# Patient Record
Sex: Male | Born: 1965 | Race: Black or African American | Hispanic: No | Marital: Single | Smoking: Former smoker
Health system: Southern US, Community
[De-identification: ages and names within clinical notes are randomized; demographics above are authoritative.]

## PROBLEM LIST (undated history)

## (undated) DIAGNOSIS — N400 Enlarged prostate without lower urinary tract symptoms: Secondary | ICD-10-CM

## (undated) DIAGNOSIS — I82409 Acute embolism and thrombosis of unspecified deep veins of unspecified lower extremity: Secondary | ICD-10-CM

## (undated) DIAGNOSIS — E7800 Pure hypercholesterolemia, unspecified: Secondary | ICD-10-CM

## (undated) DIAGNOSIS — I2699 Other pulmonary embolism without acute cor pulmonale: Secondary | ICD-10-CM

## (undated) DIAGNOSIS — R7303 Prediabetes: Secondary | ICD-10-CM

## (undated) DIAGNOSIS — I10 Essential (primary) hypertension: Secondary | ICD-10-CM

## (undated) DIAGNOSIS — G8929 Other chronic pain: Secondary | ICD-10-CM

## (undated) HISTORY — DX: Prediabetes: R73.03

## (undated) HISTORY — PX: HERNIA REPAIR: SHX51

## (undated) HISTORY — DX: Essential (primary) hypertension: I10

## (undated) HISTORY — PX: AMPUTATION TOE: SHX6595

## (undated) HISTORY — PX: APPENDECTOMY: SHX54

---

## 1999-06-07 ENCOUNTER — Ambulatory Visit: Admission: RE | Admit: 1999-06-07 | Discharge: 1999-06-07 | Payer: Self-pay | Admitting: Occupational Medicine

## 1999-06-07 ENCOUNTER — Encounter: Payer: Self-pay | Admitting: Occupational Medicine

## 2009-12-01 ENCOUNTER — Emergency Department (HOSPITAL_COMMUNITY): Admission: EM | Admit: 2009-12-01 | Discharge: 2009-12-01 | Payer: Self-pay | Admitting: Emergency Medicine

## 2010-04-28 ENCOUNTER — Emergency Department (HOSPITAL_COMMUNITY): Admission: EM | Admit: 2010-04-28 | Discharge: 2010-04-28 | Payer: Self-pay | Admitting: Emergency Medicine

## 2011-05-13 ENCOUNTER — Encounter: Payer: Self-pay | Admitting: *Deleted

## 2011-05-13 ENCOUNTER — Emergency Department (HOSPITAL_COMMUNITY)
Admission: EM | Admit: 2011-05-13 | Discharge: 2011-05-13 | Disposition: A | Discharge status: Home / Self Care | Payer: Self-pay | Attending: Emergency Medicine | Admitting: Emergency Medicine

## 2011-05-13 DIAGNOSIS — K921 Melena: Secondary | ICD-10-CM | POA: Insufficient documentation

## 2011-05-13 DIAGNOSIS — K625 Hemorrhage of anus and rectum: Secondary | ICD-10-CM | POA: Insufficient documentation

## 2011-05-13 LAB — OCCULT BLOOD, POC DEVICE: Fecal Occult Bld: NEGATIVE

## 2011-05-13 LAB — CBC
HCT: 42.8 % (ref 39.0–52.0)
Hemoglobin: 14.4 g/dL (ref 13.0–17.0)
MCH: 30.6 pg (ref 26.0–34.0)
MCHC: 33.6 g/dL (ref 30.0–36.0)
MCV: 90.9 fL (ref 78.0–100.0)
Platelets: 163 10*3/uL (ref 150–400)
RBC: 4.71 MIL/uL (ref 4.22–5.81)
RDW: 13.2 % (ref 11.5–15.5)
WBC: 7.9 10*3/uL (ref 4.0–10.5)

## 2011-05-13 MED ORDER — FENTANYL CITRATE 0.05 MG/ML IJ SOLN
INTRAMUSCULAR | Status: AC
  Filled 2011-05-13: qty 2

## 2011-05-13 NOTE — ED Notes (Signed)
To ed for eval of rectal bleeding since Saturday. Bright red blood in toilet past BM. Feeling of tightness in abd.

## 2011-05-13 NOTE — ED Provider Notes (Signed)
History     CSN: [id number] Arrival date & time: 05/13/2011  1:00 PM   First MD Initiated Contact with Patient 05/13/11 1357      Chief Complaint  Patient presents with  . Rectal Bleeding    (Consider location/radiation/quality/duration/timing/severity/associated sxs/prior treatment) HPI Rectal bleeding 2 days ago. Initially blood on toilet paper. Blood mixed with brown stool yesterday. No rectal bleeding noted today. Patient denies any abdominal pain no lightheadedness no vomiting no rectal pain no other complaint no other associated symptoms no treatment prior to coming here presently asymptomatic History reviewed. No pertinent past medical history.  Past Surgical History  Procedure Date  . Appendectomy   . Hernia repair     History reviewed. No pertinent family history.  History  Substance Use Topics  . Smoking status: Current Everyday Smoker -- 0.5 packs/day    Types: Cigarettes  . Smokeless tobacco: Not on file  . Alcohol Use: No      Review of Systems  Constitutional: Negative.   HENT: Negative.   Respiratory: Negative.   Cardiovascular: Negative.   Gastrointestinal: Positive for blood in stool.  Musculoskeletal: Negative.   Skin: Negative.   Neurological: Negative.   Hematological: Negative.   Psychiatric/Behavioral: Negative.   All other systems reviewed and are negative.    Allergies  Review of patient's allergies indicates no known allergies.  Home Medications  No current outpatient prescriptions on file.  BP 119/62  Pulse 84  Temp(Src) 97.8 F (36.6 C) (Oral)  Resp 16  SpO2 99%  Physical Exam  Nursing note and vitals reviewed. Genitourinary: Rectum normal, prostate normal and penis normal. Guaiac negative stool.    ED Course  Procedures (including critical care time)   Labs Reviewed  POCT OCCULT BLOOD STOOL, DEVICE  CBC   No results found.   No diagnosis found.    MDM  Pt stable for d/c and out pt workup of rectal  bleeding Plan Referral  GI   Dx rectal bleeding    Doug Sou, MD 05/13/11 [phone]

## 2011-05-13 NOTE — ED Notes (Signed)
Pt states he had rectal bleeding 1 year prior but did not see PMD for eval of bleeding. Pt states since Saturday has been having bright red bloody stools. Denies hx of bleeding dx or hemmorhoids. Last meal was this AM. Denies N/V. Pts abdomen soft with positive bowel sounds and diffuse tenderness to palpation.

## 2011-08-16 ENCOUNTER — Emergency Department (HOSPITAL_COMMUNITY)
Admission: EM | Admit: 2011-08-16 | Discharge: 2011-08-16 | Disposition: A | Discharge status: Home Health | Payer: Self-pay | Attending: Emergency Medicine | Admitting: Emergency Medicine

## 2011-08-16 ENCOUNTER — Encounter (HOSPITAL_COMMUNITY): Payer: Self-pay | Admitting: Emergency Medicine

## 2011-08-16 DIAGNOSIS — M5431 Sciatica, right side: Secondary | ICD-10-CM

## 2011-08-16 DIAGNOSIS — M545 Low back pain, unspecified: Secondary | ICD-10-CM | POA: Insufficient documentation

## 2011-08-16 DIAGNOSIS — M5430 Sciatica, unspecified side: Secondary | ICD-10-CM | POA: Insufficient documentation

## 2011-08-16 DIAGNOSIS — F17200 Nicotine dependence, unspecified, uncomplicated: Secondary | ICD-10-CM | POA: Insufficient documentation

## 2011-08-16 DIAGNOSIS — M79609 Pain in unspecified limb: Secondary | ICD-10-CM | POA: Insufficient documentation

## 2011-08-16 MED ORDER — PREDNISONE 20 MG PO TABS: 40.0000 mg | ORAL_TABLET | Freq: Every day | ORAL | Status: AC

## 2011-08-16 MED ORDER — HYDROMORPHONE HCL PF 1 MG/ML IJ SOLN
1.0000 mg | Freq: Once | INTRAMUSCULAR | Status: AC
  Administered 2011-08-16: 1 mg via INTRAVENOUS
  Filled 2011-08-16: qty 1

## 2011-08-16 MED ORDER — DIAZEPAM 5 MG PO TABS: 5.0000 mg | ORAL_TABLET | Freq: Three times a day (TID) | ORAL | Status: AC | PRN

## 2011-08-16 MED ORDER — PREDNISONE 20 MG PO TABS
60.0000 mg | ORAL_TABLET | Freq: Once | ORAL | Status: AC
  Administered 2011-08-16: 60 mg via ORAL
  Filled 2011-08-16: qty 3

## 2011-08-16 MED ORDER — DIAZEPAM 5 MG PO TABS
5.0000 mg | ORAL_TABLET | Freq: Once | ORAL | Status: AC
  Administered 2011-08-16: 5 mg via ORAL
  Filled 2011-08-16: qty 1

## 2011-08-16 MED ORDER — HYDROCODONE-ACETAMINOPHEN 5-325 MG PO TABS: 1.0000 | ORAL_TABLET | ORAL | Status: AC | PRN

## 2011-08-16 NOTE — ED Notes (Signed)
Patient with back pain since yesterday am trying to get out of bed.  Patient is slow to walk and move.  Patient states he has had this before, 2 years ago.

## 2011-08-16 NOTE — ED Provider Notes (Signed)
History     CSN: [id number]  Arrival date & time 08/16/11  2026   First MD Initiated Contact with Patient 08/16/11 2110      Chief Complaint  Patient presents with  . Back Pain     Patient is a 46 y.o. male presenting with back pain. The history is provided by the patient.  Back Pain  This is a recurrent problem. The current episode started 12 to 24 hours ago. The problem occurs constantly. The problem has been gradually worsening. The pain is associated with no known injury. The pain is present in the lumbar spine. The quality of the pain is described as stabbing and shooting. The pain radiates to the right thigh. The pain is at a severity of 9/10. The pain is severe. The symptoms are aggravated by certain positions. The pain is the same all the time. Associated symptoms include leg pain. Pertinent negatives include no fever, no numbness, no abdominal pain, no bowel incontinence, no perianal numbness, no bladder incontinence, no dysuria, no paresthesias, no paresis, no tingling and no weakness.   patient onset of sharp right lower quadrant back pain this morning when he attempted to get out of bed. Pain radiates into the right posterior thigh to the level of the right knee. States pain is much more severe with any attempted movement particularly twisting and bending motions. Denies numbness or tingling or weakness of the right lower extremity. Denies recent injury. Admits he had this same type of pain a couple of years ago that resolved after a week or 10 days.  History reviewed. No pertinent past medical history.  Past Surgical History  Procedure Date  . Appendectomy   . Hernia repair     History reviewed. No pertinent family history.  History  Substance Use Topics  . Smoking status: Current Everyday Smoker -- 0.5 packs/day    Types: Cigarettes  . Smokeless tobacco: Not on file  . Alcohol Use: No      Review of Systems  Constitutional: Negative.  Negative for fever.  HENT:  Negative.   Eyes: Negative.   Respiratory: Negative.   Cardiovascular: Negative.   Gastrointestinal: Negative.  Negative for abdominal pain and bowel incontinence.  Genitourinary: Negative.  Negative for bladder incontinence and dysuria.  Musculoskeletal: Positive for back pain.  Skin: Negative.   Neurological: Negative.  Negative for tingling, weakness, numbness and paresthesias.  Hematological: Negative.   Psychiatric/Behavioral: Negative.     Allergies  Review of patient's allergies indicates no known allergies.  Home Medications  No current outpatient prescriptions on file.  BP 120/83  Pulse 74  Temp(Src) 97.7 F (36.5 C) (Oral)  Resp 16  SpO2 95%  Physical Exam  Constitutional: He is oriented to person, place, and time. He appears well-developed and well-nourished.  HENT:  Head: Normocephalic and atraumatic.  Eyes: Conjunctivae are normal.  Neck: Neck supple.  Cardiovascular: Normal rate and regular rhythm.   Pulmonary/Chest: Effort normal and breath sounds normal.  Abdominal: Soft. Bowel sounds are normal.  Musculoskeletal: Normal range of motion.       Right upper leg: He exhibits tenderness.       Legs: Neurological: He is alert and oriented to person, place, and time.  Skin: Skin is warm and dry.  Psychiatric: He has a normal mood and affect.    ED Course  Procedures   Patient reports complete relief of right lower back pain after IM and PO medications. Will plan for discharge home on prednisone, provide  a short course of muscle relaxants and medication for pain and encourage followup with PCP. Referrals provided.  Labs Reviewed - No data to display No results found.   No diagnosis found.    MDM  HPI/PE and clinical findings c/w 1. Sciatica (R) (no focal neurological findings to suggest cauda-equina syndrome)        Leanne Chang, NP 08/16/11 2234

## 2011-08-16 NOTE — Discharge Instructions (Signed)
Please read over the instructions below. Take the prednisone as directed and be sure to complete it. Take a muscle relaxer and medication for pain as directed. Avoid strenuous activity or heavy lifting of anything over 10-15 pounds for the next several days. He may benefit from cold packs or heating pad to the areas of discomfort. Followup with primary care physician (PCP) as soon as can be arranged. We have provided the referrals as discussed. See the "[facility]" number above, resource list below as well as the attached referral to assist you in getting established with a PCP.   Sciatica Sciatica is a name for lower back pain caused by pressure on the sciatic nerve. The back pain can spread to the buttocks and back of the leg. HOME CARE   Rest as much as possible.   Only take medicine as told by your doctor.   Apply cold or heat to your back as told by your doctor.   Do not bend, lift, or sit for a long time until your pain is better.   Do not do anything that makes the condition worse.   Start normal activity when the pain is better.   Keep all follow-up visits.  GET HELP RIGHT AWAY IF:   There is more pain.   There is weakness or numbness in the legs.   You cannot control when you poop (bowel movement) or pee (urinate).  MAKE SURE YOU:   Understand these instructions.   Will watch this condition.   Will get help right away if you are not doing well or get worse.  Document Released: 03/19/2008 Document Revised: 02/20/2011 Document Reviewed: 03/19/2008 [facility] Patient Information 2012 [place], [place].  RESOURCE GUIDE  Dental Problems  Patients with Medicaid: [facility] [id number]                                           [phone] [address] Phone:  [phone]                                                  Phone:  [phone]  If unable to pay or uninsured, contact:  [facility] or [facility]. to become qualified for the adult dental clinic.  Chronic Pain Problems Contact [facility]  [phone] Patients need to be referred by their primary care doctor.  Insufficient Money for Medicine Contact [employer]:  call "211" or [facility] [phone].  No Primary Care Doctor Call [facility]  [phone] Other agencies that provide inexpensive medical care    [facility]  [phone]    [facility]  [phone]    [facility]  [phone]    [facility]  [phone]    [facility]  [phone]    [facility]  [phone]  Psychological Services [facility]  [phone] [facility]  [phone] [facility] Mental [facility]   [phone] (emergency services [phone])  Substance Abuse Resources [facility]  [phone] [facility] [phone] [facility] [phone] [facility]  [phone] Residential & Outpatient Substance Abuse Program  [phone]  Abuse/Neglect [facility] Child Abuse Hotline 817-451-1696 [facility] Child Abuse Hotline (504)767-0095 (After Hours)  Emergency Shelter [facility] 9803687179  M[facility] [facility] [phone] [facility] [phone]  MRSA Hotline #:   226 428 9636    [facility] Resources  [facility]     [employer]                          [facility] Dept. [address] [place]                       [address]      [address]  Blondell Reveal Phone:  [phone]                                   Phone:  [phone]                 Phone:  [phone]  [facility] Phone:  [phone]  [facility] Child Abuse Hotline (301)170-7140 567-130-3239 (After  Hours)

## 2011-08-16 NOTE — ED Notes (Signed)
Pt ambulatory at discharge without difficulty.  Pain now rated at 2/10

## 2011-08-17 NOTE — ED Provider Notes (Signed)
Medical screening examination/treatment/procedure(s) were performed by non-physician practitioner and as supervising physician I was immediately available for consultation/collaboration. Devoria Albe, MD, FACEP   Ward Givens, MD 08/17/11 1330

## 2011-08-17 NOTE — ED Notes (Signed)
Pt reports that he has had a"huge relief from pain" after medication administration.  He denies N/V.  Currently awaiting results of all diagnostic testing.  Pt given ginger ale

## 2016-06-06 DIAGNOSIS — M2061 Acquired deformities of toe(s), unspecified, right foot: Secondary | ICD-10-CM | POA: Insufficient documentation

## 2016-06-06 DIAGNOSIS — G8929 Other chronic pain: Secondary | ICD-10-CM | POA: Insufficient documentation

## 2016-07-14 DIAGNOSIS — M205X1 Other deformities of toe(s) (acquired), right foot: Secondary | ICD-10-CM | POA: Insufficient documentation

## 2017-03-27 ENCOUNTER — Encounter (HOSPITAL_COMMUNITY): Payer: Self-pay | Admitting: Emergency Medicine

## 2017-03-27 ENCOUNTER — Emergency Department (HOSPITAL_COMMUNITY)
Admission: EM | Admit: 2017-03-27 | Discharge: 2017-03-28 | Disposition: A | Discharge status: Home / Self Care | Payer: Self-pay | Attending: Emergency Medicine | Admitting: Emergency Medicine

## 2017-03-27 DIAGNOSIS — K0889 Other specified disorders of teeth and supporting structures: Secondary | ICD-10-CM

## 2017-03-27 DIAGNOSIS — F17210 Nicotine dependence, cigarettes, uncomplicated: Secondary | ICD-10-CM | POA: Insufficient documentation

## 2017-03-27 MED ORDER — OXYCODONE-ACETAMINOPHEN 5-325 MG PO TABS
1.0000 | ORAL_TABLET | Freq: Once | ORAL | Status: AC
Start: 2017-03-27 — End: 2017-03-27
  Administered 2017-03-27: 1 via ORAL

## 2017-03-27 MED ORDER — PENICILLIN V POTASSIUM 500 MG PO TABS: 500.0000 mg | ORAL_TABLET | Freq: Three times a day (TID) | ORAL | 0 refills | Status: DC

## 2017-03-27 MED ORDER — OXYCODONE-ACETAMINOPHEN 5-325 MG PO TABS
ORAL_TABLET | ORAL | Status: AC
  Filled 2017-03-27: qty 1

## 2017-03-27 MED ORDER — PENICILLIN V POTASSIUM 250 MG PO TABS
500.0000 mg | ORAL_TABLET | Freq: Once | ORAL | Status: AC
  Administered 2017-03-27: 500 mg via ORAL
  Filled 2017-03-27: qty 2

## 2017-03-27 MED ORDER — TRAMADOL HCL 50 MG PO TABS: 50.0000 mg | ORAL_TABLET | Freq: Four times a day (QID) | ORAL | 0 refills | Status: DC | PRN

## 2017-03-27 NOTE — Discharge Instructions (Signed)
Please read and follow all provided instructions.  Your diagnoses today include:  1. Toothache     The exam and treatment you received today has been provided on an emergency basis only. This is not a substitute for complete medical or dental care.  Tests performed today include:  Vital signs. See below for your results today.   Medications prescribed:   Penicillin - antibiotic  You have been prescribed an antibiotic medicine: take the entire course of medicine even if you are feeling better. Stopping early can cause the antibiotic not to work.   Tramadol - narcotic-like pain medication  DO NOT drive or perform any activities that require you to be awake and alert because this medicine can make you drowsy.   Take any prescribed medications only as directed.  Home care instructions:  Follow any educational materials contained in this packet.  Follow-up instructions: Please follow-up with your dentist for further evaluation of your symptoms.   Dental Assistance: See attached dental referrals  Return instructions:   Please return to the Emergency Department if you experience worsening symptoms.  Please return if you develop a fever, you develop more swelling in your face or neck, you have trouble breathing or swallowing food.  Please return if you have any other emergent concerns.  Additional Information:  Your vital signs today were: BP 137/87 (BP Location: Left Arm)    Pulse 78    Temp 99 F (37.2 C) (Oral)    Resp 18    SpO2 96%  If your blood pressure (BP) was elevated above 135/85 this visit, please have this repeated by your doctor within one month. --------------

## 2017-03-27 NOTE — ED Triage Notes (Signed)
Patient reports left lower molar pain for 2 weeks unrelieved by OTC pain medications .

## 2017-03-27 NOTE — ED Provider Notes (Signed)
[facility]EM[facility]ENCY DEPT Provider Note   CSN: [id number] Arrival date & time: 03/27/17  2109     History   Chief Complaint Chief Complaint  Patient presents with  . Dental Pain    HPI Keith Potter is a 51 y.o. male.  Patient presents with left-sided lower dental pain for the past 2 weeks, intermittent at first, now more constant with associated subjective swelling. Pain began gradually. He has been using over-the-counter medications without relief. No neck swelling or difficulty breathing. Patient does not have a dentist.      History reviewed. No pertinent past medical history.  There are no active problems to display for this patient.   Past Surgical History:  Procedure Laterality Date  . APPENDECTOMY    . HERNIA REPAIR         Home Medications    Prior to Admission medications   Medication Sig Start Date End Date Taking? Authorizing Provider  penicillin v potassium (VEETID) 500 MG tablet Take 1 tablet (500 mg total) by mouth 3 (three) times daily. 03/27/17   Renne Crigler, PA-C  traMADol (ULTRAM) 50 MG tablet Take 1 tablet (50 mg total) by mouth every 6 (six) hours as needed. 03/27/17   Renne Crigler, PA-C    Family History No family history on file.  Social History Social History  Substance Use Topics  . Smoking status: Current Every Day Smoker    Packs/day: 0.50    Types: Cigarettes  . Smokeless tobacco: Never Used  . Alcohol use No     Allergies   Patient has no known allergies.   Review of Systems Review of Systems  Constitutional: Negative for fever.  HENT: Positive for dental problem and facial swelling. Negative for ear pain, sore throat and trouble swallowing.   Respiratory: Negative for shortness of breath and stridor.   Musculoskeletal: Negative for neck pain.  Skin: Negative for color change.  Neurological: Negative for headaches.     Physical Exam Updated Vital Signs BP 137/87 (BP Location: Left Arm)   Pulse 78   Temp 99 F  (37.2 C) (Oral)   Resp 18   SpO2 96%   Physical Exam  Constitutional: He appears well-developed and well-nourished.  HENT:  Head: Normocephalic and atraumatic.  Right Ear: Tympanic membrane, external ear and ear canal normal.  Left Ear: Tympanic membrane, external ear and ear canal normal.  Nose: Nose normal.  Mouth/Throat: Uvula is midline, oropharynx is clear and moist and mucous membranes are normal. No trismus in the jaw. Abnormal dentition. Dental caries present. No dental abscesses or uvula swelling. No tonsillar abscesses.  Patient with advanced periodontal disease with multiple missing teeth. Tooth #18 is broken. There is no gross abscess or swelling noted.  Eyes: Pupils are equal, round, and reactive to light.  Neck: Normal range of motion. Neck supple.  No neck swelling or Lugwig's angina  Neurological: He is alert.  Skin: Skin is warm and dry.  Psychiatric: He has a normal mood and affect.  Nursing note and vitals reviewed.    ED Treatments / Results  Labs (all labs ordered are listed, but only abnormal results are displayed) Labs Reviewed - No data to display  EKG  EKG Interpretation None       Radiology No results found.  Procedures Procedures (including critical care time)  Medications Ordered in ED Medications  oxyCODONE-acetaminophen (PERCOCET/ROXICET) 5-325 MG per tablet (not administered)  penicillin v potassium (VEETID) tablet 500 mg (not administered)  oxyCODONE-acetaminophen (PERCOCET/ROXICET) 5-325 MG per  tablet 1 tablet (1 tablet Oral Given 03/27/17 2126)     Initial Impression / Assessment and Plan / ED Course  I have reviewed the triage vital signs and the nursing notes.  Pertinent labs & imaging results that were available during my care of the patient were reviewed by me and considered in my medical decision making (see chart for details).     11:16 PM Patient seen and examined. Medications ordered.   Vital signs reviewed and are as  follows: BP 137/87 (BP Location: Left Arm)   Pulse 78   Temp 99 F (37.2 C) (Oral)   Resp 18   SpO2 96%   Patient counseled on use of narcotic pain medications. rged not to drink alcohol, drive, or perform any other activities that requires focus while taking these medications. The patient verbalizes understanding and agrees with the plan.  Patient counseled to take prescribed medications as directed, return with worsening facial or neck swelling, and to follow-up with their dentist as soon as possible.    Final Clinical Impressions(s) / ED Diagnoses   Final diagnoses:  Toothache   Patient with toothache. No fever. Exam unconcerning for Ludwig's angina or other deep tissue infection in neck.   Will treat with antibiotic and pain medicine. Urged patient to follow-up with dentist.      New Prescriptions New Prescriptions   PENICILLIN V POTASSIUM (VEETID) 500 MG TABLET    Take 1 tablet (500 mg total) by mouth 3 (three) times daily.   TRAMADOL (ULTRAM) 50 MG TABLET    Take 1 tablet (50 mg total) by mouth every 6 (six) hours as needed.     Renne Crigler, PA-C 03/27/17 2316    Arby Barrette, MD 03/27/17 [phone]

## 2017-04-29 ENCOUNTER — Encounter: Payer: Self-pay | Admitting: Pediatric Intensive Care

## 2017-05-22 NOTE — Congregational Nurse Program (Signed)
Congregational Nurse Program Note  Date of Encounter: 04/29/2017  Past Medical History: No past medical history on file.  Encounter Details: CNP Questionnaire - 04/29/17 1000      Questionnaire   Patient Status  Not Applicable    Race  Black or African American    Location Patient Served At  [facility]    Uninsured  Uninsured (NEW 1x/quarter)    Food  No food insecurities    Housing/Utilities  No permanent housing    Transportation  Yes, need transportation assistance;Within past 12 months, lack of transportation negatively impacted life    Interpersonal Safety  Yes, feel physically and emotionally safe where you currently live    Medication  No medication insecurities    Medical Provider  Yes    Referrals  Not Applicable    ED Visit Averted  Not Applicable    Life-Saving Intervention Made  Not Applicable       New client- 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 He states he is pending disability. Client requests an orange sticker so he can stay in day room. CN states that she will make request to shelter staff.

## 2018-12-28 ENCOUNTER — Emergency Department (HOSPITAL_BASED_OUTPATIENT_CLINIC_OR_DEPARTMENT_OTHER): Payer: Self-pay

## 2018-12-28 ENCOUNTER — Emergency Department (HOSPITAL_COMMUNITY): Payer: Self-pay

## 2018-12-28 ENCOUNTER — Other Ambulatory Visit: Payer: Self-pay

## 2018-12-28 ENCOUNTER — Observation Stay (HOSPITAL_COMMUNITY)
Admission: EM | Admit: 2018-12-28 | Discharge: 2018-12-29 | Disposition: A | Discharge status: Home / Self Care | Payer: Self-pay | Attending: Internal Medicine | Admitting: Internal Medicine

## 2018-12-28 ENCOUNTER — Encounter (HOSPITAL_COMMUNITY): Payer: Self-pay | Admitting: *Deleted

## 2018-12-28 DIAGNOSIS — Z833 Family history of diabetes mellitus: Secondary | ICD-10-CM | POA: Insufficient documentation

## 2018-12-28 DIAGNOSIS — I82401 Acute embolism and thrombosis of unspecified deep veins of right lower extremity: Secondary | ICD-10-CM | POA: Insufficient documentation

## 2018-12-28 DIAGNOSIS — D696 Thrombocytopenia, unspecified: Secondary | ICD-10-CM

## 2018-12-28 DIAGNOSIS — R52 Pain, unspecified: Secondary | ICD-10-CM

## 2018-12-28 DIAGNOSIS — D72829 Elevated white blood cell count, unspecified: Secondary | ICD-10-CM

## 2018-12-28 DIAGNOSIS — Z1159 Encounter for screening for other viral diseases: Secondary | ICD-10-CM | POA: Insufficient documentation

## 2018-12-28 DIAGNOSIS — Z8249 Family history of ischemic heart disease and other diseases of the circulatory system: Secondary | ICD-10-CM | POA: Insufficient documentation

## 2018-12-28 DIAGNOSIS — Z79899 Other long term (current) drug therapy: Secondary | ICD-10-CM | POA: Insufficient documentation

## 2018-12-28 DIAGNOSIS — R7303 Prediabetes: Secondary | ICD-10-CM | POA: Insufficient documentation

## 2018-12-28 DIAGNOSIS — Z89411 Acquired absence of right great toe: Secondary | ICD-10-CM

## 2018-12-28 DIAGNOSIS — I2699 Other pulmonary embolism without acute cor pulmonale: Principal | ICD-10-CM | POA: Insufficient documentation

## 2018-12-28 DIAGNOSIS — Z560 Unemployment, unspecified: Secondary | ICD-10-CM | POA: Insufficient documentation

## 2018-12-28 DIAGNOSIS — Z9889 Other specified postprocedural states: Secondary | ICD-10-CM

## 2018-12-28 DIAGNOSIS — F17210 Nicotine dependence, cigarettes, uncomplicated: Secondary | ICD-10-CM | POA: Insufficient documentation

## 2018-12-28 DIAGNOSIS — M7989 Other specified soft tissue disorders: Secondary | ICD-10-CM

## 2018-12-28 DIAGNOSIS — Z86718 Personal history of other venous thrombosis and embolism: Secondary | ICD-10-CM | POA: Insufficient documentation

## 2018-12-28 DIAGNOSIS — E785 Hyperlipidemia, unspecified: Secondary | ICD-10-CM | POA: Insufficient documentation

## 2018-12-28 DIAGNOSIS — Z89421 Acquired absence of other right toe(s): Secondary | ICD-10-CM

## 2018-12-28 LAB — LIPID PANEL
Cholesterol: 214 mg/dL — ABNORMAL HIGH (ref 0–200)
HDL: 39 mg/dL — ABNORMAL LOW (ref >40)
LDL Cholesterol: 160 mg/dL — ABNORMAL HIGH (ref 0–99)
Total CHOL/HDL Ratio: 5.5 RATIO
Triglycerides: 75 mg/dL (ref <150)
VLDL: 15 mg/dL (ref 0–40)

## 2018-12-28 LAB — CBC WITH DIFFERENTIAL/PLATELET
Abs Immature Granulocytes: 0.05 10*3/uL (ref 0.00–0.07)
Basophils Absolute: 0.1 10*3/uL (ref 0.0–0.1)
Basophils Relative: 0 %
Eosinophils Absolute: 0.3 10*3/uL (ref 0.0–0.5)
Eosinophils Relative: 2 %
HCT: 44.5 % (ref 39.0–52.0)
Hemoglobin: 14.8 g/dL (ref 13.0–17.0)
Immature Granulocytes: 0 %
Lymphocytes Relative: 24 %
Lymphs Abs: 2.9 10*3/uL (ref 0.7–4.0)
MCH: 31.4 pg (ref 26.0–34.0)
MCHC: 33.3 g/dL (ref 30.0–36.0)
MCV: 94.3 fL (ref 80.0–100.0)
Monocytes Absolute: 1 10*3/uL (ref 0.1–1.0)
Monocytes Relative: 9 %
Neutro Abs: 7.6 10*3/uL (ref 1.7–7.7)
Neutrophils Relative %: 65 %
Platelets: 97 10*3/uL — ABNORMAL LOW (ref 150–400)
RBC: 4.72 MIL/uL (ref 4.22–5.81)
RDW: 12.4 % (ref 11.5–15.5)
WBC: 11.9 10*3/uL — ABNORMAL HIGH (ref 4.0–10.5)
nRBC: 0 % (ref 0.0–0.2)

## 2018-12-28 LAB — BASIC METABOLIC PANEL
Anion gap: 6 (ref 5–15)
BUN: 14 mg/dL (ref 6–20)
CO2: 24 mmol/L (ref 22–32)
Calcium: 9.2 mg/dL (ref 8.9–10.3)
Chloride: 110 mmol/L (ref 98–111)
Creatinine, Ser: 0.96 mg/dL (ref 0.61–1.24)
GFR calc Af Amer: >60 mL/min (ref >60)
GFR calc non Af Amer: >60 mL/min (ref >60)
Glucose, Bld: 87 mg/dL (ref 70–99)
Potassium: 4.1 mmol/L (ref 3.5–5.1)
Sodium: 140 mmol/L (ref 135–145)

## 2018-12-28 LAB — APTT: aPTT: 188 seconds (ref 24–36)

## 2018-12-28 LAB — ANTITHROMBIN III: AntiThromb III Func: 94 % (ref 75–120)

## 2018-12-28 LAB — SARS CORONAVIRUS 2 BY RT PCR (HOSPITAL ORDER, PERFORMED IN ~~LOC~~ HOSPITAL LAB): SARS Coronavirus 2: NEGATIVE

## 2018-12-28 LAB — HEMOGLOBIN A1C
Hgb A1c MFr Bld: 5.7 % — ABNORMAL HIGH (ref 4.8–5.6)
Mean Plasma Glucose: 116.89 mg/dL

## 2018-12-28 LAB — PROTIME-INR
INR: 1.3 — ABNORMAL HIGH (ref 0.8–1.2)
Prothrombin Time: 16.5 seconds — ABNORMAL HIGH (ref 11.4–15.2)

## 2018-12-28 MED ORDER — IOHEXOL 350 MG/ML SOLN
75.0000 mL | Freq: Once | INTRAVENOUS | Status: AC | PRN
  Administered 2018-12-28: 75 mL via INTRAVENOUS

## 2018-12-28 MED ORDER — HEPARIN (PORCINE) 25000 UT/250ML-% IV SOLN
1650.0000 [IU]/h | INTRAVENOUS | Status: DC
  Administered 2018-12-28: 1650 [IU]/h via INTRAVENOUS
  Filled 2018-12-28 (×2): qty 250

## 2018-12-28 MED ORDER — HEPARIN BOLUS VIA INFUSION
6000.0000 [IU] | Freq: Once | INTRAVENOUS | Status: AC
  Administered 2018-12-28: 6000 [IU] via INTRAVENOUS
  Filled 2018-12-28: qty 6000

## 2018-12-28 MED ORDER — OXYCODONE-ACETAMINOPHEN 5-325 MG PO TABS
1.0000 | ORAL_TABLET | Freq: Once | ORAL | Status: AC
Start: 2018-12-28 — End: 2018-12-28
  Administered 2018-12-28: 1 via ORAL
  Filled 2018-12-28: qty 1

## 2018-12-28 NOTE — ED Notes (Signed)
Vascular at bedside

## 2018-12-28 NOTE — ED Provider Notes (Addendum)
[facility] Provider Note   CSN: [id number] Arrival date & time: 12/28/18  1140    History   Chief Complaint Chief Complaint  Patient presents with  . Leg Swelling    HPI Keith Potter is a 53 y.o. male.     HPI   53 year old male presents today with swelling to his right lower extremity.  Patient notes 2 days ago he noticed swelling and edema to his right calf.  He notes pain to this area as well, pain with ambulation.  He denies any loss of distal sensation strength or motor function no color change to his feet or toes.  He notes a history DVT previously approximately 5 years ago from unknown etiology.  He notes this feels similar.  He denies any recent prolonged immobilization, cancer, trauma, surgery, or any other significant risk factors for DVT.  Patient notes he had a minor cough over the last couple of days with minimal shortness of breath.  He denies any chest pain.  He denies any fever.  He notes taking NyQuil improved his upper respiratory complaints.    No past medical history on file.  Patient Active Problem List   Diagnosis Date Noted  . Pulmonary embolism ([place]) 12/28/2018    Past Surgical History:  Procedure Laterality Date  . APPENDECTOMY    . HERNIA REPAIR          Home Medications    Prior to Admission medications   Medication Sig Start Date End Date Taking? Authorizing Provider  penicillin v potassium (VEETID) 500 MG tablet Take 1 tablet (500 mg total) by mouth 3 (three) times daily. 03/27/17   Carlisle Cater, PA-C  traMADol (ULTRAM) 50 MG tablet Take 1 tablet (50 mg total) by mouth every 6 (six) hours as needed. 03/27/17   Carlisle Cater, PA-C    Family History No family history on file.  Social History Social History   Tobacco Use  . Smoking status: Current Every Day Smoker    Packs/day: 0.50    Types: Cigarettes  . Smokeless tobacco: Never Used  Substance Use Topics  . Alcohol use: No  . Drug use:  Yes    Types: Marijuana     Allergies   Patient has no known allergies.   Review of Systems Review of Systems  All other systems reviewed and are negative.    Physical Exam Updated Vital Signs BP 127/71   Pulse 79   Temp 98.2 F (36.8 C) (Oral)   Resp 16   Ht 6' (1.829 m)   Wt 102.1 kg   SpO2 98%   BMI 30.52 kg/m   Physical Exam Vitals signs and nursing note reviewed.  Constitutional:      Appearance: He is well-developed.  HENT:     Head: Normocephalic and atraumatic.  Eyes:     General: No scleral icterus.       Right eye: No discharge.        Left eye: No discharge.     Conjunctiva/sclera: Conjunctivae normal.     Pupils: Pupils are equal, round, and reactive to light.  Neck:     Musculoskeletal: Normal range of motion.     Vascular: No JVD.     Trachea: No tracheal deviation.  Cardiovascular:     Rate and Rhythm: Normal rate and regular rhythm.  Pulmonary:     Effort: Pulmonary effort is normal. No respiratory distress.     Breath sounds: Normal breath sounds. No stridor.  No wheezing, rhonchi or rales.  Musculoskeletal:     Comments: Swelling noted to the right calf with tenderness-no erythema noted, pedal pulse 2+, sensation intact-cap refill less than 2 seconds  Neurological:     Mental Status: He is alert and oriented to person, place, and time.     Coordination: Coordination normal.  Psychiatric:        Behavior: Behavior normal.        Thought Content: Thought content normal.        Judgment: Judgment normal.      ED Treatments / Results  Labs (all labs ordered are listed, but only abnormal results are displayed) Labs Reviewed  CBC WITH DIFFERENTIAL/PLATELET - Abnormal; Notable for the following components:      Result Value   WBC 11.9 (*)    Platelets 97 (*)    All other components within normal limits  SARS CORONAVIRUS 2 (HOSPITAL ORDER, PERFORMED IN [facility])  BASIC METABOLIC PANEL  HEPARIN LEVEL (UNFRACTIONATED)   CBC    EKG None  ED EKG normal sinus rhythm  Radiology Ct Angio Chest Pe W And/or Wo Contrast  Result Date: 12/28/2018 CLINICAL DATA:  Right leg pain for 2 days with shortness of breath, initial encounter EXAM: CT ANGIOGRAPHY CHEST WITH CONTRAST TECHNIQUE: Multidetector CT imaging of the chest was performed using the standard protocol during bolus administration of intravenous contrast. Multiplanar CT image reconstructions and MIPs were obtained to evaluate the vascular anatomy. CONTRAST:  75mL OMNIPAQUE IOHEXOL 350 MG/ML SOLN COMPARISON:  03/17/2016 FINDINGS: Cardiovascular: Thoracic aorta is well visualize without evidence of aneurysmal dilatation or dissection. Pulmonary artery demonstrates a normal branching pattern with large bilateral filling defects centrally consistent with pulmonary embolism. No evidence of right heart strain is noted. No coronary calcifications are seen. Mild cardiac enlargement is noted. Mediastinum/Nodes: Thoracic inlet is within normal limits. No hilar or mediastinal adenopathy is noted. The esophagus is within normal limits. Lungs/Pleura: Mild emphysematous changes are seen. No focal infiltrate or sizable effusion is noted. No pulmonary infarct is seen. Upper Abdomen: Visualized upper abdomen is unremarkable. Musculoskeletal: Mild degenerative changes of the thoracic spine are seen. No rib abnormality is noted. Review of the MIP images confirms the above findings. IMPRESSION: Bilateral pulmonary emboli without evidence of right heart strain. Critical Value/emergent results were called by telephone at the time of interpretation on 12/28/2018 at 6:24 pm to Braelyn Bordonaro, PA , who verbally acknowledged these results. Electronically Signed   By: Alcide CleverMark  Lukens M.D.   On: 12/28/2018 18:26   [place] Lower Extremity Venous (dvt) (only [facility])  Result Date: 12/28/2018  Lower Venous Study Indications: Pain, and Swelling.  Risk Factors: DVT Personal history of DVT 5 years ago.  Comparison Study: No prior study on fiel for comparison Performing Technologist: Sherren Kernsandace Kanady RVS  Examination Guidelines: A complete evaluation includes B-mode imaging, spectral Doppler, color Doppler, and power Doppler as needed of all accessible portions of each vessel. Bilateral testing is considered an integral part of a complete examination. Limited examinations for reoccurring indications may be performed as noted.  +----------+---------------+---------+-----------+----------+-------+ RIGHT     CompressibilityPhasicitySpontaneityPropertiesSummary +----------+---------------+---------+-----------+----------+-------+ CFV       None           No       No                   Acute   +----------+---------------+---------+-----------+----------+-------+ SFJ       None  Acute   +----------+---------------+---------+-----------+----------+-------+ FV Prox   None                                         Acute   +----------+---------------+---------+-----------+----------+-------+ FV Mid    None                                         Acute   +----------+---------------+---------+-----------+----------+-------+ FV Distal None                                         Acute   +----------+---------------+---------+-----------+----------+-------+ PFV       None                                         Acute   +----------+---------------+---------+-----------+----------+-------+ POP       None           No       No                   Acute   +----------+---------------+---------+-----------+----------+-------+ PTV       None                                         Acute   +----------+---------------+---------+-----------+----------+-------+ PERO      None                                         Acute   +----------+---------------+---------+-----------+----------+-------+ Gastroc   None                                          Acute   +----------+---------------+---------+-----------+----------+-------+ CIV distal               No       No                   Acute   +----------+---------------+---------+-----------+----------+-------+   +----+---------------+---------+-----------+----------+-------+ LEFTCompressibilityPhasicitySpontaneityPropertiesSummary +----+---------------+---------+-----------+----------+-------+ CFV Full           Yes      Yes                          +----+---------------+---------+-----------+----------+-------+     Summary: Right: Findings consistent with acute deep vein thrombosis involving the right common femoral vein, right femoral vein, right proximal profunda vein, right popliteal vein, right posterior tibial veins, right peroneal veins, and right gastrocnemius veins.  Positive in the distal common iliac vein. Left: No evidence of common femoral vein obstruction.  *See table(s) above for measurements and observations.    Preliminary     Procedures Procedures (including critical care time)  CRITICAL CARE Performed by: Kelle DartingJeffrey Todd Sharen Youngren   Total critical care time: 35 minutes  Critical care time was exclusive of separately  billable procedures and treating other patients.  Critical care was necessary to treat or prevent imminent or life-threatening deterioration.  Critical care was time spent personally by me on the following activities: development of treatment plan with patient and/or surrogate as well as nursing, discussions with consultants, evaluation of patient's response to treatment, examination of patient, obtaining history from patient or surrogate, ordering and performing treatments and interventions, ordering and review of laboratory studies, ordering and review of radiographic studies, pulse oximetry and re-evaluation of patient's condition.  Medications Ordered in ED Medications  heparin ADULT infusion 100 units/mL (25000 units/27350mL sodium  chloride 0.45%) (1,650 Units/hr Intravenous New Bag/Given 12/28/18 1858)  oxyCODONE-acetaminophen (PERCOCET/ROXICET) 5-325 MG per tablet 1 tablet (1 tablet Oral Given 12/28/18 1717)  iohexol (OMNIPAQUE) 350 MG/ML injection 75 mL (75 mLs Intravenous Contrast Given 12/28/18 1812)  heparin bolus via infusion 6,000 Units (6,000 Units Intravenous Bolus from Bag 12/28/18 1859)     Initial Impression / Assessment and Plan / ED Course  I have reviewed the triage vital signs and the nursing notes.  Pertinent labs & imaging results that were available during my care of the patient were reviewed by me and considered in my medical decision making (see chart for details).        Labs: CBC, BMP  Imaging: CT angios chest PE with and without  Consults: Internal medicine  Therapeutics: Heparin  Discharge Meds:   Assessment/Plan: 53 year old male presents today with pulmonary embolism.  Patient has very extensive clot burden to his right lower extremity, bilateral PEs.  Given the extensive nature I do find it reasonable for the patient to be brought in and started on heparin.  Gust case with internal medicine who agrees for hospital admission.  Patient has remained stable while here in the ED.      Final Clinical Impressions(s) / ED Diagnoses   Final diagnoses:  Bilateral pulmonary embolism (HCC)  Deep vein thrombosis (DVT) of right lower extremity, unspecified chronicity, unspecified vein [facility](HCC)    ED Discharge Orders    None       Rosalio LoudHedges, Ahmar Pickrell, PA-C 12/28/18 1934    Gerhard MunchLockwood, Robert, MD 12/29/18 [id number]    Eyvonne MechanicHedges, Semaj Kham, PA-C 03/06/19 1101    Gerhard MunchLockwood, Robert, MD 03/08/19 1059

## 2018-12-28 NOTE — ED Triage Notes (Signed)
Pt c.o right leg pain / swelling x 2 days ago ; denies any trauma to the area ; pt states he had blood clots in both legs x5 years ago and states " it feels the same "

## 2018-12-28 NOTE — H&P (Signed)
Date: 12/28/2018               Patient Name:  Keith SpragueRonald Potter MRN: [id number]  DOB: 29-Dec-1965 Age / Sex: 53 y.o., male   PCP: Patient, No Pcp Per         Medical Service: Internal Medicine Teaching Service         Attending Physician: Dr. Sandre Kittyaines, Elwin MochaAlexander N, MD    First Contact: Dr. Thurmon FairJeff Steen Pager: [phone]  Second Contact: Dr. Ginette OttoAlec Melvin Pager: [phone]       After Hours (After 5p/  First Contact Pager: [phone]  weekends / holidays): Second Contact Pager: [phone]   Chief Complaint: pain in R calf  History of Present Illness: Keith Potter is a 53 year old male with a significant PMH of bilateral DVTs who presented to the ED with a 3 day history of R lower leg pain and swelling. Pt noticed the pain as coming on gradually and describes it as a tightness/soreness in the R calf. He states this pain is similar to his symptoms 5 years ago when he had bilateral DVTs. At that time he was started on Xarelto, but didn't refill that prescription after leaving the hospital. At this time, he denies any recent long car rides or sitting for long periods of time. Endorses a recent cough and shortness of breath that began around the same time as the leg pain. Pt thought the cough was due to his smoking history though tried taking Nyquil at home. He takes no other medications except for ibuprofen for back pain.   Pt states he has no significant PMH, denying lung disease, diabetes, or HTN. He last saw a physician two years ago for back pain, but doesn't have a PCP.  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  ROS - Endorses shortness of breath, cough, and palpitations. Denies chest pain, fevers, chills, headaches, dizziness, vision changes, abdominal pain, or nausea.  In the ED - Patient was found to have an leukocytosis (11.9) and thrombocytopenia (97). CTA revealed bilateral pulmonary emboli without evidence of  right heart strain. Pt was given heparin bolus of 6,000 units and started on heparin infusion 1650 units/hr. Also given 1 time does of oxycodone-acetaminophen for pain.  Meds:  No current facility-administered medications on file prior to encounter.    Current Outpatient Medications on File Prior to Encounter  Medication Sig Dispense Refill  . penicillin v potassium (VEETID) 500 MG tablet Take 1 tablet (500 mg total) by mouth 3 (three) times daily. 21 tablet 0  . traMADol (ULTRAM) 50 MG tablet Take 1 tablet (50 mg total) by mouth every 6 (six) hours as needed. 15 tablet 0    Allergies: Allergies as of 12/28/2018  . (No Known Allergies)   No past medical history on file.  Family History: History of diabetes in mother, father, and sister. Mother with ESRD on dialysis. Sister with heart disease. No family history of hypertension. Cancer in aunt and uncle (leukemia, passed away in April of this year).  Social History: Current smoker (1 pack/day for 30 years), denies alcohol use, uses marijuana. Lives alone at home. 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  Review of Systems: A complete ROS was negative except as per HPI.   Physical Exam: Blood pressure 127/71, pulse 79, temperature 98.2 F (36.8 C), temperature source Oral, resp. rate 16, height  6' (1.829 m), weight 102.1 kg, SpO2 98 %. Physical Exam Vitals signs reviewed.  Constitutional:      General: He is not in acute distress. HENT:     Head: Normocephalic and atraumatic.     Mouth/Throat:     Mouth: Mucous membranes are moist.  Eyes:     Extraocular Movements: Extraocular movements intact.  Cardiovascular:     Rate and Rhythm: Normal rate and regular rhythm.     Pulses:          Dorsalis pedis pulses are 2+ on the right side and 2+ on the left side.     Heart sounds: Normal heart sounds. No murmur. No friction rub. No gallop.   Pulmonary:     Effort: Pulmonary effort is normal. No  respiratory distress.     Breath sounds: No wheezing, rhonchi or rales.  Abdominal:     General: Bowel sounds are normal. There is no distension.     Palpations: Abdomen is soft.     Tenderness: There is no abdominal tenderness.  Musculoskeletal:     Comments: Swelling and tenderness of the R lower extremity. No erythema.  Feet:     Right foot:     Skin integrity: Skin integrity normal.     Left foot:     Skin integrity: Skin integrity normal.     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 Skin:    General: Skin is warm and dry.  Neurological:     General: No focal deficit present.     Mental Status: He is alert and oriented to person, place, and time.  Psychiatric:        Mood and Affect: Mood normal.    EKG: personally reviewed my interpretation is normal rate, sinus rhythm, no S1Q3T3 pattern.  Assessment & Plan by Problem:  Keith Potter is a 53 year old male with a significant PMH for bilateral DVTs 5 years ago coming in today with R leg pain and swelling with a CTA positive for bilateral pulmonary emboli.   Unprovoked Pulmonary Embolism - pt currently hemodynamically stable and on heparin infusion - pharm consult for heparin therapy - follow up heparin level and CBC daily - ordered PT, PTT, and INR - coag workup   antiphospholipid  antithrombin III  Factor 5   homocysteine   JAK2  Protein C and S  prothrombin - on cardiac monitoring - monitor for symptoms of bleeding  pt without PCP and has extensive family history of diabetes, CAD, and HTN - will order labs for risk stratification - order A1c - order lipid panel    Diet - regular adult diet DVT ppx  - heparin infusion 1650 units/hr Fluids - none CODE STATUS - FULL CODE  Dispo: Admit patient to Observation with expected length of stay less than 2 midnights  Signed: Ladona Horns, MD 12/28/2018, 7:41 PM  Pager: [phone]

## 2018-12-28 NOTE — ED Notes (Signed)
ED TO INPATIENT HANDOFF REPORT  ED Nurse Name and Phone #: Claiborne Billings [phone]  S Name/Age/Gender Keith Potter 53 y.o. male Room/Bed: 002C/002C  Code Status   Code Status: Not on file  Home/SNF/Other Home Patient oriented to: self, place, time and situation Is this baseline? Yes   Triage Complete: Triage complete  Chief Complaint Right leg pain  Triage Note Pt c.o right leg pain / swelling x 2 days ago ; denies any trauma to the area ; pt states he had blood clots in both legs x5 years ago and states " it feels the same "    Allergies No Known Allergies  Level of Care/Admitting Diagnosis ED Disposition    ED Disposition Condition Comment   Admit  Hospital Area: [place] [[id number]]  Level of Care: Telemetry Medical [104]  Covid Evaluation: Asymptomatic Screening Protocol (No Symptoms)  Diagnosis: Pulmonary embolism [facility]) [355732]  Admitting Physician: Oda Kilts [[id number]]  Attending Physician: Oda Kilts [phone]  PT Class (Do Not Modify): Observation [104]  PT Acc Code (Do Not Modify): Observation [10022]       B Medical/Surgery History No past medical history on file. Past Surgical History:  Procedure Laterality Date  . APPENDECTOMY    . HERNIA REPAIR       A IV Location/Drains/Wounds Patient Lines/Drains/Airways Status   Active Line/Drains/Airways    Name:   Placement date:   Placement time:   Site:   Days:   Peripheral IV 12/28/18 Right Antecubital   12/28/18    1533    Antecubital   less than 1          Intake/Output Last 24 hours No intake or output data in the 24 hours ending 12/28/18 1927  Labs/Imaging Results for orders placed or performed during the hospital encounter of 12/28/18 (from the past 48 hour(s))  CBC with Differential     Status: Abnormal   Collection Time: 12/28/18  3:34 PM  Result Value Ref Range   WBC 11.9 (H) 4.0 - 10.5 K/uL   RBC 4.72 4.22 - 5.81 MIL/uL   Hemoglobin 14.8 13.0 - 17.0 g/dL    HCT 44.5 39.0 - 52.0 %   MCV 94.3 80.0 - 100.0 fL   MCH 31.4 26.0 - 34.0 pg   MCHC 33.3 30.0 - 36.0 g/dL   RDW 12.4 11.5 - 15.5 %   Platelets 97 (L) 150 - 400 K/uL    Comment: REPEATED TO VERIFY PLATELET COUNT CONFIRMED BY SMEAR SPECIMEN CHECKED FOR CLOTS Immature Platelet Fraction may be clinically indicated, consider ordering this additional test [id number]    nRBC 0.0 0.0 - 0.2 %   Neutrophils Relative % 65 %   Neutro Abs 7.6 1.7 - 7.7 K/uL   Lymphocytes Relative 24 %   Lymphs Abs 2.9 0.7 - 4.0 K/uL   Monocytes Relative 9 %   Monocytes Absolute 1.0 0.1 - 1.0 K/uL   Eosinophils Relative 2 %   Eosinophils Absolute 0.3 0.0 - 0.5 K/uL   Basophils Relative 0 %   Basophils Absolute 0.1 0.0 - 0.1 K/uL   Immature Granulocytes 0 %   Abs Immature Granulocytes 0.05 0.00 - 0.07 K/uL    Comment: Performed at [facility], [address] [place], [place] [address]  Basic metabolic panel     Status: None   Collection Time: 12/28/18  3:34 PM  Result Value Ref Range   Sodium 140 135 - 145 mmol/L   Potassium 4.1 3.5 -  5.1 mmol/L   Chloride 110 98 - 111 mmol/L   CO2 24 22 - 32 mmol/L   Glucose, Bld 87 70 - 99 mg/dL   BUN 14 6 - 20 mg/dL   Creatinine, Ser 1.610.96 0.61 - 1.24 mg/dL   Calcium 9.2 8.9 - 09.610.3 mg/dL   GFR calc non Af Amer >60 >60 mL/min   GFR calc Af Amer >60 >60 mL/min   Anion gap 6 5 - 15    Comment: Performed at [facility], [address] [place], [place] [address]   Ct Angio Chest Pe W And/or Wo Contrast  Result Date: 12/28/2018 CLINICAL DATA:  Right leg pain for 2 days with shortness of breath, initial encounter EXAM: CT ANGIOGRAPHY CHEST WITH CONTRAST TECHNIQUE: Multidetector CT imaging of the chest was performed using the standard protocol during bolus administration of intravenous contrast. Multiplanar CT image reconstructions and MIPs were obtained to evaluate the vascular anatomy. CONTRAST:  75mL OMNIPAQUE IOHEXOL 350 MG/ML SOLN COMPARISON:  03/17/2016  FINDINGS: Cardiovascular: Thoracic aorta is well visualize without evidence of aneurysmal dilatation or dissection. Pulmonary artery demonstrates a normal branching pattern with large bilateral filling defects centrally consistent with pulmonary embolism. No evidence of right heart strain is noted. No coronary calcifications are seen. Mild cardiac enlargement is noted. Mediastinum/Nodes: Thoracic inlet is within normal limits. No hilar or mediastinal adenopathy is noted. The esophagus is within normal limits. Lungs/Pleura: Mild emphysematous changes are seen. No focal infiltrate or sizable effusion is noted. No pulmonary infarct is seen. Upper Abdomen: Visualized upper abdomen is unremarkable. Musculoskeletal: Mild degenerative changes of the thoracic spine are seen. No rib abnormality is noted. Review of the MIP images confirms the above findings. IMPRESSION: Bilateral pulmonary emboli without evidence of right heart strain. Critical Value/emergent results were called by telephone at the time of interpretation on 12/28/2018 at 6:24 pm to JEFFREY HEDGES, PA , who verbally acknowledged these results. Electronically Signed   By: Alcide CleverMark  Lukens M.D.   On: 12/28/2018 18:26   Vas [place] Lower Extremity Venous (dvt) (only Mc & Wl)  Result Date: 12/28/2018  Lower Venous Study Indications: Pain, and Swelling.  Risk Factors: DVT Personal history of DVT 5 years ago. Comparison Study: No prior study on fiel for comparison Performing Technologist: Sherren Kernsandace Kanady RVS  Examination Guidelines: A complete evaluation includes B-mode imaging, spectral Doppler, color Doppler, and power Doppler as needed of all accessible portions of each vessel. Bilateral testing is considered an integral part of a complete examination. Limited examinations for reoccurring indications may be performed as noted.  +----------+---------------+---------+-----------+----------+-------+ RIGHT     CompressibilityPhasicitySpontaneityPropertiesSummary  +----------+---------------+---------+-----------+----------+-------+ CFV       None           No       No                   Acute   +----------+---------------+---------+-----------+----------+-------+ SFJ       None                                         Acute   +----------+---------------+---------+-----------+----------+-------+ FV Prox   None                                         Acute   +----------+---------------+---------+-----------+----------+-------+ FV  Mid    None                                         Acute   +----------+---------------+---------+-----------+----------+-------+ FV Distal None                                         Acute   +----------+---------------+---------+-----------+----------+-------+ PFV       None                                         Acute   +----------+---------------+---------+-----------+----------+-------+ POP       None           No       No                   Acute   +----------+---------------+---------+-----------+----------+-------+ PTV       None                                         Acute   +----------+---------------+---------+-----------+----------+-------+ PERO      None                                         Acute   +----------+---------------+---------+-----------+----------+-------+ Gastroc   None                                         Acute   +----------+---------------+---------+-----------+----------+-------+ CIV distal               No       No                   Acute   +----------+---------------+---------+-----------+----------+-------+   +----+---------------+---------+-----------+----------+-------+ LEFTCompressibilityPhasicitySpontaneityPropertiesSummary +----+---------------+---------+-----------+----------+-------+ CFV Full           Yes      Yes                          +----+---------------+---------+-----------+----------+-------+     Summary:  Right: Findings consistent with acute deep vein thrombosis involving the right common femoral vein, right femoral vein, right proximal profunda vein, right popliteal vein, right posterior tibial veins, right peroneal veins, and right gastrocnemius veins.  Positive in the distal common iliac vein. Left: No evidence of common femoral vein obstruction.  *See table(s) above for measurements and observations.    Preliminary     Pending Labs Unresulted Labs (From admission, onward)    Start     Ordered   12/30/18 0500  Heparin level (unfractionated)  Daily,   R     12/28/18 1851   12/29/18 0500  CBC  Daily,   R     12/28/18 1851   12/29/18 0200  Heparin level (unfractionated)  Once-Timed,   STAT     12/28/18 1851   12/28/18 1844  SARS Coronavirus 2 (CEPHEID - Performed in [facility] hospital lab), Hosp Order  (Asymptomatic Patients Labs)  Once,   STAT    Question:  Rule Out  Answer:  Yes   12/28/18 1843          Vitals/Pain Today's Vitals   12/28/18 1900 12/28/18 1902 12/28/18 1903 12/28/18 1915  BP: 127/71     Pulse:  69  79  Resp:      Temp:      TempSrc:      SpO2:  98%  98%  Weight:      Height:      PainSc:   7      Isolation Precautions No active isolations  Medications Medications  heparin ADULT infusion 100 units/mL (25000 units/26550mL sodium chloride 0.45%) (1,650 Units/hr Intravenous New Bag/Given 12/28/18 1858)  oxyCODONE-acetaminophen (PERCOCET/ROXICET) 5-325 MG per tablet 1 tablet (1 tablet Oral Given 12/28/18 1717)  iohexol (OMNIPAQUE) 350 MG/ML injection 75 mL (75 mLs Intravenous Contrast Given 12/28/18 1812)  heparin bolus via infusion 6,000 Units (6,000 Units Intravenous Bolus from Bag 12/28/18 1859)    Mobility walks Low fall risk   Focused Assessments Pulmonary Assessment Handoff:  Lung sounds:   O2 Device: Room Air        R Recommendations: See Admitting Provider Note  Report given to:   Additional Notes: heparin drip infusing

## 2018-12-28 NOTE — ED Notes (Signed)
Patient transported to CT 

## 2018-12-28 NOTE — Progress Notes (Signed)
ANTICOAGULATION CONSULT NOTE - Initial Consult  Pharmacy Consult for Heparin Indication: DVT/PE  No Known Allergies  Patient Measurements: Height: 6' (182.9 cm) Weight: 225 lb (102.1 kg) IBW/kg (Calculated) : 77.6 Heparin Dosing Weight: 98.5  Vital Signs: Temp: 98.2 F (36.8 C) (07/06 1146) Temp Source: Oral (07/06 1146) BP: 115/63 (07/06 1824) Pulse Rate: 77 (07/06 1824)  Labs: Recent Labs    12/28/18 1534  HGB 14.8  HCT 44.5  PLT 97*  CREATININE 0.96    Estimated Creatinine Clearance: 107.5 mL/min (by C-G formula based on SCr of 0.96 mg/dL).   Medical History: No past medical history on file.  Assessment: Pt is a 65 YOM presenting with c/o right leg pain/swelling 2 days ago. 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 Plts 97 and Hgb 14.8. Pharmacy to dose heparin.  Goal of Therapy:  Heparin level 0.3-0.7 units/ml Monitor platelets by anticoagulation protocol: Yes   Plan:  Give heparin 6000 units IV bolus x1 and then give 1650 units/hr IV Check 6-8 hour heparin level Monitor heparin level and CBC daily Monitor for s/sx of bleed  Natale Lay 12/28/2018,6:50 PM

## 2018-12-28 NOTE — ED Notes (Signed)
Reg Dinner tray ordered per [employer]

## 2018-12-28 NOTE — Progress Notes (Signed)
CRITICAL VALUE ALERT  Critical Value:  APTT 188  Date & Time Notied: 12/28/2018 9:57 PM   Provider Notified: Ronnald Ramp  Orders Received/Actions taken: call received

## 2018-12-28 NOTE — ED Notes (Signed)
Doppler at bedside.

## 2018-12-29 ENCOUNTER — Telehealth: Payer: Self-pay

## 2018-12-29 DIAGNOSIS — Z7901 Long term (current) use of anticoagulants: Secondary | ICD-10-CM

## 2018-12-29 LAB — CBC
HCT: 43.1 % (ref 39.0–52.0)
Hemoglobin: 14.3 g/dL (ref 13.0–17.0)
MCH: 31.4 pg (ref 26.0–34.0)
MCHC: 33.2 g/dL (ref 30.0–36.0)
MCV: 94.5 fL (ref 80.0–100.0)
Platelets: 98 10*3/uL — ABNORMAL LOW (ref 150–400)
RBC: 4.56 MIL/uL (ref 4.22–5.81)
RDW: 12.5 % (ref 11.5–15.5)
WBC: 11.4 10*3/uL — ABNORMAL HIGH (ref 4.0–10.5)
nRBC: 0 % (ref 0.0–0.2)

## 2018-12-29 LAB — HEPARIN LEVEL (UNFRACTIONATED)
Heparin Unfractionated: 0.53 IU/mL (ref 0.30–0.70)
Heparin Unfractionated: 0.61 IU/mL (ref 0.30–0.70)

## 2018-12-29 LAB — HIV ANTIBODY (ROUTINE TESTING W REFLEX): HIV Screen 4th Generation wRfx: NONREACTIVE

## 2018-12-29 LAB — VITAMIN B12: Vitamin B-12: 352 pg/mL (ref 180–914)

## 2018-12-29 MED ORDER — XARELTO VTE STARTER PACK 15 & 20 MG PO TBPK: ORAL_TABLET | ORAL | 0 refills | Status: DC

## 2018-12-29 MED ORDER — OXYCODONE-ACETAMINOPHEN 5-325 MG PO TABS
1.0000 | ORAL_TABLET | ORAL | Status: AC | PRN
  Administered 2018-12-29 (×3): 1 via ORAL
  Filled 2018-12-29 (×3): qty 1

## 2018-12-29 MED FILL — XARELTO STARTER PACK: 15 & 20 | 30 days supply | Qty: 51 | Fill #0

## 2018-12-29 NOTE — Progress Notes (Signed)
Patient has just noticed his debit card is not in his possession. A debit card was not among the list of things accounted for upon patient's arrival. He reports having had it in the bed with him in the emergency department.  The patient checked his bag of belongings in the presence of this nurse; no card was found.  Security was called and they denied seeing it in lost and found.  There is no linen bag in the patient's bathroom to search.  The patient has been made aware that attempts to locate his debit card have failed, so far.

## 2018-12-29 NOTE — Progress Notes (Signed)
ANTICOAGULATION CONSULT NOTE   Pharmacy Consult for Heparin Indication: DVT/PE  No Known Allergies  Patient Measurements: Height: 6' (182.9 cm) Weight: 218 lb 11.1 oz (99.2 kg) IBW/kg (Calculated) : 77.6 Heparin Dosing Weight: 98.5  Vital Signs: Temp: 98.4 F (36.9 C) (07/06 2330) Temp Source: Oral (07/06 2330) BP: 139/80 (07/06 2330) Pulse Rate: 66 (07/06 2330)  Labs: Recent Labs    12/28/18 1534 12/28/18 2042 12/29/18 0234  HGB 14.8  --   --   HCT 44.5  --   --   PLT 97*  --   --   APTT  --  188*  --   LABPROT  --  16.5*  --   INR  --  1.3*  --   HEPARINUNFRC  --   --  0.53  CREATININE 0.96  --   --     Estimated Creatinine Clearance: 106 mL/min (by C-G formula based on SCr of 0.96 mg/dL).   Medical History: History reviewed. No pertinent past medical history.  Assessment: Pt is a 73 YOM presenting with c/o right leg pain/swelling 2 days ago. 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 Plts 97 and Hgb 14.8. Pharmacy to dose heparin.  7/7 AM update:  Initial heparin level therapeutic   Goal of Therapy:  Heparin level 0.3-0.7 units/ml Monitor platelets by anticoagulation protocol: Yes   Plan:  Cont heparin at 1650 units/hr Confirmatory heparin level at 1200 Monitor heparin level and CBC daily Monitor for s/sx of bleed  Narda Bonds, PharmD, [place] Pharmacist Phone: [phone]

## 2018-12-29 NOTE — Progress Notes (Signed)
ANTICOAGULATION CONSULT NOTE   Pharmacy Consult for Heparin Indication: DVT/PE  No Known Allergies  Patient Measurements: Height: 6' (182.9 cm) Weight: 218 lb 11.1 oz (99.2 kg) IBW/kg (Calculated) : 77.6 Heparin Dosing Weight: 98.5  Vital Signs: Temp: 98.3 F (36.8 C) (07/07 0807) Temp Source: Oral (07/07 0807) BP: 126/78 (07/07 0807) Pulse Rate: 66 (07/07 0807)  Labs: Recent Labs    12/28/18 1534 12/28/18 2042 12/29/18 0234 12/29/18 1152  HGB 14.8  --  14.3  --   HCT 44.5  --  43.1  --   PLT 97*  --  98*  --   APTT  --  188*  --   --   LABPROT  --  16.5*  --   --   INR  --  1.3*  --   --   HEPARINUNFRC  --   --  0.53 0.61  CREATININE 0.96  --   --   --     Estimated Creatinine Clearance: 106 mL/min (by C-G formula based on SCr of 0.96 mg/dL).   Medical History: History reviewed. No pertinent past medical history.  Assessment: Pt is a 4 YOM presenting with c/o right leg pain/swelling 2 days ago. 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 Plts 98 and Hgb 14.3. Pharmacy to dose heparin.  7/7: Heparin level 0.61  Goal of Therapy:  Heparin level 0.3-0.7 units/ml Monitor platelets by anticoagulation protocol: Yes   Plan:  Cont heparin at 1650 units/hr Monitor heparin level and CBC daily Monitor for s/sx of bleed  Dewitte Vannice A. Levada Dy, PharmD, [place] Pager: [phone] Please utilize Amion for appropriate phone number to reach the unit pharmacist ([place])

## 2018-12-29 NOTE — Telephone Encounter (Signed)
Hospital TOC per Dr. Melvin, discharge 12/29/2018, appt 01/07/2019. 

## 2018-12-29 NOTE — Consult Note (Signed)
Pharmacy student rounding with IMTS/B2/ O[facility] Desanct[employer] service. Requested to consult regarding anticoagulation  medication program for patient prior to discharge. Discussed medication options and available programs with Dr. Elie Confer, and completed request for affordable anticoagulant. Trans[facility] of Care Pharmacist was consulted for coupon card and eligibility for initial 30-day dispensing free of charge, for which the patient qualified. Medication was dispensed from [facility] to patient and I delivered to bedside. While at bedside, patient was permitted to ask and have answered questions they might have regarding their medication and disease state. Extensive counseling was provided regarding the recurrence of DVT including: swollen lower extremity, pain in lower extremity, chest pain, shortness of breath, increased heart rate. Also signs and symptoms of "blood being too thin" including: blood in stool, urine, vomiting blood, nose bleeds, coughing up blood, etc. After this dispensing, patient is to call Dr. Elie Confer for subsequent refills. Importance of medication compliance was stressed as well as meal requirements for medication absorption. Patient verbalized his understanding of the instructions provided.   [place]

## 2018-12-29 NOTE — Progress Notes (Signed)
   Subjective: Seen on rounds this AM, pt resting comfortably at the beside. Leg feels better and breathing fine. Mr. Cothran knows he's in the hospital for blood clots. Does not have a PCP but wants to establish care at [facility]. Has been on Xarelto in the past and tolerated it well.   Objective:  Vital signs in last 24 hours: Vitals:   12/28/18 2000 12/28/18 2034 12/28/18 2330 12/29/18 0807  BP: (!) 140/91 128/81 139/80 126/78  Pulse: 78 76 66 66  Resp:  16 14   Temp:  98.7 F (37.1 C) 98.4 F (36.9 C) 98.3 F (36.8 C)  TempSrc:  Oral Oral Oral  SpO2: 98% 100% 95% 99%  Weight:  99.2 kg    Height:  6' (1.829 m)     Physical Exam Constitutional:      Appearance: Normal appearance.  Cardiovascular:     Rate and Rhythm: Normal rate and regular rhythm.     Pulses: Normal pulses.  Pulmonary:     Effort: Pulmonary effort is normal.     Breath sounds: Normal breath sounds.  Abdominal:     General: Bowel sounds are normal.     Palpations: Abdomen is soft.  Neurological:     Mental Status: He is alert.     Assessment/Plan:  Principal Problem:   Pulmonary embolism (HCC) Active Problems:   History of DVT (deep vein thrombosis)  Mr. Goodall is a 53 year old male with a significant PMH for bilateral DVTs 5 years ago coming in today with R leg pain and swelling with a CTA positive for bilateral pulmonary emboli.   Unprovoked Pulmonary Embolism - pt currently hemodynamically stable and on heparin infusion. Pharmacy consulted to discuss medication options and available programs with Dr.Groce. Patient prescripton with 30 day prescription card. After DC plan is to have follow up with IM clinic.  - f/u coag workup              antiphospholipid             antithrombin III             Factor 5              homocysteine              JAK2             Protein C and S             prothrombin - on cardiac monitoring - monitor for symptoms of bleeding - start Xarelto    Diet -  regular adult diet DVT ppx  - heparin infusion 1650 units/hr Fluids - none CODE STATUS - FULL CODE  Dispo: Anticipated discharge today  Tamsen Snider, MD PGY1  [phone]

## 2018-12-29 NOTE — TOC Transition Note (Signed)
Transition of Care [facility]) - CM/SW Discharge Note   Patient Details  Name: Keith Potter MRN: [id number] Date of Birth: 01-03-1966  [facility] CM/SW Contact:  Zenon Mayo, RN Phone Number: 12/29/2018, 3:32 PM   Clinical Narrative:    NCM spoke with patient he has the xarelto starter pack in the room for PE.  NCM gave him the patient assistance form to take to MD follow up apt to be filled out to help get assistance for the xarelto refills.  He will be following up with the Internal Medicine Clinic at [facility].     Final next level of care: Home/Self Care Barriers to Discharge: No Barriers Identified   Patient Goals and CMS Choice Patient states their goals for this hospitalization and ongoing recovery are:: get better   Choice offered to / list presented to : NA  Discharge Placement                       Discharge Plan and Services                DME Arranged: (NA)         HH Arranged: NA          Social Determinants of Health (SDOH) Interventions     Readmission Risk Interventions No flowsheet data found.

## 2018-12-29 NOTE — Discharge Summary (Addendum)
Name: Keith Potter MRN: [id number] DOB: 1966-02-06 53 y.o. PCP: Patient, No Pcp Per  Date of Admission: 12/28/2018 11:40 AM Date of Discharge: 12/29/18 Attending Physician: Oda Kilts, MD  Discharge Diagnosis: 1. Unprovoked Pulmonary Embolism  Discharge Medications: Allergies as of 12/29/2018   No Known Allergies      Medication List     STOP taking these medications    penicillin v potassium 500 MG tablet Commonly known as: VEETID   traMADol 50 MG tablet Commonly known as: ULTRAM       TAKE these medications    Xarelto Starter Pack 15 & 20 MG Tbpk Generic drug: Rivaroxaban Take as directed on package.        Disposition and follow-up:   [name] was discharged from [facility] in Stable condition.  At the hospital follow up visit please address:  1.  Unprovoked Pulmonary Embolism - assess for symptoms of shortness of breath and leg pain - follow up coag workup       HLD: Lipid panel on admission below.  - consider starting statin       Prediabetic : A1C 5.7 - counsel patient on diet and lifestyle. Consider starting metformin.   2.  Labs / imaging needed at time of follow-up: none  3.  Pending labs/ test needing follow-up:  f/u coag workup              antiphospholipid             antithrombin III             Factor 5              homocysteine              JAK2             Protein C and S             prothrombin   Follow-up Appointments:    Hospital Course by problem list: 1. Unprovoked Pulmonary Embolism - PMH for bilateral DVTs 5 years ago coming in with R leg pain and swelling with a CTA positive for bilateral pulmonary emboli. Started on heparin infusion. Pharmacy consulted to discuss medication options and available programs with Dr.Groce. Patient started on Xarelto and given 30 day prescription card. DC with plan to follow up with IM clinic.   Discharge Vitals:   BP 126/78 (BP Location: Right Arm)   Pulse 66    Temp 98.3 F (36.8 C) (Oral)   Resp 14   Ht 6' (1.829 m)   Wt 99.2 kg   SpO2 99%   BMI 29.66 kg/m   Pertinent Labs, Studies, and Procedures:  CBC Latest Ref Rng & Units 12/29/2018 12/29/2018 12/28/2018  WBC 4.0 - 10.5 K/uL 11.4(H) - 11.9(H)  Hemoglobin 13.0 - 17.0 g/dL 14.3 - 14.8  Hematocrit 37.5 - 51.0 % 43.1 42.8 44.5  Platelets 150 - 400 K/uL 98(L) - 97(L)   A1C: 5.7  Lipid Panel     Component Value Date/Time   CHOL 214 (H) 12/28/2018 2042   TRIG 75 12/28/2018 2042   HDL 39 (L) 12/28/2018 2042   CHOLHDL 5.5 12/28/2018 2042   VLDL 15 12/28/2018 2042   LDLCALC 160 (H) 12/28/2018 2042     Discharge Instructions: Discharge Instructions     Call MD for:  difficulty breathing, headache or visual disturbances   Complete by: As directed    Call MD for:  persistant dizziness or  light-headedness   Complete by: As directed    Call MD for:  severe uncontrolled pain   Complete by: As directed    Diet - low sodium heart healthy   Complete by: As directed    Discharge instructions   Complete by: As directed    Thank you for allowing us to care for you  Your symptoms were cause by blood clots in your leg and lungs - You were treated with blood thinning medications - You have been provided with the starter dose pack of Xarelto, please take as instructed  We have arranged and appointment for you in our clinic [facility] [phone] - Thursday July 16th @ 2:15 pm   Increase activity slowly   Complete by: As directed        Signed:  Thurmon FairJeff Mayrani Khamis, MD PGY1  [phone]

## 2018-12-30 LAB — FOLATE RBC
Folate, Hemolysate: 452 ng/mL
Folate, RBC: 1056 ng/mL (ref >498)
Hematocrit: 42.8 % (ref 37.5–51.0)

## 2018-12-30 LAB — PROTEIN C, TOTAL: Protein C, Total: 84 % (ref 60–150)

## 2018-12-30 LAB — HOMOCYSTEINE: Homocysteine: 12.6 umol/L (ref 0.0–14.5)

## 2018-12-31 LAB — PROTEIN S ACTIVITY: Protein S Activity: 118 % (ref 63–140)

## 2018-12-31 LAB — PROTEIN C ACTIVITY: Protein C Activity: 87 % (ref 73–180)

## 2018-12-31 NOTE — Telephone Encounter (Signed)
Called ph# listed for pt, answers the [place], they have no guest nor employee by the pt name

## 2019-01-01 LAB — ANTIPHOSPHOLIPID SYNDROME EVAL, BLD
Anticardiolipin IgA: <9 APL U/mL (ref 0–11)
Anticardiolipin IgG: <9 GPL U/mL (ref 0–14)
Anticardiolipin IgM: <9 MPL U/mL (ref 0–12)
DRVVT: 74.8 s — ABNORMAL HIGH (ref 0.0–47.0)
PTT Lupus Anticoagulant: 54.3 s — ABNORMAL HIGH (ref 0.0–51.9)
Phosphatydalserine, IgA: <1 APS IgA (ref 0–20)
Phosphatydalserine, IgG: 2 GPS IgG (ref 0–11)
Phosphatydalserine, IgM: 3 MPS IgM (ref 0–25)

## 2019-01-01 LAB — HEXAGONAL PHASE PHOSPHOLIPID: Hexagonal Phase Phospholipid: 1 s (ref 0–11)

## 2019-01-01 LAB — DRVVT MIX: dRVVT Mix: 49.2 s — ABNORMAL HIGH (ref 0.0–47.0)

## 2019-01-01 LAB — DRVVT CONFIRM: dRVVT Confirm: 1.2 ratio (ref 0.8–1.2)

## 2019-01-01 LAB — PTT-LA INCUB MIX: PTT-LA Incub Mix: 49.3 s — ABNORMAL HIGH (ref 0.0–48.9)

## 2019-01-01 LAB — PTT-LA MIX: PTT-LA Mix: 47.3 s (ref 0.0–48.9)

## 2019-01-04 LAB — PROTHROMBIN GENE MUTATION

## 2019-01-04 LAB — FACTOR 5 LEIDEN

## 2019-01-06 LAB — JAK2  V617F QUAL. WITH REFLEX TO EXON 12: Reflex:: 15

## 2019-01-06 LAB — JAK2 EXONS 12-15

## 2019-01-07 ENCOUNTER — Ambulatory Visit (INDEPENDENT_AMBULATORY_CARE_PROVIDER_SITE_OTHER): Payer: Self-pay | Admitting: Internal Medicine

## 2019-01-07 ENCOUNTER — Other Ambulatory Visit: Payer: Self-pay

## 2019-01-07 VITALS — BP 130/91 | HR 77 | Temp 98.3°F | Ht 72.0 in | Wt 216.6 lb

## 2019-01-07 DIAGNOSIS — Z7901 Long term (current) use of anticoagulants: Secondary | ICD-10-CM

## 2019-01-07 DIAGNOSIS — I2699 Other pulmonary embolism without acute cor pulmonale: Secondary | ICD-10-CM

## 2019-01-07 DIAGNOSIS — Z09 Encounter for follow-up examination after completed treatment for conditions other than malignant neoplasm: Secondary | ICD-10-CM

## 2019-01-07 DIAGNOSIS — F17210 Nicotine dependence, cigarettes, uncomplicated: Secondary | ICD-10-CM

## 2019-01-07 DIAGNOSIS — E785 Hyperlipidemia, unspecified: Secondary | ICD-10-CM

## 2019-01-07 DIAGNOSIS — Z0000 Encounter for general adult medical examination without abnormal findings: Secondary | ICD-10-CM

## 2019-01-07 DIAGNOSIS — E782 Mixed hyperlipidemia: Secondary | ICD-10-CM

## 2019-01-07 DIAGNOSIS — Z89431 Acquired absence of right foot: Secondary | ICD-10-CM

## 2019-01-07 DIAGNOSIS — Z86711 Personal history of pulmonary embolism: Secondary | ICD-10-CM

## 2019-01-07 DIAGNOSIS — Z86718 Personal history of other venous thrombosis and embolism: Secondary | ICD-10-CM

## 2019-01-07 MED ORDER — ATORVASTATIN CALCIUM 20 MG PO TABS: 20.0000 mg | ORAL_TABLET | Freq: Every day | ORAL | 5 refills | Status: DC

## 2019-01-07 MED FILL — ATORVASTATIN 20 MG TABLET: 20 | 30 days supply | Qty: 30 | Fill #0

## 2019-01-07 NOTE — Progress Notes (Signed)
   CC: hospital follow-up  HPI:  [name] is a 54 y.o. with PMH as below presenting for hospital follow-up after 7/06 discharge from the hospital for bilateral unprovoked pulmonary embolism 2/2 and RLE DVT. He was started on xarelto and given a 30 day prescription card prior to discharge. Since that time he has not issues with his medication. He does still have some remaining right lower extremity pain in his proximal thigh where the clot was. He has not noticed any lower extremity swelling and has not had any shortness of breath, chest pain, pleurisy or palpitations.   Please see A&P for assessment of the patient's acute and chronic medical conditions.   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 Review of Systems:   Review of Systems  Constitutional: Negative for chills and fever.  Respiratory: Positive for cough. Negative for sputum production, shortness of breath and wheezing.   Cardiovascular: Negative for chest pain, palpitations and leg swelling.  Gastrointestinal: Negative for abdominal pain, blood in stool, constipation, diarrhea, melena and nausea.  Musculoskeletal: Positive for myalgias. Negative for falls.  Neurological: Negative for dizziness, tingling, sensory change and weakness.    Physical Exam:  Constitution: NAD, healthy appearing Cardio: RRR, no m/r/g, no LE edema Respiratory: CTA, no wheezing rales or rhonchi  Abdominal: NTTP, soft, non-distended, +BS Neuro: a&o, normal affect Skin: c/d/i    Vitals:   01/07/19 1422  BP: (!) 130/91  Pulse: 77  Temp: 98.3 F (36.8 C)  TempSrc: Oral  SpO2: 100%  Weight: 216 lb 9.6 oz (98.2 kg)  Height: 6' (1.829 m)    Assessment & Plan:   See Encounters Tab for problem  based charting.  Patient discussed with Dr. Rebeca Alert

## 2019-01-07 NOTE — Patient Instructions (Signed)
Thank you for allowing [place] to provide your care today. Today we discussed your DVT, pulmonary embolism and high cholesterol   Today we made the following changes to your medications:   Please START taking  Atorvastatin (LIPITOR) 20 mg tablet once per day.   Please schedule an appointment to apply for the [facility].   Please follow-up in one month with your primary care provider.    Should you have any questions or concerns please call the internal medicine clinic at [phone].

## 2019-01-08 ENCOUNTER — Ambulatory Visit: Payer: Self-pay | Admitting: Family Medicine

## 2019-01-10 DIAGNOSIS — E785 Hyperlipidemia, unspecified: Secondary | ICD-10-CM | POA: Insufficient documentation

## 2019-01-10 DIAGNOSIS — Z0000 Encounter for general adult medical examination without abnormal findings: Secondary | ICD-10-CM | POA: Insufficient documentation

## 2019-01-10 NOTE — Assessment & Plan Note (Addendum)
Presenting for hospital follow-up after 7/06 discharge from the hospital for bilateral unprovoked pulmonary embolism 2/2 and RLE DVT. He was started on xarelto and given a 30 day prescription card prior to discharge. Since that time he has not issues with his medication. He does still have some remaining right lower extremity pain in his proximal thigh where the clot was. He has not noticed any lower extremity swelling and has not had any shortness of breath, chest pain, pleurisy or palpitations.   - will need to continue lifelong anticoagulation - message sent to Dr. Mannie Stabile to assist so he can join program to assist with obtaining eliquis - some elevations in anticardiolipin labs. 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 He was not yet on apixaban when these labs were drawn per chart review. 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 Regardless, both confirmatory tests for lupus were negative and these results can be considered negative

## 2019-01-12 NOTE — Progress Notes (Signed)
Internal Medicine Clinic Attending  Case discussed with Dr. Seawell at the time of the visit.  We reviewed the resident's history and exam and pertinent patient test results.  I agree with the assessment, diagnosis, and plan of care documented in the resident's note.  Alexander Raines, M.D., Ph.D.  

## 2019-01-20 ENCOUNTER — Encounter: Payer: Self-pay | Admitting: Pharmacist

## 2019-01-20 NOTE — Progress Notes (Signed)
Medication Samples have been prepared and coordinated for patient. He has an appointment with financial counselor on 8/5 and will pick up samples that day. Notified Development worker, community.  Drug: Xarelto (rivaroxaban) Strength: 20 mg Qty: 4 LOT: [id number] Exp.Date: 06/2020 Dosing instructions: Take 1 tablet by mouth daily with a meal  The patient has been instructed regarding the correct time, dose, and frequency of taking this medication, including desired effects and most common side effects. Patient advised to contact clinic if any questions arise and verbalized understanding by repeat back.  Flossie Dibble 1:31 PM 01/20/2019

## 2019-01-27 ENCOUNTER — Ambulatory Visit: Payer: Self-pay

## 2019-01-27 ENCOUNTER — Telehealth: Payer: Self-pay | Admitting: Pharmacist

## 2019-01-27 NOTE — Progress Notes (Addendum)
Pt is here to pick uo Xarelto samples; pt identified by D.O.B - samples given to pt by Dr Lynnae January.

## 2019-01-31 NOTE — Telephone Encounter (Signed)
Patient was instructed to report to the [facility] for dispensing of samples of rivaroxaban, per Pharmacist, Dr. Mannie Stabile.

## 2019-02-10 ENCOUNTER — Ambulatory Visit: Payer: Self-pay

## 2019-02-10 ENCOUNTER — Other Ambulatory Visit: Payer: Self-pay

## 2019-02-11 ENCOUNTER — Telehealth: Payer: Self-pay

## 2019-02-11 NOTE — Telephone Encounter (Signed)
Called patient to ask him to fill out [employer] patient assistance application & bring tax return. Pt had some confusion about who had given him the application in the first place, I explained that we were the pharmacy team & we were trying to help get his medications for free through the manufacturer. He seemed much more willing to help after I explained that. Stated he understood and would fill out the application and bring it back with the financial documents.

## 2019-03-10 ENCOUNTER — Ambulatory Visit: Payer: Self-pay

## 2019-03-19 ENCOUNTER — Telehealth: Payer: Self-pay

## 2019-03-19 NOTE — Telephone Encounter (Signed)
Left message with family member and call back number.   Brenton Grills, Student-PharmD 03/19/2019 3:39 PM

## 2019-03-20 ENCOUNTER — Emergency Department (HOSPITAL_COMMUNITY): Payer: Self-pay

## 2019-03-20 ENCOUNTER — Encounter (HOSPITAL_COMMUNITY): Payer: Self-pay | Admitting: *Deleted

## 2019-03-20 ENCOUNTER — Emergency Department (HOSPITAL_COMMUNITY)
Admission: EM | Admit: 2019-03-20 | Discharge: 2019-03-20 | Disposition: A | Discharge status: Home / Self Care | Payer: Self-pay | Attending: Emergency Medicine | Admitting: Emergency Medicine

## 2019-03-20 ENCOUNTER — Other Ambulatory Visit: Payer: Self-pay

## 2019-03-20 DIAGNOSIS — Z7901 Long term (current) use of anticoagulants: Secondary | ICD-10-CM | POA: Insufficient documentation

## 2019-03-20 DIAGNOSIS — R1011 Right upper quadrant pain: Secondary | ICD-10-CM | POA: Insufficient documentation

## 2019-03-20 DIAGNOSIS — Z79899 Other long term (current) drug therapy: Secondary | ICD-10-CM | POA: Insufficient documentation

## 2019-03-20 DIAGNOSIS — Z20828 Contact with and (suspected) exposure to other viral communicable diseases: Secondary | ICD-10-CM | POA: Insufficient documentation

## 2019-03-20 DIAGNOSIS — Z86718 Personal history of other venous thrombosis and embolism: Secondary | ICD-10-CM | POA: Insufficient documentation

## 2019-03-20 DIAGNOSIS — R197 Diarrhea, unspecified: Secondary | ICD-10-CM | POA: Insufficient documentation

## 2019-03-20 DIAGNOSIS — F17210 Nicotine dependence, cigarettes, uncomplicated: Secondary | ICD-10-CM | POA: Insufficient documentation

## 2019-03-20 DIAGNOSIS — R112 Nausea with vomiting, unspecified: Secondary | ICD-10-CM | POA: Insufficient documentation

## 2019-03-20 DIAGNOSIS — R05 Cough: Secondary | ICD-10-CM | POA: Insufficient documentation

## 2019-03-20 DIAGNOSIS — Z86711 Personal history of pulmonary embolism: Secondary | ICD-10-CM | POA: Insufficient documentation

## 2019-03-20 HISTORY — DX: Other pulmonary embolism without acute cor pulmonale: I26.99

## 2019-03-20 HISTORY — DX: Acute embolism and thrombosis of unspecified deep veins of unspecified lower extremity: I82.409

## 2019-03-20 LAB — URINALYSIS, ROUTINE W REFLEX MICROSCOPIC
Bacteria, UA: NONE SEEN
Bilirubin Urine: NEGATIVE
Glucose, UA: NEGATIVE mg/dL
Ketones, ur: 5 mg/dL — AB
Nitrite: NEGATIVE
Protein, ur: NEGATIVE mg/dL
Specific Gravity, Urine: 1.015 (ref 1.005–1.030)
pH: 6 (ref 5.0–8.0)

## 2019-03-20 LAB — CBC
HCT: 49.3 % (ref 39.0–52.0)
Hemoglobin: 16.8 g/dL (ref 13.0–17.0)
MCH: 32.1 pg (ref 26.0–34.0)
MCHC: 34.1 g/dL (ref 30.0–36.0)
MCV: 94.3 fL (ref 80.0–100.0)
Platelets: 173 10*3/uL (ref 150–400)
RBC: 5.23 MIL/uL (ref 4.22–5.81)
RDW: 12.5 % (ref 11.5–15.5)
WBC: 9.9 10*3/uL (ref 4.0–10.5)
nRBC: 0 % (ref 0.0–0.2)

## 2019-03-20 LAB — COMPREHENSIVE METABOLIC PANEL
ALT: 26 U/L (ref 0–44)
AST: 25 U/L (ref 15–41)
Albumin: 4 g/dL (ref 3.5–5.0)
Alkaline Phosphatase: 109 U/L (ref 38–126)
Anion gap: 10 (ref 5–15)
BUN: 8 mg/dL (ref 6–20)
CO2: 23 mmol/L (ref 22–32)
Calcium: 9.4 mg/dL (ref 8.9–10.3)
Chloride: 105 mmol/L (ref 98–111)
Creatinine, Ser: 1.14 mg/dL (ref 0.61–1.24)
GFR calc Af Amer: >60 mL/min (ref >60)
GFR calc non Af Amer: >60 mL/min (ref >60)
Glucose, Bld: 116 mg/dL — ABNORMAL HIGH (ref 70–99)
Potassium: 4 mmol/L (ref 3.5–5.1)
Sodium: 138 mmol/L (ref 135–145)
Total Bilirubin: 1 mg/dL (ref 0.3–1.2)
Total Protein: 7.2 g/dL (ref 6.5–8.1)

## 2019-03-20 LAB — LIPASE, BLOOD: Lipase: 30 U/L (ref 11–51)

## 2019-03-20 MED ORDER — SODIUM CHLORIDE 0.9 % IV SOLN: 1000.0000 mL | INTRAVENOUS | Status: DC

## 2019-03-20 MED ORDER — SODIUM CHLORIDE 0.9% FLUSH: 3.0000 mL | Freq: Once | INTRAVENOUS | Status: DC

## 2019-03-20 MED ORDER — ONDANSETRON 8 MG PO TBDP: 8.0000 mg | ORAL_TABLET | Freq: Three times a day (TID) | ORAL | 0 refills | Status: DC | PRN

## 2019-03-20 MED ORDER — ONDANSETRON HCL 4 MG/2ML IJ SOLN
4.0000 mg | Freq: Once | INTRAMUSCULAR | Status: AC
  Administered 2019-03-20: 14:00:00 4 mg via INTRAVENOUS
  Filled 2019-03-20: qty 2

## 2019-03-20 MED ORDER — SODIUM CHLORIDE 0.9 % IV BOLUS (SEPSIS)
1000.0000 mL | Freq: Once | INTRAVENOUS | Status: AC
  Administered 2019-03-20: 1000 mL via INTRAVENOUS

## 2019-03-20 NOTE — Discharge Instructions (Addendum)
Stay at home until you have your covid results back.  Take the medications as needed for nausea.  Follow up with your primary care doctor if not improving in a few days

## 2019-03-20 NOTE — ED Triage Notes (Signed)
C/o abd.  Pain and vomiting after eating yest 4 pm , denies diarrhea

## 2019-03-20 NOTE — ED Provider Notes (Signed)
[facility] EMERGENCY DEPARTMENT Provider Note   CSN: [id number] Arrival date & time: 03/20/19  8270     History   Chief Complaint Chief Complaint  Patient presents with  . Emesis  . Abdominal Pain    HPI Keith Potter is a 53 y.o. male.     HPI Pt was at a restaurant yesterday.  About 3 hours later he started vomiting.  He vomited about 5-6 times throughout the evening and night.   He is having some loose stools but not a large amount of diarrhea.  He feels weak and fatigued. He is having pain in his abdomen, mostly on the right side.  He has been coughing as well.  No burning with urinating. Past Medical History:  Diagnosis Date  . DVT (deep venous thrombosis) (HCC)   . Pulmonary emboli [facility])     Patient Active Problem List   Diagnosis Date Noted  . Hyperlipidemia 01/10/2019  . Healthcare maintenance 01/10/2019  . Pulmonary embolism (HCC) 12/28/2018  . History of DVT (deep vein thrombosis) 12/28/2018    Past Surgical History:  Procedure Laterality Date  . AMPUTATION TOE    . APPENDECTOMY    . HERNIA REPAIR          Home Medications    Prior to Admission medications   Medication Sig Start Date End Date Taking? Authorizing Provider  atorvastatin (LIPITOR) 20 MG tablet Take 1 tablet (20 mg total) by mouth daily. 01/07/19   Seawell, Jaimie A, DO  ibuprofen (ADVIL) 200 MG tablet Take 600 mg by mouth every 6 (six) hours as needed for moderate pain.    [provider]  ondansetron (ZOFRAN ODT) 8 MG disintegrating tablet Take 1 tablet (8 mg total) by mouth every 8 (eight) hours as needed for nausea or vomiting. 03/20/19   Linwood Dibbles, MD  Rivaroxaban (XARELTO STARTER PACK) 15 & 20 MG TBPK Take as directed on package. 12/29/18   Anne Shutter, MD  tetrahydrozoline 0.05 % ophthalmic solution Place 1 drop into both eyes as needed (dry eyes).    [provider]    Family History No family history on file.  Social History Social  History   Tobacco Use  . Smoking status: Current Every Day Smoker    Packs/day: 0.50    Types: Cigarettes  . Smokeless tobacco: Never Used  Substance Use Topics  . Alcohol use: Yes  . Drug use: Yes    Types: Marijuana, Cocaine  Works as a Paediatric nurse   Allergies   Patient has no known allergies.   Review of Systems Review of Systems  Respiratory: Positive for cough.   All other systems reviewed and are negative.    Physical Exam Updated Vital Signs BP 116/79   Pulse 69   Temp 98.7 F (37.1 C) (Oral)   Resp 16   Ht 1.854 m (6\' 1" )   Wt 97.5 kg   SpO2 98%   BMI 28.37 kg/m   Physical Exam Vitals signs and nursing note reviewed.  Constitutional:      General: He is not in acute distress.    Appearance: He is well-developed.  HENT:     Head: Normocephalic and atraumatic.     Right Ear: External ear normal.     Left Ear: External ear normal.  Eyes:     General: No scleral icterus.       Right eye: No discharge.        Left eye: No discharge.  Conjunctiva/sclera: Conjunctivae normal.  Neck:     Musculoskeletal: Neck supple.     Trachea: No tracheal deviation.  Cardiovascular:     Rate and Rhythm: Normal rate and regular rhythm.  Pulmonary:     Effort: Pulmonary effort is normal. No respiratory distress.     Breath sounds: Normal breath sounds. No stridor. No wheezing or rales.  Abdominal:     General: Bowel sounds are normal. There is no distension.     Palpations: Abdomen is soft.     Tenderness: There is abdominal tenderness in the right upper quadrant. There is no guarding or rebound.  Musculoskeletal:        General: No tenderness.  Skin:    General: Skin is warm and dry.     Findings: No rash.  Neurological:     Mental Status: He is alert.     Cranial Nerves: No cranial nerve deficit (no facial droop, extraocular movements intact, no slurred speech).     Sensory: No sensory deficit.     Motor: No abnormal muscle tone or seizure activity.      Coordination: Coordination normal.      ED Treatments / Results  Labs (all labs ordered are listed, but only abnormal results are displayed) Labs Reviewed  COMPREHENSIVE METABOLIC PANEL - Abnormal; Notable for the following components:      Result Value   Glucose, Bld 116 (*)    All other components within normal limits  URINALYSIS, ROUTINE W REFLEX MICROSCOPIC - Abnormal; Notable for the following components:   Hgb urine dipstick SMALL (*)    Ketones, ur 5 (*)    Leukocytes,Ua TRACE (*)    All other components within normal limits  NOVEL CORONAVIRUS, NAA (HOSP ORDER, SEND-OUT TO REF LAB; TAT 18-24 HRS)  LIPASE, BLOOD  CBC    EKG None  Radiology US Abdomen Complete  Result Date: 03/20/2019 CLINICAL DATA:  RIGHT upper quadrant pain for 1 day.  Abdominal pain EXAM: ABDOMEN ULTRASOUND COMPLETE COMPARISON:  None. FINDINGS: Gallbladder: Several small echogenic foci within the gallbladder lumen. Several wall adherent echogenic foci. Findings likely represent a combination of gallstones and gallbladder polyps. No pericholecystic fluid or gallbladder wall thickening. Positive sonographic Murphy's sign. No gallbladder distension. Common bile duct: Diameter: Normal 4 mm Liver: No focal lesion identified. Within normal limits in parenchymal echogenicity. Portal vein is patent on color Doppler imaging with normal direction of blood flow towards the liver. IVC: No abnormality visualized. Pancreas: Not visualized Spleen: Size and appearance within normal limits. Right Kidney: Length: 10.0 cm.  There is Left Kidney: Length: 10.6 cm. Echogenicity within normal limits. No mass or hydronephrosis visualized. Abdominal aorta: No aneurysm visualized. Other findings: None. IMPRESSION: 1. Nondistended gallbladder with multiple small polyps and/or stones. Positive sonographic Murphy's sign. Imaging findings not typical of acute cholecystitis. 2. No biliary duct dilatation. Electronically Signed   By: Suzy Bouchard M.D.   On: 03/20/2019 13:16   Dg Abdomen Acute W/chest  Result Date: 03/20/2019 CLINICAL DATA:  Abdominal pain.  Vomiting. EXAM: DG ABDOMEN ACUTE W/ 1V CHEST COMPARISON:  CT of the chest December 28, 2018 FINDINGS: The heart, hila, and mediastinum are normal. No pneumothorax. No pulmonary nodules, masses, or focal infiltrates. No free air, portal venous gas, or pneumatosis. The bowel gas pattern is nonobstructive with nondilated loops of large and small bowel identified. No renal or ureteral stones are identified. No other acute abnormalities identified. IMPRESSION: No cause for the patient's symptoms identified. Electronically Signed  By: Gerome Samavid  Williams III M.D   On: 03/20/2019 15:01    Procedures Procedures (including critical care time)  Medications Ordered in ED Medications  sodium chloride flush (NS) 0.9 % injection 3 mL (3 mLs Intravenous Not Given 03/20/19 1407)  sodium chloride 0.9 % bolus 1,000 mL (1,000 mLs Intravenous New Bag/Given 03/20/19 1408)    Followed by  0.9 %  sodium chloride infusion (has no administration in time range)  ondansetron (ZOFRAN) injection 4 mg (4 mg Intravenous Given 03/20/19 1418)     Initial Impression / Assessment and Plan / ED Course  I have reviewed the triage vital signs and the nursing notes.  Pertinent labs & imaging results that were available during my care of the patient were reviewed by me and considered in my medical decision making (see chart for details).  Clinical Course as of Mar 19 1521  Sat Mar 20, 2019  1431 Ultrasound findings reviewed.  Positive sonographic Eulah PontMurphy sign however findings are not overall suggestive of acute cholecystitis   [JK]  1515 Plain films do not show any evidence of obstruction   [JK]    Clinical Course User Index [JK] Linwood DibblesKnapp, Jahkeem Kurka, MD     Patient presented to the ED for evaluation of abdominal pain and vomiting.  There is reassuring.  I doubt obstruction.  Ultrasound findings noted.  I doubt acute  cholecystitis.  No evidence of pancreatitis or hepatitis.  Symptoms may be related to a viral illness.  Mild food poisoning is also a possibility.  Symptoms are not typical for COVID-19 but the patient has noted a cough recently and requested testing.  At this time there does not appear to be any evidence of an acute emergency medical condition and the patient appears stable for discharge with appropriate outpatient follow up.   Final Clinical Impressions(s) / ED Diagnoses   Final diagnoses:  Nausea vomiting and diarrhea    ED Discharge Orders         Ordered    ondansetron (ZOFRAN ODT) 8 MG disintegrating tablet  Every 8 hours PRN     03/20/19 1522           Linwood DibblesKnapp, Antjuan Rothe, MD 03/20/19 1524

## 2019-03-21 LAB — NOVEL CORONAVIRUS, NAA (HOSP ORDER, SEND-OUT TO REF LAB; TAT 18-24 HRS): SARS-CoV-2, NAA: NOT DETECTED

## 2019-03-23 ENCOUNTER — Telehealth (HOSPITAL_COMMUNITY): Payer: Self-pay

## 2019-03-25 NOTE — Telephone Encounter (Signed)
Tried following up with patient to continue to help with rivaroxaban (Xarelto) access, but unable to reach patient and he no-showed to appointment 9/16. Signing off of case but feel free to re-consult in the future.

## 2019-03-26 ENCOUNTER — Emergency Department (HOSPITAL_COMMUNITY): Payer: Self-pay

## 2019-03-26 ENCOUNTER — Other Ambulatory Visit: Payer: Self-pay

## 2019-03-26 ENCOUNTER — Observation Stay (HOSPITAL_COMMUNITY)
Admission: EM | Admit: 2019-03-26 | Discharge: 2019-03-27 | Disposition: A | Discharge status: Home / Self Care | Payer: Self-pay | Attending: Internal Medicine | Admitting: Internal Medicine

## 2019-03-26 ENCOUNTER — Encounter (HOSPITAL_COMMUNITY): Payer: Self-pay | Admitting: *Deleted

## 2019-03-26 DIAGNOSIS — Z20828 Contact with and (suspected) exposure to other viral communicable diseases: Secondary | ICD-10-CM | POA: Insufficient documentation

## 2019-03-26 DIAGNOSIS — E785 Hyperlipidemia, unspecified: Secondary | ICD-10-CM | POA: Insufficient documentation

## 2019-03-26 DIAGNOSIS — Z89429 Acquired absence of other toe(s), unspecified side: Secondary | ICD-10-CM | POA: Insufficient documentation

## 2019-03-26 DIAGNOSIS — Z86711 Personal history of pulmonary embolism: Secondary | ICD-10-CM | POA: Insufficient documentation

## 2019-03-26 DIAGNOSIS — I2699 Other pulmonary embolism without acute cor pulmonale: Secondary | ICD-10-CM | POA: Diagnosis present

## 2019-03-26 DIAGNOSIS — D72829 Elevated white blood cell count, unspecified: Secondary | ICD-10-CM | POA: Insufficient documentation

## 2019-03-26 DIAGNOSIS — N179 Acute kidney failure, unspecified: Secondary | ICD-10-CM | POA: Insufficient documentation

## 2019-03-26 DIAGNOSIS — Z7901 Long term (current) use of anticoagulants: Secondary | ICD-10-CM | POA: Insufficient documentation

## 2019-03-26 DIAGNOSIS — I2693 Single subsegmental pulmonary embolism without acute cor pulmonale: Principal | ICD-10-CM | POA: Insufficient documentation

## 2019-03-26 DIAGNOSIS — I4891 Unspecified atrial fibrillation: Secondary | ICD-10-CM | POA: Insufficient documentation

## 2019-03-26 DIAGNOSIS — F17210 Nicotine dependence, cigarettes, uncomplicated: Secondary | ICD-10-CM | POA: Insufficient documentation

## 2019-03-26 DIAGNOSIS — Z79899 Other long term (current) drug therapy: Secondary | ICD-10-CM | POA: Insufficient documentation

## 2019-03-26 DIAGNOSIS — Z86718 Personal history of other venous thrombosis and embolism: Secondary | ICD-10-CM | POA: Insufficient documentation

## 2019-03-26 DIAGNOSIS — I471 Supraventricular tachycardia: Secondary | ICD-10-CM | POA: Insufficient documentation

## 2019-03-26 LAB — BASIC METABOLIC PANEL
Anion gap: 14 (ref 5–15)
BUN: 9 mg/dL (ref 6–20)
CO2: 22 mmol/L (ref 22–32)
Calcium: 9.8 mg/dL (ref 8.9–10.3)
Chloride: 103 mmol/L (ref 98–111)
Creatinine, Ser: 1.81 mg/dL — ABNORMAL HIGH (ref 0.61–1.24)
GFR calc Af Amer: 48 mL/min — ABNORMAL LOW (ref >60)
GFR calc non Af Amer: 41 mL/min — ABNORMAL LOW (ref >60)
Glucose, Bld: 186 mg/dL — ABNORMAL HIGH (ref 70–99)
Potassium: 4.2 mmol/L (ref 3.5–5.1)
Sodium: 139 mmol/L (ref 135–145)

## 2019-03-26 LAB — CBC
HCT: 52 % (ref 39.0–52.0)
Hemoglobin: 17 g/dL (ref 13.0–17.0)
MCH: 30.6 pg (ref 26.0–34.0)
MCHC: 32.7 g/dL (ref 30.0–36.0)
MCV: 93.7 fL (ref 80.0–100.0)
Platelets: 198 10*3/uL (ref 150–400)
RBC: 5.55 MIL/uL (ref 4.22–5.81)
RDW: 12.5 % (ref 11.5–15.5)
WBC: 14.1 10*3/uL — ABNORMAL HIGH (ref 4.0–10.5)
nRBC: 0 % (ref 0.0–0.2)

## 2019-03-26 LAB — TROPONIN I (HIGH SENSITIVITY): Troponin I (High Sensitivity): 20 ng/L — ABNORMAL HIGH (ref <18)

## 2019-03-26 LAB — SARS CORONAVIRUS 2 BY RT PCR (HOSPITAL ORDER, PERFORMED IN ~~LOC~~ HOSPITAL LAB): SARS Coronavirus 2: NEGATIVE

## 2019-03-26 MED ORDER — IOHEXOL 350 MG/ML SOLN
100.0000 mL | Freq: Once | INTRAVENOUS | Status: AC | PRN
  Administered 2019-03-26: 100 mL via INTRAVENOUS

## 2019-03-26 MED ORDER — RIVAROXABAN 15 MG PO TABS
15.0000 mg | ORAL_TABLET | Freq: Once | ORAL | Status: AC
  Administered 2019-03-27: 15 mg via ORAL
  Filled 2019-03-26: qty 1

## 2019-03-26 MED ORDER — SODIUM CHLORIDE 0.9 % IV SOLN
INTRAVENOUS | Status: DC
  Administered 2019-03-27: via INTRAVENOUS

## 2019-03-26 MED ORDER — SODIUM CHLORIDE 0.9% FLUSH: 3.0000 mL | Freq: Once | INTRAVENOUS | Status: DC

## 2019-03-26 NOTE — ED Triage Notes (Signed)
The pt is c/o sob for 2-3 days  He also has pain in his lt calf  tabhycardia  He stopped taking blood thinners 2 weeks ago  Hx of dvts

## 2019-03-26 NOTE — ED Provider Notes (Signed)
[facility]ERGENCY [facility]PART[facility] Provider Note   CSN: [id number] Arrival date & time: 03/26/19  2101     History   Chief Complaint Chief Complaint  Patient presents with  . Shortness of Breath    HPI Keith Potter is a 53 y.o. male.     Patient is a 53 year old male with a history of PE DVT who was on Xarelto but ran out approximately 1 month ago and has been off since that time presenting today with right-sided calf pain that started while he was at work today that developed into shortness of breath and near syncope.  Patient states that shortness of breath is better with rest and worse with exertion but he still feels slightly at rest.  Patient feels dizzy like he might pass out when he gets up and attempts to walk.  Patient states that he is no longer taking the Xarelto because he did not have a regular doctor but based on notes he did not show up to the doctor's appointment and did not have the medication filled.  He also for the last 2 weeks has had cough and mild congestion.  He was seen in the emergency room last week and at that time was tested for COVID which was negative.  He has not had fever but has had some mild clear phlegm that is been nonbloody.  Patient is a smoker but denies any drug or alcohol use.  The history is provided by the patient.  Shortness of Breath Severity:  Moderate Onset quality:  Gradual Duration:  1 day Timing:  Constant Progression:  Waxing and waning Chronicity:  Recurrent Context: activity   Relieved by:  Rest Worsened by:  Activity Ineffective treatments:  None tried Associated symptoms: cough and sputum production   Associated symptoms: no chest pain, no fever, no vomiting and no wheezing   Associated symptoms comment:  Near syncope, right calf pain Risk factors: hx of PE/DVT     Past Medical History:  Diagnosis Date  . DVT (deep venous thrombosis) (HCC)   . Pulmonary emboli [facility](HCC)     Patient Active Problem List    Diagnosis Date Noted  . Hyperlipidemia 01/10/2019  . Healthcare maintenance 01/10/2019  . Pulmonary embolism (HCC) 12/28/2018  . History of DVT (deep vein thrombosis) 12/28/2018    Past Surgical History:  Procedure Laterality Date  . AMPUTATION TOE    . APPENDECTOMY    . HERNIA REPAIR          Home Medications    Prior to Admission medications   Medication Sig Start Date End Date Taking? Authorizing Provider  atorvastatin (LIPITOR) 20 MG tablet Take 1 tablet (20 mg total) by mouth daily. 01/07/19   Seawell, Jaimie A, DO  ibuprofen (ADVIL) 200 MG tablet Take 600 mg by mouth every 6 (six) hours as needed for moderate pain.    [provider]  ondansetron (ZOFRAN ODT) 8 MG disintegrating tablet Take 1 tablet (8 mg total) by mouth every 8 (eight) hours as needed for nausea or vomiting. 03/20/19   Linwood DibblesKnapp, Jon, MD  Rivaroxaban (XARELTO STARTER PACK) 15 & 20 MG TBPK Take as directed on package. 12/29/18   Anne Shutteraines, Alexander N, MD  tetrahydrozoline 0.05 % ophthalmic solution Place 1 drop into both eyes as needed (dry eyes).    [provider]    Family History No family history on file.  Social History Social History   Tobacco Use  . Smoking status: Current Every Day  Smoker    Packs/day: 0.50    Types: Cigarettes  . Smokeless tobacco: Never Used  Substance Use Topics  . Alcohol use: Yes  . Drug use: Yes    Types: Marijuana, Cocaine     Allergies   Patient has no known allergies.   Review of Systems Review of Systems  Constitutional: Negative for fever.  Respiratory: Positive for cough, sputum production and shortness of breath. Negative for wheezing.   Cardiovascular: Negative for chest pain.  Gastrointestinal: Negative for vomiting.  All other systems reviewed and are negative.    Physical Exam Updated Vital Signs BP 92/69   Pulse (!) 101   Temp 98.6 F (37 C) (Oral)   Resp 18   Ht 6' (1.829 m)   Wt 95.3 kg   SpO2 95%   BMI 28.48 kg/m    Physical Exam Vitals signs and nursing note reviewed.  Constitutional:      General: He is not in acute distress.    Appearance: He is well-developed.  HENT:     Head: Normocephalic and atraumatic.  Eyes:     Conjunctiva/sclera: Conjunctivae normal.     Pupils: Pupils are equal, round, and reactive to light.  Neck:     Musculoskeletal: Normal range of motion and neck supple.  Cardiovascular:     Rate and Rhythm: Regular rhythm. Tachycardia present.     Pulses: Normal pulses.     Heart sounds: No murmur.  Pulmonary:     Effort: Pulmonary effort is normal. No tachypnea, accessory muscle usage or respiratory distress.     Breath sounds: Normal breath sounds. No decreased breath sounds, wheezing or rales.  Chest:     Chest wall: No tenderness.  Abdominal:     General: There is no distension.     Palpations: Abdomen is soft.     Tenderness: There is no abdominal tenderness. There is no guarding or rebound.  Musculoskeletal: Normal range of motion.        General: Swelling present.     Right lower leg: He exhibits tenderness.     Comments: Right calf tenderness with positive Homans sign and mild swelling in the right lower extremity  Skin:    General: Skin is warm and dry.     Capillary Refill: Capillary refill takes less than 2 seconds.     Findings: No erythema or rash.  Neurological:     General: No focal deficit present.     Mental Status: He is alert and oriented to person, place, and time. Mental status is at baseline.  Psychiatric:        Mood and Affect: Mood normal.        Behavior: Behavior normal.        Thought Content: Thought content normal.      ED Treatments / Results  Labs (all labs ordered are listed, but only abnormal results are displayed) Labs Reviewed  BASIC METABOLIC PANEL - Abnormal; Notable for the following components:      Result Value   Glucose, Bld 186 (*)    Creatinine, Ser 1.81 (*)    GFR calc non Af Amer 41 (*)    GFR calc Af Amer 48  (*)    All other components within normal limits  CBC - Abnormal; Notable for the following components:   WBC 14.1 (*)    All other components within normal limits  TROPONIN I (HIGH SENSITIVITY) - Abnormal; Notable for the following components:   Troponin I (High  Sensitivity) 20 (*)    All other components within normal limits  SARS CORONAVIRUS 2 (HOSPITAL ORDER, [facility])  TROPONIN I (HIGH SENSITIVITY)    EKG EKG Interpretation  Date/Time:  Friday March 26 2019 21:50:08 EDT Ventricular Rate:  99 PR Interval:    QRS Duration: 91 QT Interval:  349 QTC Calculation: 448 R Axis:   71 Text Interpretation:  Sinus rhythm Consider left atrial enlargement Supraventricular tachycardia RESOLVED SINCE PREVIOUS Confirmed by Blanchie Dessert [phone]) on 03/26/2019 10:08:25 PM   Radiology No results found.  Procedures Procedures (including critical care time)  Medications Ordered in ED Medications  sodium chloride flush (NS) 0.9 % injection 3 mL (3 mLs Intravenous Not Given 03/26/19 2206)     Initial Impression / Assessment and Plan / ED Course  I have reviewed the triage vital signs and the nursing notes.  Pertinent labs & imaging results that were available during my care of the patient were reviewed by me and considered in my medical decision making (see chart for details).        Patient is a 53 year old male presenting today with right calf pain, shortness of breath and near syncope.  Upon arrival patient's heart rate was 156 and was either and A. fib RVR versus SVT pattern that resolved upon entering the trauma bay.  Patient is complaining of shortness of breath but denies any chest pain.  He is a smoker but has no known heart history.  He has no diagnosis of COPD or asthma.  Patient is not wheezing on exam but does have a significant history of PE and DVT.  He has been off Xarelto for approximately 1 month.  Concern for massive PE and DVT as the  cause for his symptoms today.  Repeat EKG after improvement in heart rate shows a sinus rhythm.  Patient has also had URI type symptoms for the last 2 weeks with a cough with minimal sputum production.  He tested negative last week for COVID but also possibility for COVID or pneumonia.  We will do a CTA of the chest to rule out PE given his high risk.  At this time patient is comfortable with blood pressure in the high 90s, oxygen saturation in the mid 90s and pulse between 95 and 100.  He is in no acute distress at this time.  11:46 PM Labs with leukocytosis with 14,000 and Cr with new AKI with creatinine of 1.81.  CT with new small PE.  However given new leg pain, off xarelto, initial tachy and intermitent hypotension will admit for further care and give IV fluid bolus.  Final Clinical Impressions(s) / ED Diagnoses   Final diagnoses:  Single subsegmental pulmonary embolism without acute cor pulmonale (HCC)  AKI (acute kidney injury) [facility])    ED Discharge Orders    None       Blanchie Dessert, MD 03/26/19 2349

## 2019-03-26 NOTE — ED Notes (Signed)
Pt returned from CT °

## 2019-03-27 DIAGNOSIS — I2699 Other pulmonary embolism without acute cor pulmonale: Secondary | ICD-10-CM | POA: Diagnosis present

## 2019-03-27 LAB — CBC
HCT: 44.5 % (ref 39.0–52.0)
Hemoglobin: 14.8 g/dL (ref 13.0–17.0)
MCH: 31.2 pg (ref 26.0–34.0)
MCHC: 33.3 g/dL (ref 30.0–36.0)
MCV: 93.9 fL (ref 80.0–100.0)
Platelets: 147 10*3/uL — ABNORMAL LOW (ref 150–400)
RBC: 4.74 MIL/uL (ref 4.22–5.81)
RDW: 12.5 % (ref 11.5–15.5)
WBC: 10 10*3/uL (ref 4.0–10.5)
nRBC: 0 % (ref 0.0–0.2)

## 2019-03-27 LAB — BASIC METABOLIC PANEL
Anion gap: 7 (ref 5–15)
BUN: 15 mg/dL (ref 6–20)
CO2: 21 mmol/L — ABNORMAL LOW (ref 22–32)
Calcium: 8.4 mg/dL — ABNORMAL LOW (ref 8.9–10.3)
Chloride: 110 mmol/L (ref 98–111)
Creatinine, Ser: 1.41 mg/dL — ABNORMAL HIGH (ref 0.61–1.24)
GFR calc Af Amer: >60 mL/min (ref >60)
GFR calc non Af Amer: 56 mL/min — ABNORMAL LOW (ref >60)
Glucose, Bld: 123 mg/dL — ABNORMAL HIGH (ref 70–99)
Potassium: 3.6 mmol/L (ref 3.5–5.1)
Sodium: 138 mmol/L (ref 135–145)

## 2019-03-27 LAB — PROTIME-INR
INR: 2.2 — ABNORMAL HIGH (ref 0.8–1.2)
Prothrombin Time: 23.8 seconds — ABNORMAL HIGH (ref 11.4–15.2)

## 2019-03-27 LAB — APTT: aPTT: 42 seconds — ABNORMAL HIGH (ref 24–36)

## 2019-03-27 LAB — TROPONIN I (HIGH SENSITIVITY): Troponin I (High Sensitivity): 18 ng/L — ABNORMAL HIGH (ref <18)

## 2019-03-27 MED ORDER — RIVAROXABAN 15 MG PO TABS
15.0000 mg | ORAL_TABLET | Freq: Two times a day (BID) | ORAL | Status: DC
  Administered 2019-03-27: 15 mg via ORAL
  Filled 2019-03-27: qty 1

## 2019-03-27 MED ORDER — ATORVASTATIN CALCIUM 10 MG PO TABS
20.0000 mg | ORAL_TABLET | Freq: Every day | ORAL | Status: DC
  Administered 2019-03-27: 20 mg via ORAL
  Filled 2019-03-27: qty 2

## 2019-03-27 MED ORDER — RIVAROXABAN 15 MG PO TABS: ORAL_TABLET | ORAL | 0 refills | Status: DC

## 2019-03-27 MED ORDER — ACETAMINOPHEN 650 MG RE SUPP: 650.0000 mg | Freq: Four times a day (QID) | RECTAL | Status: DC | PRN

## 2019-03-27 MED ORDER — ACETAMINOPHEN 325 MG PO TABS: 650.0000 mg | ORAL_TABLET | Freq: Four times a day (QID) | ORAL | Status: DC | PRN

## 2019-03-27 NOTE — Discharge Instructions (Signed)
It was a pleasure meeting you at this hospitalization. I would strongly strongly encourage you to complete the course of Xarelto. Our Internal Medicine Team will do our best to work with you on any barriers you may find. Pulmonary embolisms can be life threatening as we discussed.  Additionally, I'd like you to be seen in our clinic this week. I sent a message to our scheduler to call you with an appointment time and date (preferably late morning).   Information on my medicine - XARELTO (rivaroxaban)  This medication education was reviewed with me or my healthcare representative as part of my discharge preparation.  WHY WAS XARELTO PRESCRIBED FOR YOU? Xarelto was prescribed to treat blood clots that may have been found in the veins of your legs (deep vein thrombosis) or in your lungs (pulmonary embolism) and to reduce the risk of them occurring again.  What do you need to know about Xarelto? The starting dose is one 15 mg tablet taken TWICE daily with food for the FIRST 21 DAYS then on (enter date)  10/24  the dose is changed to one 20 mg tablet taken ONCE A DAY with your evening meal.  DO NOT stop taking Xarelto without talking to the health care provider who prescribed the medication.  Refill your prescription for 20 mg tablets before you run out.  After discharge, you should have regular check-up appointments with your healthcare provider that is prescribing your Xarelto.  In the future your dose may need to be changed if your kidney function changes by a significant amount.  What do you do if you miss a dose? If you are taking Xarelto TWICE DAILY and you miss a dose, take it as soon as you remember. You may take two 15 mg tablets (total 30 mg) at the same time then resume your regularly scheduled 15 mg twice daily the next day.  If you are taking Xarelto ONCE DAILY and you miss a dose, take it as soon as you remember on the same day then continue your regularly scheduled once daily  regimen the next day. Do not take two doses of Xarelto at the same time.   Important Safety Information Xarelto is a blood thinner medicine that can cause bleeding. You should call your healthcare provider right away if you experience any of the following: ? Bleeding from an injury or your nose that does not stop. ? Unusual colored urine (red or dark brown) or unusual colored stools (red or black). ? Unusual bruising for unknown reasons. ? A serious fall or if you hit your head (even if there is no bleeding).  Some medicines may interact with Xarelto and might increase your risk of bleeding while on Xarelto. To help avoid this, consult your healthcare provider or pharmacist prior to using any new prescription or non-prescription medications, including herbals, vitamins, non-steroidal anti-inflammatory drugs (NSAIDs) and supplements.  This website has more information on Xarelto: [email or web address].[email or web address]

## 2019-03-27 NOTE — ED Provider Notes (Signed)
I called report To the internal medicine resident.  Patient is awake alert this time.  Heart rate is appropriate.  Blood pressure systolic 381.  Patient appropriate for admission   Ripley Fraise, MD 03/27/19 [phone]

## 2019-03-27 NOTE — H&P (Signed)
Date: 03/27/2019               Patient Name:  Keith Potter MRN: [id number]  DOB: Jul 19, 1965 Age / Sex: 53 y.o., male   PCP: Patient, No Pcp Per         Medical Service: Internal Medicine Teaching Service         Attending Physician: Dr. Lynnae January, Real Cons, MD    First Contact: Darrick Meigs, MD, Rylee Pager: [place] [phone])  Second Contact: Sharon Seller DO, Jaimie PagerJari Pigg [phone])       After Hours (After 5p/  First Contact Pager: [phone]  weekends / holidays): Second Contact Pager: [phone]   Chief Complaint: shortness of breath   History of Present Illness: 53 y.o. yo male w/ PMH significant for PE/DVT and HLD presenting with shortness of breath and syncope while at work. Patient on long term anticoagulation with Xarelto but has not taken it for the past month. He had right calf pain earlier today while at work and subsequently developed dyspnea and lightheadedness. Dyspnea is worse with exertion and improved with rest. He also endorses dizziness and feelings of syncope on standing and walking. Patient has also had a cough productive of clear phlegm and mild congestion for past two weeks. He denies any fever, chills, chest pain, hemoptysis, nausea/vomiting, or abdominal pain.   Patient was tachycardic with Afib with RVR vs SVT pattern on arrival that resolved in the ED. Due to concerns for PE, CTA chest ordered given his high risk. CT showed new small PE and labs consistent with leukocytosis and elevated creatinine concerning for AKI. Patient admitted for further management.    Meds:  No outpatient medications have been marked as taking for the 03/26/19 encounter [facility] Encounter).    Allergies: Allergies as of 03/26/2019  . (No Known Allergies)   Past Medical History:  Diagnosis Date  . DVT (deep venous thrombosis) ([place])   . Pulmonary emboli (HCC)     Family History: No family history on file.   Social History:  Social History   Tobacco Use  . Smoking  status: Current Every Day Smoker    Packs/day: 0.50    Types: Cigarettes  . Smokeless tobacco: Never Used  Substance Use Topics  . Alcohol use: Yes  . Drug use: Yes    Types: Marijuana, Cocaine     Review of Systems: Review of Systems  Constitutional: Negative for chills, diaphoresis, fever and malaise/fatigue.  HENT: Positive for congestion. Negative for sinus pain and sore throat.   Eyes: Negative for blurred vision, double vision and photophobia.  Respiratory: Positive for cough, sputum production and shortness of breath. Negative for hemoptysis and wheezing.   Cardiovascular: Positive for palpitations. Negative for chest pain, claudication and leg swelling.  Gastrointestinal: Negative for abdominal pain, constipation, diarrhea, nausea and vomiting.  Genitourinary: Negative for dysuria, frequency and urgency.  Musculoskeletal: Negative for back pain, falls and myalgias.  Neurological: Positive for dizziness. Negative for tingling, sensory change, speech change, focal weakness and headaches.   Physical Exam: Blood pressure 99/77, pulse 85, temperature 98.6 F (37 C), temperature source Oral, resp. rate 19, height 6' (1.829 m), weight 95.3 kg, SpO2 97 %. Physical Exam Vitals signs reviewed.  Constitutional:      General: He is not in acute distress.    Appearance: He is well-developed. He is not diaphoretic.  HENT:     Head: Normocephalic and atraumatic.     Mouth/Throat:     Mouth: Mucous membranes  are moist.     Pharynx: Oropharynx is clear.  Cardiovascular:     Rate and Rhythm: Normal rate and regular rhythm.     Pulses: Normal pulses. No decreased pulses.     Heart sounds: Normal heart sounds. No murmur. No friction rub. No gallop.   Pulmonary:     Effort: Pulmonary effort is normal. No accessory muscle usage or respiratory distress.     Breath sounds: Normal breath sounds.  Chest:     Chest wall: No mass or tenderness.  Abdominal:     General: Bowel sounds are  normal.     Palpations: Abdomen is soft.     Tenderness: There is no abdominal tenderness. There is no guarding.  Musculoskeletal: Normal range of motion.     Right lower leg: He exhibits tenderness. No edema.  Skin:    General: Skin is warm and dry.     Capillary Refill: Capillary refill takes less than 2 seconds.  Neurological:     General: No focal deficit present.     Mental Status: He is alert and oriented to person, place, and time.  Psychiatric:        Mood and Affect: Mood normal.        Behavior: Behavior normal.     EKG: personally reviewed my interpretation is normal sinus rhythm.   CT ANGIO: Small, nonocclusive pulmonary embolism within the right main pulmonary artery.    Assessment & Plan by Problem:  Patient is a 53yo male with history of PE/DVT on Xarelto but has not taken it in one month presenting with dyspnea and near syncope found to have PE on CTA.   PE/DVT:  Patient presented with one day history of right leg pain, dyspnea on exertion and dizziness on standing. He has a history of PE/DVT and was supposed to be on long term anticoagulation with Xarelto. However, patient has not been taking his medication over the past month. He reports he ran out and did not follow up in clinic. CTA Chest consistent with non-occlusive pulmonary embolism in the right main pulmonary artery. On examination, patient appears to be comfortable and not in any acute distress. He is not tachycardic or tachypneic and is saturating well on room air. Cardiopulmonary exam is wnl. Patient has right calf tenderness to palpation and right calf seems to be warmer than left but no significant change in circumference noted between right and left calf. Positive Homan sign.  - Continue Xarelto at 15mg  bid  - PTT/INR/CBC - Monitor vitals   Acute Kidney Injury:  BUN/CR 9/1.81 with GFR 48 today. Cr 1.14 with GFR>60 on 9/26. Unclear etiology as patient does not have any history of BPH or symptoms of  urinary retention so does not appear to be post-renal. Pre-renal cause is a possibility as patient was evaluated recently for nausea/vomiting and loose stools that has since resolved. Patient received IV fluids in the ED.  - Continue IV fluids - BMP in AM   Leukocytosis:  Patient with WBC 14.1. Unclear source of elevation given resolution of patient's GI symptoms. Although patient does have mild productive cough, lung examination is clear to auscultation bilaterally and CTA negative for infiltration or consolidation. No other s/s of infection on physical exam noted.  - CBC in AM  Diet: Heart healthy Code: Full VTE Prophylaxis: Xarelto   Dispo: Admit patient to Inpatient with expected length of stay greater than 2 midnights.  Signed10/26, MD 03/27/2019, 1:11 AM  Pager: [phone]

## 2019-03-27 NOTE — TOC Initial Note (Signed)
Transition of Care [facility]) - Initial/Assessment Note    Patient Details  Name: Keith Potter MRN: [id number] Date of Birth: 24-Sep-1965  Transition of Care [facility]) CM/SW Contact:    Carles Collet, RN Phone Number: 03/27/2019, 12:44 PM  Clinical Narrative:                Spoke to patient. He confirms that he is uninsured and does not have PCP. Consult for xaralto. Patient provided with 30 day coupon. IF DC M-F would benefit from [place]. Request sent to CMA to schedule follow up at [facility], this was discussed with patient.     Expected Discharge Plan: Home/Self Care Barriers to Discharge: Continued Medical Work up   Patient Goals and CMS Choice Patient states their goals for this hospitalization and ongoing recovery are:: to go home   Choice offered to / list presented to : NA  Expected Discharge Plan and Services Expected Discharge Plan: Home/Self Care   Discharge Planning Services: CM Consult, [place] [facility], Medication Assistance, [facility]                                          Prior Living Arrangements/Services   Lives with:: Self                   Activities of Daily Living Home Assistive Devices/Equipment: None ADL Screening (condition at time of admission) Patient's cognitive ability adequate to safely complete daily activities?: No Is the patient deaf or have difficulty hearing?: No Does the patient have difficulty seeing, even when wearing glasses/contacts?: No Does the patient have difficulty concentrating, remembering, or making decisions?: No Patient able to express need for assistance with ADLs?: Yes Does the patient have difficulty dressing or bathing?: No Independently performs ADLs?: Yes (appropriate for developmental age) Does the patient have difficulty walking or climbing stairs?: No Weakness of Legs: None Weakness of Arms/Hands: None  Permission Sought/Granted                  Emotional Assessment               Admission diagnosis:  AKI (acute kidney injury) ([place]) [N17.9] Single subsegmental pulmonary embolism without acute cor pulmonale ([place]) [I26.93] Patient Active Problem List   Diagnosis Date Noted  . Pulmonary emboli ([place]) 03/27/2019  . Hyperlipidemia 01/10/2019  . Healthcare maintenance 01/10/2019  . Pulmonary embolism ([place]) 12/28/2018  . History of DVT (deep vein thrombosis) 12/28/2018   PCP:  Patient, No Pcp Per Pharmacy:   [place] ([place]), [place] - [address] [place] ([place]) [place] [address] Phone: [phone] Fax: [place], [place] - [address] [place] [address] Phone: [phone] Fax: [place], [place] - [address] [facility] [address] [place] [address] Phone: [phone] Fax: [phone]     Social Determinants of Health (SDOH) Interventions    Readmission Risk Interventions No flowsheet data found.

## 2019-03-27 NOTE — Plan of Care (Signed)
  Problem: Education: Goal: Knowledge of General Education information will improve Description: Including pain rating scale, medication(s)/side effects and non-pharmacologic comfort measures Outcome: Progressing   Problem: Health Behavior/Discharge Planning: Goal: Ability to manage health-related needs will improve Outcome: Progressing   Problem: Clinical Measurements: Goal: Ability to maintain clinical measurements within normal limits will improve Outcome: Progressing Goal: Will remain free from infection Outcome: Progressing Goal: Diagnostic test results will improve Outcome: Progressing Goal: Respiratory complications will improve Outcome: Progressing Goal: Cardiovascular complication will be avoided Outcome: Progressing   Problem: Nutrition: Goal: Adequate nutrition will be maintained Outcome: Progressing   Problem: Activity: Goal: Risk for activity intolerance will decrease Outcome: Progressing   Problem: Coping: Goal: Level of anxiety will decrease Outcome: Progressing   Problem: Elimination: Goal: Will not experience complications related to bowel motility Outcome: Progressing Goal: Will not experience complications related to urinary retention Outcome: Progressing   Problem: Pain Managment: Goal: General experience of comfort will improve Outcome: Progressing   Problem: Safety: Goal: Ability to remain free from injury will improve Outcome: Progressing   Problem: Skin Integrity: Goal: Risk for impaired skin integrity will decrease Outcome: Progressing  Patient new admission to unit.  Admission report provided by Will in the ED.  Patient in stable condition upon arrival, he is ambulatory, alert and oriented x4.  Denies SOB, will continue to monitor.

## 2019-03-27 NOTE — Progress Notes (Signed)
ANTICOAGULATION CONSULT NOTE - Initial Consult  Pharmacy Consult for Xarelto Indication: pulmonary embolus  No Known Allergies  Patient Measurements: Height: 6' (182.9 cm) Weight: 210 lb (95.3 kg) IBW/kg (Calculated) : 77.6 Heparin Dosing Weight: 95.3 kg  Vital Signs: Temp: 98.6 F (37 C) (10/02 2135) Temp Source: Oral (10/02 2135) BP: 99/77 (10/03 0045) Pulse Rate: 85 (10/03 0045)  Labs: Recent Labs    03/26/19 2145 03/26/19 2351  HGB 17.0  --   HCT 52.0  --   PLT 198  --   CREATININE 1.81*  --   TROPONINIHS 20* 18*    Estimated Creatinine Clearance: 55.2 mL/min (A) (by C-G formula based on SCr of 1.81 mg/dL (H)).   Medical History: Past Medical History:  Diagnosis Date  . DVT (deep venous thrombosis) ([place])   . Pulmonary emboli (HCC)     Medications:  Scheduled:  . atorvastatin  20 mg Oral Daily  . sodium chloride flush  3 mL Intravenous Once    Assessment: 17 yom presening with R sided calf pain, SOB, and near syncope. CTA today finding small non-occlusive PE in R main pulm artery (of note, CTA on 12/28/18 showing bilateral PE without evidence of RHS). Was on Xarelto PTA but ran out approximately 1 month ago. Received dose of Xarelto 15 mg on 10/3@0049 .  Hgb 17, plt 198. Trop 18. No s/sx of bleeding. Scr 1.81 (CrCl using TBW 62 mL/min).    Goal of Therapy:  Monitor platelets by anticoagulation protocol: Yes   Plan:  Start Xarelto 15 mg twice daily for 21 days then 20 mg daily  Monitor renal function, CBC, and for s/sx of bleeding   Antonietta Jewel, PharmD, BCCCP Clinical Pharmacist   Please check AMION for all [place] phone numbers After 10:00 PM, call [place] [phone] 03/27/2019,1:17 AM

## 2019-03-27 NOTE — Discharge Summary (Signed)
Name: Keith Potter MRN: [id number] DOB: [birth year] 53 y.o. PCP: Patient, No Pcp Per  Date of Admission: 03/26/2019  9:22 PM Date of Discharge:  Attending Physician: Burns Spain, MD  Discharge Diagnosis: 1. Non-occlusive Pulmonary embolism of the right main pulmonary artery evidenced on CTA chest. Restart xarelto starter pack. Follow up in [facility] clinic within 7d of discharge.  Coagulopathy workup during last hospitalization in July. Unfortunately, those studies, done while currently being anticoagulated, are less reliable. Would likely benefit from repeat workup for coagulopathy that would need to occur at least 4 weeks after completing xarelto course.  Discharge Medications: Allergies as of 03/27/2019   No Known Allergies     Medication List    STOP taking these medications   ibuprofen 200 MG tablet Commonly known as: ADVIL   Xarelto Starter Pack 15 & 20 MG Tbpk Generic drug: Rivaroxaban Replaced by: Rivaroxaban 15 MG Tabs tablet     TAKE these medications   atorvastatin 20 MG tablet Commonly known as: LIPITOR Take 1 tablet (20 mg total) by mouth daily.   ondansetron 8 MG disintegrating tablet Commonly known as: Zofran ODT Take 1 tablet (8 mg total) by mouth every 8 (eight) hours as needed for nausea or vomiting.   Rivaroxaban 15 MG Tabs tablet Commonly known as: XARELTO Take 1 tablet (15 mg total) by mouth 2 (two) times daily with a meal for 21 days, THEN 1 tablet (15 mg total) daily for 7 days. Start taking on: March 27, 2019 Replaces: Xarelto Starter Pack 15 & 20 MG Tbpk   tetrahydrozoline 0.05 % ophthalmic solution Place 1 drop into both eyes as needed (dry eyes).       Disposition and follow-up:   [name] was discharged from [facility] in Good condition.  At the hospital follow up visit please address:  1. Non occlusive pulmonary embolism of right main pulm artery.  2.  Labs / imaging needed at time of follow-up: none  3.   Pending labs/ test needing follow-up: none  Follow-up Appointments: Follow-up Information    [facility] Follow up.   Why: YOu will be notified of an appointment time next week.  Contact information: [address] [place] [address] [phone]          Hospital Course by problem list: 1. Pulmonary embolism. 53 y.o. yo male w/ PMH significant for PE/DVT and HLD presenting with shortness of breath and syncope while at work. Patient on long term anticoagulation with Xarelto for DVT/PE in July 2020.  Has not taken it for the past month due to not following up outpatient and had missed scheduled appts. He had right calf pain while at work and subsequently developed dyspnea and lightheadedness. Also endorsed dizziness and feelings of syncope on standing and walking. Patient has also had a cough productive of clear phlegm and mild congestion for past two weeks.   Patient was tachycardic with Afib with RVR vs SVT pattern on arrival that resolved in the ED. Due to concerns for recurrent PE, CTA chest ordered given his high risk. CT showed new small non-occlusive PE in right main pulm artery. Pt subsequently restarted on xarelto. Will need to restart the regimen in light of discontinuation of one month. 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 FH significant for 2nd degree relatives with unprovoked clots at young ages. Coagulopathy panel was performed on last admission however this is not reliable in setting of  anticoagulation and current thrombosis. Coagulopathy panel testing is controversial if patient will need to remain on anticoagulation throughout lifetime. I think it would be reasonable to discuss that matter once patient has been off anticoagulation for a minimum of 4 weeks.  2. AKI. Improved but did not resolve during admission. Should have repeat lab work in 4 weeks.   Discharge Vitals:   BP 104/60   Pulse 73   Temp 98 F  (36.7 C) (Oral)   Resp 16   Ht 6' (1.829 m)   Wt 93.1 kg   SpO2 97%   BMI 27.83 kg/m   Pertinent Labs, Studies, and Procedures:  CTA chest: small non occlusive embolism in right main pulmonary artery.  Discharge Instructions: Discharge Instructions    Activity as tolerated - No restrictions   Complete by: As directed    Diet general   Complete by: As directed       Signed: Mitzi Hansen, MD 03/27/2019, 2:15 PM   Pager: [phone]

## 2019-03-31 ENCOUNTER — Observation Stay (HOSPITAL_COMMUNITY)
Admission: EM | Admit: 2019-03-31 | Discharge: 2019-04-01 | Disposition: A | Discharge status: Home / Self Care | Payer: Self-pay | Attending: Internal Medicine | Admitting: Internal Medicine

## 2019-03-31 ENCOUNTER — Emergency Department (HOSPITAL_BASED_OUTPATIENT_CLINIC_OR_DEPARTMENT_OTHER): Payer: Self-pay

## 2019-03-31 ENCOUNTER — Encounter (HOSPITAL_COMMUNITY): Payer: Self-pay | Admitting: Emergency Medicine

## 2019-03-31 DIAGNOSIS — I82411 Acute embolism and thrombosis of right femoral vein: Secondary | ICD-10-CM

## 2019-03-31 DIAGNOSIS — F17210 Nicotine dependence, cigarettes, uncomplicated: Secondary | ICD-10-CM | POA: Insufficient documentation

## 2019-03-31 DIAGNOSIS — I824Y1 Acute embolism and thrombosis of unspecified deep veins of right proximal lower extremity: Secondary | ICD-10-CM

## 2019-03-31 DIAGNOSIS — Z86711 Personal history of pulmonary embolism: Secondary | ICD-10-CM | POA: Insufficient documentation

## 2019-03-31 DIAGNOSIS — R634 Abnormal weight loss: Secondary | ICD-10-CM | POA: Insufficient documentation

## 2019-03-31 DIAGNOSIS — E785 Hyperlipidemia, unspecified: Secondary | ICD-10-CM | POA: Insufficient documentation

## 2019-03-31 DIAGNOSIS — Z86718 Personal history of other venous thrombosis and embolism: Secondary | ICD-10-CM | POA: Insufficient documentation

## 2019-03-31 DIAGNOSIS — K921 Melena: Secondary | ICD-10-CM | POA: Insufficient documentation

## 2019-03-31 DIAGNOSIS — Z9114 Patient's other noncompliance with medication regimen: Secondary | ICD-10-CM

## 2019-03-31 DIAGNOSIS — M7989 Other specified soft tissue disorders: Secondary | ICD-10-CM

## 2019-03-31 DIAGNOSIS — I82431 Acute embolism and thrombosis of right popliteal vein: Principal | ICD-10-CM | POA: Insufficient documentation

## 2019-03-31 DIAGNOSIS — Z7901 Long term (current) use of anticoagulants: Secondary | ICD-10-CM | POA: Insufficient documentation

## 2019-03-31 DIAGNOSIS — Z20828 Contact with and (suspected) exposure to other viral communicable diseases: Secondary | ICD-10-CM | POA: Insufficient documentation

## 2019-03-31 DIAGNOSIS — M79609 Pain in unspecified limb: Secondary | ICD-10-CM

## 2019-03-31 DIAGNOSIS — I82409 Acute embolism and thrombosis of unspecified deep veins of unspecified lower extremity: Secondary | ICD-10-CM | POA: Diagnosis present

## 2019-03-31 DIAGNOSIS — Z89429 Acquired absence of other toe(s), unspecified side: Secondary | ICD-10-CM | POA: Insufficient documentation

## 2019-03-31 LAB — BASIC METABOLIC PANEL
Anion gap: 12 (ref 5–15)
BUN: 10 mg/dL (ref 6–20)
CO2: 24 mmol/L (ref 22–32)
Calcium: 9.6 mg/dL (ref 8.9–10.3)
Chloride: 106 mmol/L (ref 98–111)
Creatinine, Ser: 1.18 mg/dL (ref 0.61–1.24)
GFR calc Af Amer: >60 mL/min (ref >60)
GFR calc non Af Amer: >60 mL/min (ref >60)
Glucose, Bld: 75 mg/dL (ref 70–99)
Potassium: 4 mmol/L (ref 3.5–5.1)
Sodium: 142 mmol/L (ref 135–145)

## 2019-03-31 LAB — CBC WITH DIFFERENTIAL/PLATELET
Abs Immature Granulocytes: 0.02 10*3/uL (ref 0.00–0.07)
Basophils Absolute: 0.1 10*3/uL (ref 0.0–0.1)
Basophils Relative: 1 %
Eosinophils Absolute: 0.4 10*3/uL (ref 0.0–0.5)
Eosinophils Relative: 5 %
HCT: 47.8 % (ref 39.0–52.0)
Hemoglobin: 16 g/dL (ref 13.0–17.0)
Immature Granulocytes: 0 %
Lymphocytes Relative: 34 %
Lymphs Abs: 3 10*3/uL (ref 0.7–4.0)
MCH: 31.2 pg (ref 26.0–34.0)
MCHC: 33.5 g/dL (ref 30.0–36.0)
MCV: 93.2 fL (ref 80.0–100.0)
Monocytes Absolute: 0.7 10*3/uL (ref 0.1–1.0)
Monocytes Relative: 8 %
Neutro Abs: 4.6 10*3/uL (ref 1.7–7.7)
Neutrophils Relative %: 52 %
Platelets: 180 10*3/uL (ref 150–400)
RBC: 5.13 MIL/uL (ref 4.22–5.81)
RDW: 12.2 % (ref 11.5–15.5)
WBC: 8.7 10*3/uL (ref 4.0–10.5)
nRBC: 0 % (ref 0.0–0.2)

## 2019-03-31 LAB — BRAIN NATRIURETIC PEPTIDE: B Natriuretic Peptide: 23.4 pg/mL (ref 0.0–100.0)

## 2019-03-31 LAB — PROTIME-INR
INR: 1.4 — ABNORMAL HIGH (ref 0.8–1.2)
Prothrombin Time: 16.6 seconds — ABNORMAL HIGH (ref 11.4–15.2)

## 2019-03-31 LAB — APTT: aPTT: 34 seconds (ref 24–36)

## 2019-03-31 LAB — TROPONIN I (HIGH SENSITIVITY): Troponin I (High Sensitivity): 7 ng/L (ref <18)

## 2019-03-31 LAB — HEPARIN LEVEL (UNFRACTIONATED): Heparin Unfractionated: 1.78 IU/mL — ABNORMAL HIGH (ref 0.30–0.70)

## 2019-03-31 MED ORDER — ACETAMINOPHEN 325 MG PO TABS: 650.0000 mg | ORAL_TABLET | Freq: Four times a day (QID) | ORAL | Status: DC | PRN

## 2019-03-31 MED ORDER — IBUPROFEN 400 MG PO TABS: 400.0000 mg | ORAL_TABLET | Freq: Four times a day (QID) | ORAL | Status: DC | PRN

## 2019-03-31 MED ORDER — HEPARIN (PORCINE) 25000 UT/250ML-% IV SOLN
1600.0000 [IU]/h | INTRAVENOUS | Status: DC
  Administered 2019-03-31: 1600 [IU]/h via INTRAVENOUS
  Filled 2019-03-31 (×2): qty 250

## 2019-03-31 MED ORDER — SODIUM CHLORIDE 0.9 % IV BOLUS
500.0000 mL | Freq: Once | INTRAVENOUS | Status: AC
  Administered 2019-03-31: 500 mL via INTRAVENOUS

## 2019-03-31 MED ORDER — ACETAMINOPHEN 650 MG RE SUPP: 650.0000 mg | Freq: Four times a day (QID) | RECTAL | Status: DC | PRN

## 2019-03-31 MED ORDER — KETOROLAC TROMETHAMINE 15 MG/ML IJ SOLN: 15.0000 mg | Freq: Four times a day (QID) | INTRAMUSCULAR | Status: DC | PRN

## 2019-03-31 MED ORDER — SODIUM CHLORIDE 0.9% FLUSH
3.0000 mL | Freq: Two times a day (BID) | INTRAVENOUS | Status: DC
  Administered 2019-04-01: 3 mL via INTRAVENOUS

## 2019-03-31 MED ORDER — SENNOSIDES-DOCUSATE SODIUM 8.6-50 MG PO TABS: 1.0000 | ORAL_TABLET | Freq: Every evening | ORAL | Status: DC | PRN

## 2019-03-31 NOTE — ED Provider Notes (Signed)
[facility] Provider Note   CSN: [id number] Arrival date & time: 03/31/19  1145     History   Chief Complaint Chief Complaint  Patient presents with  . Leg Pain    HPI Keith Potter is a 53 y.o. male.  Past medical history pulmonary embolism, DVT.  Presents emergency room with chief plan of right leg pain.  Patient reports came to emergency department Friday of this past week with a chief complaint of shortness of breath.  Also having some mild right leg pain at the time.  Admission he was found to have acute pulmonary embolism and was initiated on anticoagulation.  Patient reports that he has been taking his Xarelto as prescribed.  States that he is no longer having any chest pain or difficulty breathing however states his right leg pain has been progressively getting worse.  Also has noted significant amount of swelling in his right leg.  No skin discoloration, no fevers or chills.     HPI  Past Medical History:  Diagnosis Date  . DVT (deep venous thrombosis) (HCC)   . Pulmonary emboli [facility](HCC)     Patient Active Problem List   Diagnosis Date Noted  . Pulmonary emboli (HCC) 03/27/2019  . Hyperlipidemia 01/10/2019  . Healthcare maintenance 01/10/2019  . Pulmonary embolism (HCC) 12/28/2018  . History of DVT (deep vein thrombosis) 12/28/2018    Past Surgical History:  Procedure Laterality Date  . AMPUTATION TOE    . APPENDECTOMY    . HERNIA REPAIR          Home Medications    Prior to Admission medications   Medication Sig Start Date End Date Taking? Authorizing Provider  Rivaroxaban (XARELTO) 15 MG TABS tablet Take 1 tablet (15 mg total) by mouth 2 (two) times daily with a meal for 21 days, THEN 1 tablet (15 mg total) daily for 7 days. 03/27/19 04/24/19 Yes Elige Radonhristian, Rylee, MD    Family History No family history on file.  Social History Social History   Tobacco Use  . Smoking status: Current Every Day Smoker     Packs/day: 0.50    Types: Cigarettes  . Smokeless tobacco: Never Used  Substance Use Topics  . Alcohol use: Yes  . Drug use: Yes    Types: Marijuana, Cocaine     Allergies   Patient has no known allergies.   Review of Systems Review of Systems  Constitutional: Negative for chills and fever.  HENT: Negative for ear pain and sore throat.   Eyes: Negative for pain and visual disturbance.  Respiratory: Negative for cough and shortness of breath.   Cardiovascular: Positive for leg swelling. Negative for chest pain and palpitations.  Gastrointestinal: Negative for abdominal pain and vomiting.  Genitourinary: Negative for dysuria and hematuria.  Musculoskeletal: Negative for arthralgias and back pain.  Skin: Negative for color change and rash.  Neurological: Negative for seizures and syncope.  All other systems reviewed and are negative.    Physical Exam Updated Vital Signs BP 123/83 (BP Location: Left Arm)   Pulse 89   Temp 97.9 F (36.6 C) (Oral)   Resp 16   SpO2 99%   Physical Exam Vitals signs and nursing note reviewed.  Constitutional:      Appearance: He is well-developed.  HENT:     Head: Normocephalic and atraumatic.  Eyes:     Conjunctiva/sclera: Conjunctivae normal.  Neck:     Musculoskeletal: Neck supple.  Cardiovascular:     Rate  and Rhythm: Normal rate and regular rhythm.     Heart sounds: No murmur.  Pulmonary:     Effort: Pulmonary effort is normal. No respiratory distress.     Breath sounds: Normal breath sounds.  Abdominal:     Palpations: Abdomen is soft.     Tenderness: There is no abdominal tenderness.  Musculoskeletal:     Comments: RLE: Mild swelling noted in right lower leg, right upper leg, tenderness noted in calf, thigh, good DP and PT pulses LLE: No edema or deformity noted  Skin:    General: Skin is warm and dry.  Neurological:     Mental Status: He is alert.      ED Treatments / Results  Labs (all labs ordered are listed,  but only abnormal results are displayed) Labs Reviewed - No data to display  EKG None  Radiology Vas [place] Lower Extremity Venous (dvt) (only [facility])  Result Date: 03/31/2019  Lower Venous Study Indications: Pain, and Swelling. Other Indications: Patient was positive for acute DVT on 12/28/18 in his entire                    right leg. On 03/26/19, CT scan showed a small nonocclusive                    PE. Patient return to the ED with increase pain in his right                    calf. Anticoagulation: Xarelto. Performing Technologist: Marilynne Halsted RDMS, RVT  Examination Guidelines: A complete evaluation includes B-mode imaging, spectral Doppler, color Doppler, and power Doppler as needed of all accessible portions of each vessel. Bilateral testing is considered an integral part of a complete examination. Limited examinations for reoccurring indications may be performed as noted.  +---------+---------------+---------+-----------+----------+--------------+ RIGHT    CompressibilityPhasicitySpontaneityPropertiesThrombus Aging +---------+---------------+---------+-----------+----------+--------------+ CFV      Full           Yes      Yes                                 +---------+---------------+---------+-----------+----------+--------------+ SFJ      Full                                                        +---------+---------------+---------+-----------+----------+--------------+ FV Prox  None                                         Acute          +---------+---------------+---------+-----------+----------+--------------+ FV Mid   None           Yes      Yes                  Acute          +---------+---------------+---------+-----------+----------+--------------+ FV DistalNone                                         Acute          +---------+---------------+---------+-----------+----------+--------------+  PFV      Full                                                         +---------+---------------+---------+-----------+----------+--------------+ POP      None           No       No                   Acute          +---------+---------------+---------+-----------+----------+--------------+ PTV      Full                                                        +---------+---------------+---------+-----------+----------+--------------+ PERO     Full                                                        +---------+---------------+---------+-----------+----------+--------------+ EIV      Full           Yes      Yes                                 +---------+---------------+---------+-----------+----------+--------------+   +----+---------------+---------+-----------+----------+--------------+ LEFTCompressibilityPhasicitySpontaneityPropertiesThrombus Aging +----+---------------+---------+-----------+----------+--------------+ CFV Full           Yes      Yes                                 +----+---------------+---------+-----------+----------+--------------+ SFJ Full                                                        +----+---------------+---------+-----------+----------+--------------+     Summary: Right: Findings consistent with acute deep vein thrombosis involving the right femoral vein, and right popliteal vein. When compared to prior study dated 12/28/18, there is resolution of DVT in the common femoral vein. The popliteal vein thrombus appears acute, today. Left: No evidence of common femoral vein obstruction.  *See table(s) above for measurements and observations.    Preliminary     Procedures Procedures (including critical care time)  Medications Ordered in ED Medications - No data to display   Initial Impression / Assessment and Plan / ED Course  I have reviewed the triage vital signs and the nursing notes.  Pertinent labs & imaging results that were available during my care of the patient were  reviewed by me and considered in my medical decision making (see chart for details).  Clinical Course as of Mar 30 1656  Wed Mar 31, 2019  1647 Recheck patient, updated on results, will plan for admission back to the internal medicine resident service   [RD]    Clinical Course User Index [RD] Roslynn Amble,  Quitman Livings, MD       53 year old male presents to the ER with worsening right leg pain in setting of recently diagnosed pulmonary embolism.  Patient reports compliance with his Xarelto.  Ultrasound today was concerning for acute DVT in right femoral and popliteal vein.  No documentation of lower extremity duplex on last admission, concerned patient may have worsening clot burden despite taking Xarelto.  Will admit for further management.  Consulted pharmacy for heparin.  Discussed case with the internal medicine residents, they will evaluate patient for admission.   Final Clinical Impressions(s) / ED Diagnoses   Final diagnoses:  Acute deep vein thrombosis (DVT) of proximal vein of right lower extremity [facility])    ED Discharge Orders    None       Milagros Loll, MD 03/31/19 1705

## 2019-03-31 NOTE — ED Triage Notes (Signed)
Pt has worsening pain in his right lower calf where he has a known dvt. Pt states th eleg is now more swollen and still taking his blood thinner.

## 2019-03-31 NOTE — ED Notes (Signed)
Dinner tray ordered.

## 2019-03-31 NOTE — ED Notes (Signed)
Pt readjusted on stretcher and pillow provided, lights turned down per pts request

## 2019-03-31 NOTE — ED Notes (Signed)
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

## 2019-03-31 NOTE — H&P (Signed)
Date: 03/31/2019               Patient Name:  Keith Potter MRN: [id number]  DOB: August 12, 1965 Age / Sex: 53 y.o., male   PCP: Patient, No Pcp Per         Medical Service: Internal Medicine Teaching Service         Attending Physician: Dr. Velna Ochs, MD    First Contact: Dr. Darrick Meigs  Pager: [phone]  Second Contact: Dr. Earl Lites Pager: [phone]       After Hours (After 5p/  First Contact Pager: [phone]  weekends / holidays): Second Contact Pager: [phone]   Chief Complaint: Right leg pain  History of Present Illness: Keith Potter is a 53 y/o male with a PMHx of DVT x3, PE x2 on Xarelto for the past 5 days who presents to the ED with c/o right leg pain.   Keith Potter reports he was recently discharged from the hospital for a PE, that presented with significant SOB. Since discharge, his SOB has resolved and not re-occurred. However, he has had persistent right calf pain and swelling. The pain is exacerbated by movement and alleviated by staying still. He denies radiation of pain, including any chest pain, right thigh pain or left leg pain. He notes his calf pain has been chronic since his first DVT about 5 years ago. He actually came in today because he was concerned that his leg pain had not improved since he has been compliant with Xarelto since discharge. He denies any skin changes, headache, dizziness, vision changes, palpitations, N/V/D, abdominal pain or urinary changes. For his job, he walks a lot all day. No periods of immobility.   He does endorse a 39 lb weight loss in the past 6 months that has been unintentional. He denies changes in appetite, changes in exercise level, anxiety or night sweats. He also endorses intermittent hematochezia that has been occurring since 2018, at which time he had a colonoscopy for work up of the same issue. The blood is described as bright red, and on the toilet paper upon wiping as well as mixed within the stool. It occasionally does  turn the toilet bowl red. Denies any pain with bowel movements or changes in bowel movements. No family history of colon cancer. He used to take Advil daily but has not taken it in 3 weeks now.   ED Course:  While at the ED, doppler ultrasound of the right lower extremity was ordered and found to be acutely positive. Lab works was ordered but pending upon our interview.   Meds: Current Meds  Medication Sig  . Rivaroxaban (XARELTO) 15 MG TABS tablet Take 1 tablet (15 mg total) by mouth 2 (two) times daily with a meal for 21 days, THEN 1 tablet (15 mg total) daily for 7 days.   Allergies: Allergies as of 03/31/2019  . (No Known Allergies)   Past Medical History:  Diagnosis Date  . DVT (deep venous thrombosis) ([place])   . Pulmonary emboli (HCC)    Family History:  2nd cousins with unprovoked clots at young ages Mother: Diabetes Father: Diabetes  Sister: Diabetes Sister: Heart Failure  Uncle: Leukemia   No family history of colon cancer No family history of MI or CVA.   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  Physical Exam: Blood pressure (!) 142/79, pulse (!) 56, temperature 97.9 F (36.6 C), temperature source Oral, resp. rate 16, SpO2 98 %.  Physical Exam Constitutional:      General: He is not in acute distress.    Appearance: He is normal weight. He is not ill-appearing, toxic-appearing or diaphoretic.  HENT:     Head: Normocephalic and atraumatic.     Mouth/Throat:     Mouth: Mucous membranes are moist.     Pharynx: Oropharynx is clear. No oropharyngeal exudate or posterior oropharyngeal erythema.  Eyes:     General: No scleral icterus.    Conjunctiva/sclera: Conjunctivae normal.     Pupils: Pupils are equal, round, and reactive to light.  Cardiovascular:     Rate and  Rhythm: Normal rate. Rhythm irregular.     Heart sounds: No murmur.  Pulmonary:     Effort: Pulmonary effort is normal. No respiratory distress.     Breath sounds: Normal breath sounds. No wheezing, rhonchi or rales.  Abdominal:     General: Bowel sounds are normal. There is no distension.     Palpations: Abdomen is soft.     Tenderness: There is no abdominal tenderness. There is no guarding.  Musculoskeletal:        General: No swelling or tenderness.     Right lower leg: No edema.     Left lower leg: No edema.  Skin:    General: Skin is warm and dry.     Findings: No bruising, erythema or rash.  Neurological:     General: No focal deficit present.     Mental Status: He is alert and oriented to person, place, and time. Mental status is at baseline.  Psychiatric:        Mood and Affect: Mood normal.        Behavior: Behavior normal.    EKG: personally reviewed my interpretation is: Sinus rhythm. No ST elevation or T wave abnormality.   Assessment & Plan by Problem: Active Problems:   DVT (deep venous thrombosis) [facility](HCC)  Keith Potter is a 53 y/o male with recent history of unprovoked DVT and PE x3, with the second PE just this past week. He is currently being treated with Xarelto. He is being admitted for IV heparin treatment for unprovoked DVT while on anti-coagulation.   # DVT, right femoral vein and right popliteal vein [facility] for a non-occlusive PE of the right main pulmonary artery that occurred after not taking Xarelto for 1 month. 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 He had a previous coagulation workup, but was done when he was on anti-coagulant, so unreliable. Family history significant for 2nd degree relatives with unprovoked clots at young ages, but no other indications this may be a genetic disorder. His unintentional weight loss is concerning for possible underlying  malignancy. His intermittent GI bleeding may point towards colon malignancy, but colonoscopy only 2 years ago did not find evidence of this. He will needed additional age-appropriate cancer screening, but can be done outpatient.   Given that he has failed Xarelto therapy, Heparin therapy will be started. On discharge, will need to consider increase Xarelto dose, vs alternative agents such as Warfarin, although this would be difficult with inconsistent follow up.   - Heparin, dosing per pharmacy - CBC, CMP, APTT, INR in the A.M.  -  Telemetry   # Hyperlipidemia:  Lipid panel in July 2020 shows elevated cholesterol at 214 and LDL at 160. He has not been taking his Lipitor due to cost.   Diet: Regular  DVT ppx: Heparin drip Code: Full   Dispo: Admit patient to Outpatient with expected length of stay less than 2 midnights.  Signed: Dr. Verdene Lennert Internal Medicine PGY-1  Pager: [phone] 03/31/2019, 6:57 PM

## 2019-03-31 NOTE — Progress Notes (Signed)
Right lower ext venous  has been completed. Refer to [facility] under chart review to view preliminary results.  03/31/2019  4:17 PM Mia Winthrop, Bonnye Fava

## 2019-03-31 NOTE — Progress Notes (Addendum)
ANTICOAGULATION CONSULT NOTE - Initial Consult  Pharmacy Consult for Heparin Indication: pulmonary embolus and DVT  No Known Allergies  Patient Measurements:   Heparin Dosing Weight: 95.3 Kg  Vital Signs: Temp: 97.9 F (36.6 C) (10/07 1148) Temp Source: Oral (10/07 1148) BP: 142/79 (10/07 1600) Pulse Rate: 56 (10/07 1600)  Labs: No results for input(s): HGB, HCT, PLT, APTT, LABPROT, INR, HEPARINUNFRC, HEPRLOWMOCWT, CREATININE, CKTOTAL, CKMB, TROPONINIHS in the last 72 hours.  Estimated Creatinine Clearance: 65 mL/min (A) (by C-G formula based on SCr of 1.41 mg/dL (H)).   Medical History: Past Medical History:  Diagnosis Date  . DVT (deep venous thrombosis) ([place])   . Pulmonary emboli [facility])      Assessment: Pt is a 53 y/o Male who presents with worsening pain in right lower calf where he has a known DVT. Pt was discharged on 10/03 with nonocclusive PE of right main pulmonary artery and DVT. Vascular US shows acute DVT involving right femoral vein and righ popliteal vein. Previous DVT in common femoral vein has resolved.  Pt was on Xarelto PTA, last dose 0530 on 10/07 per patient.   Goal of Therapy:  Heparin level 0.3-0.7 units/ml  APTT goal 66-102.  Monitor platelets by anticoagulation protocol: Yes   Plan:  Will not bolus due to recent Xarelto exposure Start heparin infusion at 1600 units/hr Check aptt in 6 hours and daily while on heparin Check daily heparin levels Continue to monitor H&H and platelets  Will dose based on aPTT due to recent xarelto use Watch for signs/symptoms of bleeding while on heparin  Sherren Kerns, PharmD PGY1 [place] Resident 03/31/2019,4:28 PM

## 2019-04-01 ENCOUNTER — Other Ambulatory Visit: Payer: Self-pay

## 2019-04-01 ENCOUNTER — Encounter (HOSPITAL_COMMUNITY): Payer: Self-pay

## 2019-04-01 LAB — COMPREHENSIVE METABOLIC PANEL
ALT: 18 U/L (ref 0–44)
AST: 18 U/L (ref 15–41)
Albumin: 3.1 g/dL — ABNORMAL LOW (ref 3.5–5.0)
Alkaline Phosphatase: 88 U/L (ref 38–126)
Anion gap: 10 (ref 5–15)
BUN: 14 mg/dL (ref 6–20)
CO2: 24 mmol/L (ref 22–32)
Calcium: 9.1 mg/dL (ref 8.9–10.3)
Chloride: 108 mmol/L (ref 98–111)
Creatinine, Ser: 1.25 mg/dL — ABNORMAL HIGH (ref 0.61–1.24)
GFR calc Af Amer: >60 mL/min (ref >60)
GFR calc non Af Amer: >60 mL/min (ref >60)
Glucose, Bld: 106 mg/dL — ABNORMAL HIGH (ref 70–99)
Potassium: 3.7 mmol/L (ref 3.5–5.1)
Sodium: 142 mmol/L (ref 135–145)
Total Bilirubin: 0.8 mg/dL (ref 0.3–1.2)
Total Protein: 6.4 g/dL — ABNORMAL LOW (ref 6.5–8.1)

## 2019-04-01 LAB — CBG MONITORING, ED: Glucose-Capillary: 119 mg/dL — ABNORMAL HIGH (ref 70–99)

## 2019-04-01 LAB — PROTIME-INR
INR: 1.2 (ref 0.8–1.2)
Prothrombin Time: 15.5 seconds — ABNORMAL HIGH (ref 11.4–15.2)

## 2019-04-01 LAB — CBC
HCT: 44.5 % (ref 39.0–52.0)
Hemoglobin: 14.9 g/dL (ref 13.0–17.0)
MCH: 31.4 pg (ref 26.0–34.0)
MCHC: 33.5 g/dL (ref 30.0–36.0)
MCV: 93.7 fL (ref 80.0–100.0)
Platelets: 173 10*3/uL (ref 150–400)
RBC: 4.75 MIL/uL (ref 4.22–5.81)
RDW: 12.1 % (ref 11.5–15.5)
WBC: 9.2 10*3/uL (ref 4.0–10.5)
nRBC: 0 % (ref 0.0–0.2)

## 2019-04-01 LAB — SARS CORONAVIRUS 2 (TAT 6-24 HRS): SARS Coronavirus 2: NEGATIVE

## 2019-04-01 LAB — HEPARIN LEVEL (UNFRACTIONATED): Heparin Unfractionated: 0.62 IU/mL (ref 0.30–0.70)

## 2019-04-01 LAB — APTT
aPTT: 54 seconds — ABNORMAL HIGH (ref 24–36)
aPTT: 78 seconds — ABNORMAL HIGH (ref 24–36)

## 2019-04-01 LAB — TROPONIN I (HIGH SENSITIVITY): Troponin I (High Sensitivity): 7 ng/L (ref <18)

## 2019-04-01 MED ORDER — ENOXAPARIN SODIUM 100 MG/ML ~~LOC~~ SOLN
90.0000 mg | Freq: Two times a day (BID) | SUBCUTANEOUS | Status: DC
  Administered 2019-04-01: 90 mg via SUBCUTANEOUS
  Filled 2019-04-01: qty 1
  Filled 2019-04-01: qty 0.9

## 2019-04-01 MED FILL — XARELTO STARTER PACK: 15 & 20 | 30 days supply | Qty: 51 | Fill #0

## 2019-04-01 NOTE — ED Notes (Signed)
Per pharmacy keep heparin at same rate, will recheck PTT later in AM

## 2019-04-01 NOTE — Progress Notes (Addendum)
NAME:  Keith Potter, MRN:  [id number], DOB:  08-22-1965, LOS: 0 ADMISSION DATE:  03/31/2019,  Primary: Patient, No Pcp Per  CHIEF COMPLAINT:  Right leg pain  Medical Service: Internal Medicine Teaching Service         Attending Physician: Dr. Reymundo Poll, MD    First Contact: Dr. Ephriam Knuckles Pager: [phone]  Second Contact: Dr. Cleaster Corin Pager: [phone]       After Hours (After 5p/  First Contact Pager: [phone]  weekends / holidays): Second Contact Pager: [phone]    Brief History  53 yo male present with right leg pain in setting of recent unprovoked DVT/PE (recent admission 10/2-10/3).  Subjective  No complaints today. Notes the leg pain has been present since previous admission and has not worsened. Endorses compliance with Xarelto since 10/3 discharge.    Significant Hospital Events   Admission 10/7  Objective   Blood pressure 127/76, pulse (!) 57, temperature 97.8 F (36.6 C), temperature source Oral, resp. rate 18, height 6' (1.829 m), weight 93.8 kg, SpO2 99 %.     Intake/Output Summary (Last 24 hours) at 04/01/2019 1606 Last data filed at 04/01/2019 1000 Gross per 24 hour  Intake 3 ml  Output -  Net 3 ml   Filed Weights   04/01/19 0931  Weight: 93.8 kg    Examination: GENERAL: in no acute distress CARDIAC: heart RRR. +1 edema in lower extremities PULMONARY: acyanotic. Lung sounds clear to auscultation. ABDOMEN: soft. Nontender to palpation.   NEURO: CN II-XII grossly intact. SKIN: no rash or lesions on limited exam   Significant Diagnostic Tests:  10/7 lower extremity doppler US. Acute DVT involving right femoral fein and right popliteal vein. Improvement in right common femoral vein. Popliteal thrombosis appears acute. No femoral vein obstruction apparent on left.   Labs   CBC: Recent Labs  Lab 03/26/19 2145 03/27/19 0520 03/31/19 1720 04/01/19 0427  WBC 14.1* 10.0 8.7 9.2  NEUTROABS  --   --  4.6  --   HGB 17.0 14.8 16.0 14.9  HCT 52.0 44.5  47.8 44.5  MCV 93.7 93.9 93.2 93.7  PLT 198 147* 180 173    Basic Metabolic Panel: Recent Labs  Lab 03/26/19 2145 03/27/19 0520 03/31/19 1720 04/01/19 0427  NA 139 138 142 142  K 4.2 3.6 4.0 3.7  CL 103 110 106 108  CO2 22 21* 24 24  GLUCOSE 186* 123* 75 106*  BUN 9 15 10 14   CREATININE 1.81* 1.41* 1.18 1.25*  CALCIUM 9.8 8.4* 9.6 9.1   GFR: Estimated Creatinine Clearance: 79.4 mL/min (A) (by C-G formula based on SCr of 1.25 mg/dL (H)). 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 Recent admission 10/2-10/3 for DVT/PE. Prior DVTs in 2015 and July 2020. Admitted for IV heparin as management.  Assessment & Plan:  Active Problems:   DVT (deep venous thrombosis) (HCC)  Unprovoked acute right popliteal DVT. Recent hosp for PE 2/2 DVT of common femoral vein. The right popliteal DVT appears new and was not present on previous imaging. This occurred while on xarelto which he has been taking since 10/3. FH signignificant for unprovoked clots. Previous workup for  hypercoagulability performed during anticoagulation making it unreliable. 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  Discussed with pharmacy. Given the short period of time he was on xarelto and no imaging on prior admission to indicate that this is a new DVT since starting xarelto, would not consider to be failure of therapy.  Plan Continue xarelto.  Can discharge later today.  Best practice:  CODE STATUS: full Diet: regular DVT for prophylaxis: xarelto Social considerations/Family communication: will need close outpatient follow up.  Dispo:d/c today   Mitzi Hansen, MD [place] PGY-1 PAGER #: [phone]  @TODAY @ 4:06 PM

## 2019-04-01 NOTE — Progress Notes (Addendum)
ANTICOAGULATION CONSULT NOTE   Pharmacy Consult for Heparin to Lovenox  Indication: pulmonary embolus and DVT  No Known Allergies  Patient Measurements:   Heparin Dosing Weight: 95.3 Kg  Vital Signs: BP: 121/67 (10/08 0656) Pulse Rate: 79 (10/08 0656)  Labs: Recent Labs    03/31/19 1720 03/31/19 2330 03/31/19 2341 04/01/19 0427  HGB 16.0  --   --  14.9  HCT 47.8  --   --  44.5  PLT 180  --   --  173  APTT 34 54*  --  78*  LABPROT 16.6*  --   --  15.5*  INR 1.4*  --   --  1.2  HEPARINUNFRC 1.78*  --   --  0.62  CREATININE 1.18  --   --  1.25*  TROPONINIHS 7  --  7  --     Estimated Creatinine Clearance: 73.3 mL/min (A) (by C-G formula based on SCr of 1.25 mg/dL (H)).   Medical History: Past Medical History:  Diagnosis Date  . DVT (deep venous thrombosis) ([place])   . Pulmonary emboli [facility])      Assessment: Pt is a 53 y/o Male who presents with worsening pain in right lower calf where he has a known DVT. Pt was discharged on 10/03 with nonocclusive PE of right main pulmonary artery and DVT. Vascular US shows acute DVT involving right femoral vein and righ popliteal vein. Previous DVT in common femoral vein has resolved.  Pt was on Xarelto PTA, last dose 0530 on 10/07 per patient.   Goal of Therapy:  Heparin level 0.3-0.7 units/ml  APTT goal 66-102.  Monitor platelets by anticoagulation protocol: Yes   Plan:  - Consulted to switch from heparin to Lovenox  - Will D/c heparin drip and time administration of Lovenox for 1 hour after drip has been stopped - Will give Lovenox 90 mg subq every 12 hours  - If long term therapy consider obtaining a lovenox Xa level.  - Continue to monitor H&H and platelets  Duanne Limerick. PharmD. BCPS  04/01/2019,7:13 AM

## 2019-04-01 NOTE — Discharge Instructions (Signed)

## 2019-04-01 NOTE — Progress Notes (Signed)
Pharmacy student rounding with IMTP-1/ [employer] [facility]. Have discussed with Dr. Darrick Meigs and Dr. Philipp Ovens treatment option for this patient. Decision has been made to continue Xarelto for DVT treatment. Have procured from the transitions of care pharmacy a new 30 day starter pack of Xarelto. This was delivered to the patients bedside 15 minutes total time was spent with the patient teaching indication, dose administration, side effects of medication, as well as clinical signs of bleeding or embolic complications. Patient was given a chance to ask and have any questions answered. Patient will need an appointment in the Internal Medicine Clinic within the next 1-2 weeks before his starter pack is completed. He will need to meet with a financial counselor and Dr. Maudie Mercury to get additional medication from manufacturer at no cost if he qualifies.  [employer]

## 2019-04-01 NOTE — ED Notes (Signed)
TELE 

## 2019-04-01 NOTE — Progress Notes (Addendum)
Received message to assist patient with getting xarelto. Patient had already used xarelto card. Match placed with request for override. TOC pharmacy made aware. Patient to follow up with [facility] for follow up care.  Manya Silvas, MSN RN CCM Transitions of Care [phone]

## 2019-04-01 NOTE — ED Notes (Signed)
Breakfast Tray Ordered. 

## 2019-04-01 NOTE — Discharge Summary (Signed)
Name: Keith Potter MRN: [id number] DOB: 1966/01/01 53 y.o. PCP: Patient, No Pcp Per  Date of Admission: 03/31/2019 11:49 AM Date of Discharge: 04/01/19 Attending Physician: Velna Ochs, MD  Discharge Diagnosis: 1. DVT of right popliteal vein  Discharge Medications: Allergies as of 04/01/2019   No Known Allergies     Medication List    TAKE these medications   Rivaroxaban 15 MG Tabs tablet Commonly known as: XARELTO Take 1 tablet (15 mg total) by mouth 2 (two) times daily with a meal for 21 days, THEN 1 tablet (15 mg total) daily for 7 days. Start taking on: March 27, 2019       Disposition and follow-up:   [name] was discharged from [facility] in Stable condition.  At the hospital follow up visit please address:  1.  DVT of right popliteal vein. 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 Will need to continue xarelto indefinitely. Would benefit from age appropriate screening. Would not likely benefit from hypercoagulable workup in light of requiring xarelto indefinitely.   2.  Labs / imaging needed at time of follow-up: age appropriate cancer screening  3.  Pending labs/ test needing follow-up: none  Follow-up Appointments: Follow-up Information    Keith Sawa, MD Follow up in 14 day(s).   Specialty: Internal Medicine Contact information: [address] [address] [phone]           Hospital Course: DVT of right popliteal vein. Patient presented to ED on 03/31/19 with complaints of right leg pain. Was recently admitted 10/2-10/3 for PE 2/2 common femoral DVT that was discovered in July (no vascular ultrasound done during that admission). Patient had been non compliant with xarelto up to the time of his last admit 10/2. He had been taking the xarelto consistently since that time (10/3). Upon this admission, he was found to have an acute DVT of his right popliteal vein which was not  present on vascular ultrasound in July.  There was discussion as to whether this was to be considered a treatment failure or not. Case discussed with pharmacy who did not believe this was treatment failure in light of the limited time for which he was taking the xarelto prior to presenting with this new DVT. Additionally, given the absence of LE vascular imaging on last admission, it would be difficult to delineate the particular acuity of the popliteal DVT as to whether it occurred before or after starting the xarelto.  Discussed this with patient. Reiterated the importance of compliance with xarelto and potential complications of not complying with strict regimen. Pharmacy provided further education regarding this matter as well. We also discussed the possible etiologies of of the hypercoaguable state. I don't believe further genetic testing is necessary given the need for indefinite anticoagulation needs as well as his gender and age. I did encourage him to seek age appropriate screening for cancer as well which we can discuss at his follow up appointment in our clinic.  Discharge Vitals:   BP 127/76   Pulse (!) 57   Temp 97.8 F (36.6 C) (Oral)   Resp 18   Ht 6' (1.829 m)   Wt 93.8 kg   SpO2 99%   BMI 28.04 kg/m   Pertinent Labs, Studies, and Procedures:  LE duplex: improvement in right common femoral DVT. New DVT in the right popliteal vein.  Discharge Instructions: Discharge Instructions    Increase activity slowly   Complete  by: As directed       Signed: Elige Radon, MD 04/01/2019, 4:54 PM   Pager: [phone]

## 2019-04-07 ENCOUNTER — Inpatient Hospital Stay: Payer: Self-pay

## 2019-04-23 ENCOUNTER — Ambulatory Visit: Payer: Self-pay | Attending: Internal Medicine | Admitting: Internal Medicine

## 2019-04-23 ENCOUNTER — Other Ambulatory Visit: Payer: Self-pay

## 2019-04-23 ENCOUNTER — Encounter: Payer: Self-pay | Admitting: Internal Medicine

## 2019-04-23 VITALS — BP 119/79 | HR 63 | Temp 98.1°F | Resp 16 | Ht 72.0 in | Wt 219.4 lb

## 2019-04-23 DIAGNOSIS — F17200 Nicotine dependence, unspecified, uncomplicated: Secondary | ICD-10-CM | POA: Insufficient documentation

## 2019-04-23 DIAGNOSIS — F1290 Cannabis use, unspecified, uncomplicated: Secondary | ICD-10-CM | POA: Insufficient documentation

## 2019-04-23 DIAGNOSIS — Z2821 Immunization not carried out because of patient refusal: Secondary | ICD-10-CM

## 2019-04-23 DIAGNOSIS — Z86711 Personal history of pulmonary embolism: Secondary | ICD-10-CM | POA: Insufficient documentation

## 2019-04-23 DIAGNOSIS — I82431 Acute embolism and thrombosis of right popliteal vein: Secondary | ICD-10-CM

## 2019-04-23 DIAGNOSIS — K625 Hemorrhage of anus and rectum: Secondary | ICD-10-CM

## 2019-04-23 MED ORDER — RIVAROXABAN 20 MG PO TABS: 20.0000 mg | ORAL_TABLET | Freq: Every day | ORAL | 2 refills | Status: DC

## 2019-04-23 NOTE — Patient Instructions (Addendum)
Please sign a release at the front desk for [place] to get your colonoscopy report from Dr. Margaretmary Bayley at [address] [place], [place] [address].  FAX [phone], Phone [phone]. Please take the Xarelto every day as prescribed.

## 2019-04-23 NOTE — Progress Notes (Signed)
Patient ID: Keith Potter, male    DOB: [birth year]  MRN: [id number]  CC: Hospitalization Follow-up   Subjective: Keith Potter is a 53 y.o. male who presents for new pt visit and hosp f/u His concerns today include:  Patient with history of previous DVT/PE, HL  Previous PCP was Dr. Eliberto Ivory in [facility].  last seen 5 yrs ago  Patient admitted 10/7-01/2019 with acute DVT of the right popliteal vein.  He was discharged home on Xarelto.  Further review of of his records show that he was admitted 12/2018 with unprovoked bilateral pulmonary embolus and RLE DVT.  He was discharged on Xarelto and had followed up with the internal medicine residency clinic.  Hypercoagulable work-up was not done.  He was readmitted earlier this month with pulmonary embolism of the right main pulmonary artery.  He was restarted on Xarelto.  Prior to this last admission.  He had ran out of Xarelto and had not followed up.    Today: Reports compliance with Xarelto but missed 4 doses of the 15 mg tab.  He is supposed to start the 20 mg once daily dosing tomorrow. No bruising/bleeding. No hematuria. Occasional blood on toilet paper after BMs x 2 yrs.  He has not paid attention as to whether he has to strain at the times when he noticed blood.  Last noticed about 1 mth ago and was not on Xarelto at tha time. Had c-scope in 2018 in [place].  I can see where he had it done by looking at  Care Everywhere but the actual report is not accessible to me.  I see that he was told to have a repeat colonoscopy in 5 years.  Patient thinks that the colonoscopy was normal.   No fhx of colon CA.  Had blood clot in both legs 5 yrs ago.  No cause determined Had a few 2nd cousins with blood clot.    Tob:  1 pk last 3 days.  Smoked for 27 yrs.  Quit for 9 yrs in 1992.  Restarted after he moved back to  [place] trail of quitting again.  He feels he can quit on his own. Hx of marj and cocaine use.  Last used cocaine 1 yr ago,  marij 2 x a wk  Patient Active Problem List   Diagnosis Date Noted  . DVT (deep venous thrombosis) (HCC) 03/31/2019  . Pulmonary emboli (HCC) 03/27/2019  . Hyperlipidemia 01/10/2019  . Healthcare maintenance 01/10/2019  . Pulmonary embolism (HCC) 12/28/2018  . History of DVT (deep vein thrombosis) 12/28/2018     Current Outpatient Medications on File Prior to Visit  Medication Sig Dispense Refill  . Rivaroxaban (XARELTO) 15 MG TABS tablet Take 1 tablet (15 mg total) by mouth 2 (two) times daily with a meal for 21 days, THEN 1 tablet (15 mg total) daily for 7 days. 42 tablet 0   No current facility-administered medications on file prior to visit.     No Known Allergies  Social History   Socioeconomic History  . Marital status: Single    Spouse name: Not on file  . Number of children: Not on file  . Years of education: Not on file  . Highest education level: Not on file  Occupational History  . Not on file  Social Needs  . Financial resource strain: Not on file  . Food insecurity    Worry: Not on file    Inability: Not on file  .  Transportation needs    Medical: Not on file    Non-medical: Not on file  Tobacco Use  . Smoking status: Current Every Day Smoker    Packs/day: 0.50    Types: Cigarettes  . Smokeless tobacco: Never Used  Substance and Sexual Activity  . Alcohol use: Not Currently  . Drug use: Yes    Types: Marijuana, Cocaine  . Sexual activity: Not on file  Lifestyle  . Physical activity    Days per week: Not on file    Minutes per session: Not on file  . Stress: Not on file  Relationships  . Social Musicianconnections    Talks on phone: Not on file    Gets together: Not on file    Attends religious service: Not on file    Active member of club or organization: Not on file    Attends meetings of clubs or organizations: Not on file    Relationship status: Not on file  . Intimate partner violence    Fear of current or ex partner: Not on file    Emotionally  abused: Not on file    Physically abused: Not on file    Forced sexual activity: Not on file  Other Topics Concern  . Not on file  Social History Narrative  . Not on file    History reviewed. No pertinent family history.  Past Surgical History:  Procedure Laterality Date  . AMPUTATION TOE    . APPENDECTOMY    . HERNIA REPAIR      ROS: Review of Systems Negative except as stated above  PHYSICAL EXAM: BP 119/79   Pulse 63   Temp 98.1 F (36.7 C) (Oral)   Resp 16   Ht 6' (1.829 m)   Wt 219 lb 6.4 oz (99.5 kg)   SpO2 98%   BMI 29.76 kg/m   Physical Exam General appearance - alert, well appearing, middle-aged African-American male and in no distress Mental status - normal mood, behavior, speech, dress, motor activity, and thought processes Eyes - pupils equal and reactive, extraocular eye movements intact Mouth - mucous membranes moist, pharynx normal without lesions Neck - supple, no significant adenopathy Chest - clear to auscultation, no wheezes, rales or rhonchi, symmetric air entry Heart - normal rate, regular rhythm, normal S1, S2, no murmurs, rubs, clicks or gallops Rectal -no external lesions noted.  No external hemorrhoids. Musculoskeletal -trace edema of the right lower extremity  extremities -trace edema of the right lower extremity.  CMP Latest Ref Rng & Units 04/01/2019 03/31/2019 03/27/2019  Glucose 70 - 99 mg/dL 409(W106(H) 75 119(J123(H)  BUN 6 - 20 mg/dL 14 10 15   Creatinine 0.61 - 1.24 mg/dL 4.78(G1.25(H) 9.561.18 2.13(Y1.41(H)  Sodium 135 - 145 mmol/L 142 142 138  Potassium 3.5 - 5.1 mmol/L 3.7 4.0 3.6  Chloride 98 - 111 mmol/L 108 106 110  CO2 22 - 32 mmol/L 24 24 21(L)  Calcium 8.9 - 10.3 mg/dL 9.1 9.6 8.6(V8.4(L)  Total Protein 6.5 - 8.1 g/dL 6.4(L) - -  Total Bilirubin 0.3 - 1.2 mg/dL 0.8 - -  Alkaline Phos 38 - 126 U/L 88 - -  AST 15 - 41 U/L 18 - -  ALT 0 - 44 U/L 18 - -   Lipid Panel     Component Value Date/Time   CHOL 214 (H) 12/28/2018 2042   TRIG 75 12/28/2018  2042   HDL 39 (L) 12/28/2018 2042   CHOLHDL 5.5 12/28/2018 2042   VLDL 15 12/28/2018  2042   LDLCALC 160 (H) 12/28/2018 2042    CBC    Component Value Date/Time   WBC 9.2 04/01/2019 0427   RBC 4.75 04/01/2019 0427   HGB 14.9 04/01/2019 0427   HCT 44.5 04/01/2019 0427   HCT 42.8 12/29/2018 0234   PLT 173 04/01/2019 0427   MCV 93.7 04/01/2019 0427   MCH 31.4 04/01/2019 0427   MCHC 33.5 04/01/2019 0427   RDW 12.1 04/01/2019 0427   LYMPHSABS 3.0 03/31/2019 1720   MONOABS 0.7 03/31/2019 1720   EOSABS 0.4 03/31/2019 1720   BASOSABS 0.1 03/31/2019 1720    ASSESSMENT AND PLAN:  1. Acute deep vein thrombosis (DVT) of popliteal vein of right lower extremity (HCC) Advised patient to finish the 2 days of 15 mg Xarelto pills that he has then stopped the 20 mg daily. Advised to report any easy bruising or bleeding. 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 - rivaroxaban (XARELTO) 20 MG TABS tablet; Take 1 tablet (20 mg total) by mouth daily with supper.  Dispense: 30 tablet; Refill: 2  2. History of pulmonary embolism See #1 above - rivaroxaban (XARELTO) 20 MG TABS tablet; Take 1 tablet (20 mg total) by mouth daily with supper.  Dispense: 30 tablet; Refill: 2  3. Tobacco dependence Advised to quit.  Discussed health risks associated with smoking.  Patient plans to give a trial of quitting.  He states he will do this on his own.  Less than 5 minutes spent on counseling.  4. Marijuana user Discouraged use of all street drugs  5. Influenza vaccination declined   6. Rectal bleed -Chronic.  He has had colonoscopy within the past 2 years.  Recent hemoglobin normal. We will have patient sign a release at the front desk for [place] to get his colonoscopy report.  I have included the name of the physician, the address, fax and telephone number as noted in Ep[employer] to make it easier for our front desk to get this information for me. -In the meantime I  recommend keeping bowel movements soft and regular by increasing fiber in the diet.  Can also use stool softeners like MiraLAX or docusate which can be purchased over-the-counter. Advised to apply for the orange card/cone discount card in the event we do need to refer him back to a gastroenterology.   Patient was given the opportunity to ask questions.  Patient verbalized understanding of the plan and was able to repeat key elements of the plan.   No orders of the defined types were placed in this encounter.    Requested Prescriptions   Signed Prescriptions Disp Refills  . rivaroxaban (XARELTO) 20 MG TABS tablet 30 tablet 2    Sig: Take 1 tablet (20 mg total) by mouth daily with supper.    Return in about 2 months (around 06/23/2019).  Karle Plumber, MD, FACP

## 2019-05-05 ENCOUNTER — Encounter: Payer: Self-pay | Admitting: Internal Medicine

## 2019-05-05 NOTE — Progress Notes (Signed)
Received colonoscopy report from [facility] done 01/06/2017. 2 tubular adenoma polyps removed the largest one being 8 mm.  Also showed mild diverticulosis of sigmoid colon and internal hemorrhoids.  Repeat in 3-5 yrs.  Done by Dr.Rami Badreddine.

## 2019-05-26 MED FILL — XARELTO 20 MG TABLET: 20 | 30 days supply | Qty: 30 | Fill #0

## 2019-06-07 MED FILL — XARELTO 20 MG TABLET: 20 | 30 days supply | Qty: 30 | Fill #0

## 2019-06-07 MED FILL — XARELTO 20 MG TABLET: 20 | 30 days supply | Qty: 30 | Fill #0 | Status: TO

## 2019-07-22 ENCOUNTER — Ambulatory Visit: Payer: Self-pay | Attending: Internal Medicine | Admitting: Internal Medicine

## 2019-07-22 ENCOUNTER — Other Ambulatory Visit: Payer: Self-pay

## 2021-01-05 DIAGNOSIS — S02671A Fracture of alveolus of right mandible, initial encounter for closed fracture: Secondary | ICD-10-CM | POA: Insufficient documentation

## 2021-01-05 DIAGNOSIS — S0285XA Fracture of orbit, unspecified, initial encounter for closed fracture: Secondary | ICD-10-CM | POA: Insufficient documentation

## 2021-02-14 ENCOUNTER — Other Ambulatory Visit: Payer: Self-pay

## 2021-02-14 ENCOUNTER — Encounter: Payer: Self-pay | Admitting: Family Medicine

## 2021-02-14 ENCOUNTER — Ambulatory Visit: Payer: Medicaid Other | Attending: Family Medicine | Admitting: Family Medicine

## 2021-02-14 VITALS — BP 127/75 | HR 85 | Ht 73.0 in | Wt 221.6 lb

## 2021-02-14 DIAGNOSIS — M545 Low back pain, unspecified: Secondary | ICD-10-CM

## 2021-02-14 DIAGNOSIS — Z86711 Personal history of pulmonary embolism: Secondary | ICD-10-CM | POA: Diagnosis not present

## 2021-02-14 DIAGNOSIS — G8929 Other chronic pain: Secondary | ICD-10-CM | POA: Diagnosis not present

## 2021-02-14 DIAGNOSIS — I82409 Acute embolism and thrombosis of unspecified deep veins of unspecified lower extremity: Secondary | ICD-10-CM

## 2021-02-14 MED ORDER — RIVAROXABAN 20 MG PO TABS
20.0000 mg | ORAL_TABLET | Freq: Every day | ORAL | 2 refills | Status: DC
  Filled 2021-02-14: qty 30, 30d supply, fill #0
  Filled 2021-03-15: qty 30, 30d supply, fill #1

## 2021-02-14 MED ORDER — TIZANIDINE HCL 4 MG PO TABS
4.0000 mg | ORAL_TABLET | Freq: Three times a day (TID) | ORAL | 0 refills | Status: DC | PRN
  Filled 2021-02-14 – 2021-02-23 (×2): qty 60, 20d supply, fill #0

## 2021-02-14 NOTE — Progress Notes (Signed)
Wants to restart xarelto.
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
## 2021-02-14 NOTE — Patient Instructions (Signed)
Rivaroxaban Tablets What is this medication? RIVAROXABAN (ri va ROX a ban) prevents or treats blood clots. It is also used to lower the risk of stroke in people with AFib (atrial fibrillation). It can be used to lower the risk of heart attack or stroke in people with heart or peripheral artery disease. It belongs to a group of medications called bloodthinners. This medicine may be used for other purposes; ask your health care provider orpharmacist if you have questions. COMMON BRAND NAME(S): Xarelto, Xarelto Starter Pack What should I tell my care team before I take this medication? They need to know if you have any of these conditions: Antiphospholipid antibody syndrome Bleeding disorders Bleeding in the brain Blood clots Kidney disease Liver disease Prosthetic heart valve Recent or planned spinal or epidural procedure Stomach bleeding Take medications that treat or prevent blood clots An unusual or allergic reaction to rivaroxaban, other medications, foods, dyes, or preservatives Pregnant or trying to get pregnant Breast-feeding How should I use this medication? Take this medication by mouth. For your therapy to work as well as possible, take each dose exactly as prescribed on the prescription label. Do not skip doses. Skipping doses or stopping this medication can increase your risk of a blood clot or stroke. Keep taking this medication unless your care team tellsyou to stop. If you are taking this medication after hip or knee replacement surgery, take it with or without food. If you are taking this medication for atrial fibrillation, take it with your evening meal. If you are taking this medication to treat blood clots, take it with food at the same time each day. If you are taking this medication for coronary artery disease or peripheral artery disease, take it with or without food at the same time every day. If you are unable to swallow your tablet, you may crush the tablet and mix it in  applesauce. Then, immediately eat the applesauce. You should eat more foodright after you eat the applesauce containing the crushed tablet. A special MedGuide will be given to you by the pharmacist with eachprescription and refill. Be sure to read this information carefully each time. Talk to your care team about the use of this medication in children. While it may be prescribed for children as young as newborns for selected conditions,precautions do apply. Overdosage: If you think you have taken too much of this medicine contact apoison control center or emergency room at once. NOTE: This medicine is only for you. Do not share this medicine with others. What if I miss a dose? If you take your medication once a day and miss a dose, take it as soon as you can. If it is almost time for your next dose, take only that dose. Do not takedouble or extra doses. If you are taking this medication twice a day to treat blood clots and miss a dose, take the missed dose as soon as you remember. In this instance, 2 tablets may be taken at the same time. The next day you should take 1 tablet twice aday. If you are taking this medication twice a day for coronary artery disease or peripheral artery disease and miss a dose, skip it. Take your next dose at the normal time. Do not take extra or 2 doses at the same time to make up for themissed dose. What may interact with this medication? Do not take this medication with any of the following: Defibrotide This medication may also interact with the following: Aspirin and aspirin-like  medications Certain antibiotics like erythromycin and clarithromycin Certain medications for fungal infections like ketoconazole and itraconazole Certain medications for seizures like carbamazepine, phenytoin Certain medications that treat or prevent blood clots like warfarin, enoxaparin, dalteparin, apixaban, dabigatran, and edoxaban Conivaptan Indinavir Lopinavir; ritonavir NSAIDS,  medications for pain and inflammation, like ibuprofen or naproxen Rifampin Ritonavir SNRIs, medications for depression, like desvenlafaxine, duloxetine, levomilnacipran, venlafaxine SSRIs, medications for depression, like citalopram, escitalopram, fluoxetine, fluvoxamine, paroxetine, sertraline St. John's wort This list may not describe all possible interactions. Give your health care provider a list of all the medicines, herbs, non-prescription drugs, or dietary supplements you use. Also tell them if you smoke, drink alcohol, or use illegaldrugs. Some items may interact with your medicine. What should I watch for while using this medication? Visit your care team for regular checks on your progress. You may need blood work done while you are taking this medication. Your condition will be monitored carefully while you are receiving this medication. It is importantnot to miss any appointments. Avoid sports and activities that might cause injury while you are using this medication. Severe falls or injuries can cause unseen bleeding. Be careful when using sharp tools or knives. Consider using an Neurosurgeon. Take special care brushing or flossing your teeth. Report any injuries, bruising, or redspots on the skin to your care team. Wear a medical ID bracelet or chain. Carry a card that describes yourcondition. List the medications and doses you take on the card. Tell your dentist and dental surgeon that you are taking this medication. You should not have major dental surgery while on this medication. See your dentist to have a dental exam and fix any dental problems before starting this medication. Take good care of your teeth while on this medication. Make sureyou see your dentist for regular follow-up appointments. If you are going to need surgery or other procedure, tell your care team thatyou are using this medication. Do not become pregnant while taking this medication. Women should inform their care  team if they wish to become pregnant or think they might be pregnant. There is potential for serious harm to an unborn child. Talk to your care teamfor more information. What side effects may I notice from receiving this medication? Side effects that you should report to your care team as soon as possible: Allergic reactions-skin rash, itching, hives, swelling of the face, lips, tongue, or throat Bleeding-bloody or black, tar-like stools, vomiting blood or brown material that looks like coffee grounds, red or dark brown urine, small red or purple spots on skin, unusual bruising or bleeding Bleeding in the brain-severe headache, stiff neck, confusion, dizziness, change in vision, numbness or weakness of the face, arm, or leg, trouble speaking, trouble walking, vomiting Heavy periods This list may not describe all possible side effects. Call your doctor for medical advice about side effects. You may report side effects to FDA at1-800-FDA-1088. Where should I keep my medication? Keep out of the reach of children and pets. Store at room temperature between 20 and 25 degrees C (68 and 77 degrees F).Get rid of any unused medication after the expiration date. To get rid of medications that are no longer needed or have expired: Take the medication to a medication take-back program. Check your pharmacy or law enforcement to find a location. If you cannot return the medication, check the label or package insert to see if the medication should be thrown out in the garbage or flushed down the toilet. If you are not  sure, ask your care team. If it is safe to put it in the trash, empty the medication out of the container. Mix the medication with cat litter, dirt, coffee grounds, or other unwanted substance. Seal the mixture in a bag or container. Put it in the trash. NOTE: This sheet is a summary. It may not cover all possible information. If you have questions about this medicine, talk to your doctor, pharmacist,  orhealth care provider.  2022 Elsevier/Gold Standard (2020-06-21 11:02:59)

## 2021-02-15 ENCOUNTER — Other Ambulatory Visit: Payer: Self-pay

## 2021-02-22 ENCOUNTER — Other Ambulatory Visit: Payer: Self-pay

## 2021-02-23 ENCOUNTER — Other Ambulatory Visit: Payer: Self-pay

## 2021-03-15 ENCOUNTER — Other Ambulatory Visit: Payer: Self-pay

## 2021-03-22 ENCOUNTER — Other Ambulatory Visit: Payer: Self-pay

## 2021-03-22 ENCOUNTER — Encounter: Payer: Self-pay | Admitting: Physician Assistant

## 2021-03-22 ENCOUNTER — Ambulatory Visit: Payer: Medicaid Other | Attending: Physician Assistant | Admitting: Physician Assistant

## 2021-03-22 VITALS — BP 142/88 | HR 77 | Ht 73.0 in | Wt 229.4 lb

## 2021-03-22 DIAGNOSIS — Z89421 Acquired absence of other right toe(s): Secondary | ICD-10-CM | POA: Diagnosis not present

## 2021-03-22 DIAGNOSIS — G8929 Other chronic pain: Secondary | ICD-10-CM | POA: Insufficient documentation

## 2021-03-22 DIAGNOSIS — S98131A Complete traumatic amputation of one right lesser toe, initial encounter: Secondary | ICD-10-CM | POA: Diagnosis not present

## 2021-03-22 DIAGNOSIS — M79604 Pain in right leg: Secondary | ICD-10-CM | POA: Insufficient documentation

## 2021-03-22 DIAGNOSIS — Z7901 Long term (current) use of anticoagulants: Secondary | ICD-10-CM | POA: Insufficient documentation

## 2021-03-22 DIAGNOSIS — M545 Low back pain, unspecified: Secondary | ICD-10-CM

## 2021-03-22 DIAGNOSIS — M79671 Pain in right foot: Secondary | ICD-10-CM | POA: Diagnosis not present

## 2021-03-22 MED ORDER — CYCLOBENZAPRINE HCL 10 MG PO TABS: 10.0000 mg | ORAL_TABLET | Freq: Three times a day (TID) | ORAL | 1 refills | Status: DC | PRN

## 2021-03-22 NOTE — Progress Notes (Signed)
Keith Potter, is a 55 y.o. male  YKD:[id number]  LZJ:[id number]  DOB - 08/29/1965  Chief Complaint  Patient presents with   Back Pain   Foot Injury       Subjective:   Keith Potter is a 55 y.o. male here today for a follow up visit for chronic LBP and R foot pain.  He had hydraulic equipment fall on his foot and hurt his back about 15-20 years ago and had to have his great toe and 2nd toe amputated.  He is having pain on his 3rd toe where he seems to compensate and walk with a lot of pressure on his 3rd toe.  He is also wanting to see what longer term solutions might be available for him for his back.  He does have pain that radiates into his R leg.  Zanaflex not helping.  He is taking his xarelto consistently now.  No problems updated.  ALLERGIES: No Known Allergies  PAST MEDICAL HISTORY: Past Medical History:  Diagnosis Date   DVT (deep venous thrombosis) (HCC)    Pulmonary emboli (HCC)     MEDICATIONS AT HOME: Prior to Admission medications   Medication Sig Start Date End Date Taking? Authorizing Provider  cyclobenzaprine (FLEXERIL) 10 MG tablet Take 1 tablet (10 mg total) by mouth 3 (three) times daily as needed for muscle spasms. 03/22/21  Yes Anders Simmonds, PA-C  rivaroxaban (XARELTO) 20 MG TABS tablet Take 1 tablet (20 mg total) by mouth daily with supper. 02/14/21  Yes Hoy Register, MD  Rivaroxaban (XARELTO) 15 MG TABS tablet Take 1 tablet (15 mg total) by mouth 2 (two) times daily with a meal for 21 days, THEN 1 tablet (15 mg total) daily for 7 days. 03/27/19 04/24/19  Christian, Rylee, MD    ROS: Neg HEENT Neg resp Neg cardiac Neg GI Neg GU Neg psych Neg neuro  Objective:   Vitals:   03/22/21 1059  BP: (!) 142/88  Pulse: 77  SpO2: 97%  Weight: 229 lb 6 oz (104 kg)  Height: 6\' 1"  (1.854 m)   Exam General appearance : Awake, alert, not in any distress. Speech Clear. Not toxic looking HEENT: Atraumatic and Normocephalic, pupils equally reactive  to light and accomodation Neck: Supple, no JVD. No cervical lymphadenopathy.  Chest: Good air entry bilaterally, CTAB.  No rales/rhonchi/wheezing CVS: S1 S2 regular, no murmurs.  R foot-s/p amp great and second toe.  3rd toe with mild swelling and no erythema/infection Extremities: B/L Lower Ext shows no edema, both legs are warm to touch Neurology: Awake alert, and oriented X 3, CN II-XII intact, Non focal Skin: No Rash  Data Review Lab Results  Component Value Date   HGBA1C 5.7 (H) 12/28/2018    Assessment & Plan   1. Chronic midline low back pain without sciatica No back imaging on file - Ambulatory referral to Orthopedic Surgery - cyclobenzaprine (FLEXERIL) 10 MG tablet; Take 1 tablet (10 mg total) by mouth 3 (three) times daily as needed for muscle spasms.  Dispense: 30 tablet; Refill: 1  2. Amputated toe of right foot [facility]) May benefit from orthotic or device for shoe to spread weight evenly on his foot - Ambulatory referral to Podiatry    Patient have been counseled extensively about nutrition and exercise. Other issues discussed during this visit include: low cholesterol diet, weight control and daily exercise, foot care, annual eye examinations at Ophthalmology, importance of adherence with medications and regular follow-up. We also discussed long term complications  of uncontrolled diabetes and hypertension.   Return in about 3 months (around 06/21/2021) for dr Laural Benes for chronic conditions.  The patient was given clear instructions to go to ER or return to medical center if symptoms don't improve, worsen or new problems develop. The patient verbalized understanding. The patient was told to call to get lab results if they haven't heard anything in the next week.      Georgian Co, PA-C [facility] [place], [place] [phone]   03/22/2021, 1:15 PM Patient ID: Keith Potter, male   DOB: 11/17/1965, 55 y.o.   MRN: [id number]

## 2021-03-22 NOTE — Progress Notes (Signed)
Right foot pain

## 2021-04-04 ENCOUNTER — Encounter: Payer: Self-pay | Admitting: Surgery

## 2021-04-04 ENCOUNTER — Other Ambulatory Visit: Payer: Self-pay

## 2021-04-04 ENCOUNTER — Ambulatory Visit (INDEPENDENT_AMBULATORY_CARE_PROVIDER_SITE_OTHER): Payer: Medicaid Other | Admitting: Surgery

## 2021-04-04 ENCOUNTER — Ambulatory Visit: Payer: Self-pay

## 2021-04-04 VITALS — BP 133/85 | HR 78 | Ht 73.0 in | Wt 227.2 lb

## 2021-04-04 DIAGNOSIS — M545 Low back pain, unspecified: Secondary | ICD-10-CM

## 2021-04-04 DIAGNOSIS — M5416 Radiculopathy, lumbar region: Secondary | ICD-10-CM

## 2021-04-04 MED ORDER — METHYLPREDNISOLONE 4 MG PO TABS: ORAL_TABLET | ORAL | 0 refills | Status: DC

## 2021-04-04 NOTE — Progress Notes (Signed)
Office Visit Note   Patient: Keith Potter           Date of Birth: 12-29-1965           MRN: [id number] Visit Date: 04/04/2021              Requested by: Anders Simmonds, PA-C [address] [place],  [place] [address] PCP: Marcine Matar, MD   Assessment & Plan: Visit Diagnoses:  1. Low back pain, unspecified back pain laterality, unspecified chronicity, unspecified whether sciatica present   2. Radiculopathy, lumbar region     Plan: With patient's worsening pain, and duration of symptoms and failed conservative treatment I recommend getting lumbar MRI to rule out HNP/stenosis.  Follow-up with Dr. Otelia Sergeant in 3 weeks for recheck and to review the study and discuss treatment options.  I also sent in a Medrol Dosepak 6-day taper to be taken as directed to see if this will help with his pain.  Follow-Up Instructions: Return in about 3 weeks (around 04/25/2021) for with dr Otelia Sergeant to review lumbar mri scan.   Orders:  Orders Placed This Encounter  Procedures   XR Lumbar Spine 2-3 Views   MR Lumbar Spine w/o contrast   Meds ordered this encounter  Medications   methylPREDNISolone (MEDROL) 4 MG tablet    Sig: 6 day taper to be taken as directed.    Dispense:  21 tablet    Refill:  0      Procedures: No procedures performed   Clinical Data: No additional findings.   Subjective: Chief Complaint  Patient presents with   Lower Back - New Patient (Initial Visit)    HPI 55 year old male who is new patient to clinic comes in with complaints of worsening low back pain and right lower extremity radiculopathy.  Patient states that he had this problem off and on for about 7-8 years.  He was seen recently by his PCP and prescribed Flexeril.  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  Pain increased with activity.  Describes this as being a 10 out of 10 at times.  I reviewed patient's chart and he was also seen at [facility] 2016 and 2017 for back pain.  No  complaints of bowel or bladder incontinence. Review of Systems No current cardiac pulmonary GI GU  Objective: Vital Signs: BP 133/85 (BP Location: Left Arm, Patient Position: Sitting, Cuff Size: Normal)   Pulse 78   Ht 6\' 1"  (1.854 m)   Wt 227 lb 3.2 oz (103.1 kg)   BMI 29.98 kg/m   Physical Exam HENT:     Head: Normocephalic.  Eyes:     Extraocular Movements: Extraocular movements intact.  Pulmonary:     Effort: No respiratory distress.  Musculoskeletal:     Comments: Gait is somewhat antalgic.  Lumbar paraspinal tenderness.  Negative log about his.  Positive right straight leg raise.  No focal motor deficits.  Neurological:     General: No focal deficit present.     Mental Status: He is alert and oriented to person, place, and time.  Psychiatric:        Mood and Affect: Mood normal.    Ortho Exam  Specialty Comments:  No specialty comments available.  Imaging: No results found.   PMFS History: Patient Active Problem List   Diagnosis Date Noted   Tobacco dependence 04/23/2019   Marijuana user 04/23/2019   Rectal bleed 04/23/2019   History of pulmonary embolism  04/23/2019   DVT (deep venous thrombosis) (HCC) 03/31/2019   Pulmonary emboli (HCC) 03/27/2019   Hyperlipidemia 01/10/2019   Healthcare maintenance 01/10/2019   Pulmonary embolism (HCC) 12/28/2018   History of DVT (deep vein thrombosis) 12/28/2018   Past Medical History:  Diagnosis Date   DVT (deep venous thrombosis) (HCC)    Pulmonary emboli (HCC)     History reviewed. No pertinent family history.  Past Surgical History:  Procedure Laterality Date   AMPUTATION TOE     APPENDECTOMY     HERNIA REPAIR     Social History   Occupational History   Not on file  Tobacco Use   Smoking status: Former    Packs/day: 0.50    Types: Cigarettes   Smokeless tobacco: Never  Vaping Use   Vaping Use: Never used  Substance and Sexual Activity   Alcohol use: Not Currently   Drug use: Yes    Types:  Marijuana   Sexual activity: Yes    Partners: Female    Birth control/protection: Condom

## 2021-04-11 ENCOUNTER — Ambulatory Visit: Payer: Medicaid Other | Admitting: Podiatry

## 2021-04-16 ENCOUNTER — Other Ambulatory Visit: Payer: Self-pay | Admitting: Surgery

## 2021-04-17 ENCOUNTER — Other Ambulatory Visit: Payer: Self-pay

## 2021-04-18 ENCOUNTER — Ambulatory Visit (INDEPENDENT_AMBULATORY_CARE_PROVIDER_SITE_OTHER): Payer: Medicaid Other | Admitting: Podiatry

## 2021-04-18 ENCOUNTER — Ambulatory Visit (INDEPENDENT_AMBULATORY_CARE_PROVIDER_SITE_OTHER): Payer: Medicaid Other

## 2021-04-18 ENCOUNTER — Other Ambulatory Visit: Payer: Self-pay

## 2021-04-18 DIAGNOSIS — M2041 Other hammer toe(s) (acquired), right foot: Secondary | ICD-10-CM | POA: Diagnosis not present

## 2021-04-18 NOTE — Progress Notes (Signed)
   HPI: 55 y.o. male presenting today as a new patient for evaluation of a symptomatic toe to the right third digit.  Patient has a history of amputation of the first and second digits about 20 years ago secondary to a work-related injury when his toes were crushed.  He states that his third toe was also crushed however it was salvaged.  He has had pain and tenderness for the last several years.  He has been unable to address the pain in his toe because he did not have insurance for a period of time.  Patient is on chronic anticoagulant secondary to history of DVT he presents for further treatment evaluation  Past Medical History:  Diagnosis Date   DVT (deep venous thrombosis) (HCC)    Pulmonary emboli [facility])      Physical Exam: General: The patient is alert and oriented x3 in no acute distress.  Dermatology: Skin is warm, dry and supple bilateral lower extremities. Negative for open lesions or macerations.  Vascular: Palpable pedal pulses bilaterally. No edema or erythema noted. Capillary refill within normal limits.  Neurological: Epicritic and protective threshold grossly intact bilaterally.   Musculoskeletal Exam: Absence of the first and second toe noted.  Severe hammertoe contracture deformity noted to the third digit of the right foot.  Associated chronic pain and severe pain with palpation of the toe.  Radiographic Exam:  Absence of the first and second digits.  The bases of the proximal phalanges are intact.  Severe hammertoe contracture noted especially to the third digit of the foot  Assessment: 1.  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  Chronic pain in toe right third digit  Plan of Care:  1. Patient evaluated. X-Rays reviewed.  2. Today we discussed the conservative versus surgical management of the presenting pathology. The patient opts for surgical management. All possible complications and  details of the procedure were explained. All patient questions were answered. No guarantees were expressed or implied. 3. Authorization for surgery was initiated today. Surgery will consist of partial toe amputation right third digit.  I believe I can just disarticulate the DIPJ and remove the distal phalanx and toenail and this should help alleviate a lot of the patient's symptoms 4.  Patient is on chronic anticoagulant medication for DVTs.  He will need clearance from his PCP who prescribes the anticoagulant 5.  Return to clinic 1 week postop       Felecia Shelling, DPM [facility]  Dr. Felecia Shelling, DPM    [address] [place], [place] [address]                Office [phone]  Fax [phone]

## 2021-04-19 ENCOUNTER — Encounter: Payer: Self-pay | Admitting: Podiatry

## 2021-04-25 ENCOUNTER — Encounter: Payer: Self-pay | Admitting: Specialist

## 2021-04-25 ENCOUNTER — Other Ambulatory Visit: Payer: Self-pay

## 2021-04-25 ENCOUNTER — Ambulatory Visit (INDEPENDENT_AMBULATORY_CARE_PROVIDER_SITE_OTHER): Payer: Medicaid Other | Admitting: Specialist

## 2021-04-25 VITALS — BP 135/90 | HR 67 | Ht 73.0 in | Wt 227.0 lb

## 2021-04-25 DIAGNOSIS — M5416 Radiculopathy, lumbar region: Secondary | ICD-10-CM

## 2021-04-25 DIAGNOSIS — M545 Low back pain, unspecified: Secondary | ICD-10-CM | POA: Diagnosis not present

## 2021-04-25 DIAGNOSIS — M5136 Other intervertebral disc degeneration, lumbar region: Secondary | ICD-10-CM | POA: Diagnosis not present

## 2021-04-25 MED ORDER — TRAMADOL HCL 50 MG PO TABS: 100.0000 mg | ORAL_TABLET | Freq: Four times a day (QID) | ORAL | 0 refills | Status: DC | PRN

## 2021-04-25 MED ORDER — PREGABALIN 75 MG PO CAPS: 75.0000 mg | ORAL_CAPSULE | Freq: Two times a day (BID) | ORAL | 0 refills | Status: DC

## 2021-04-25 NOTE — Patient Instructions (Signed)
Avoid frequent bending and stooping  No lifting greater than 10 lbs. May use ice or moist heat for pain. Weight loss is of benefit. Best medication for lumbar disc disease is arthritis medications like motrin, celebrex and naprosyn. Exercise is important to improve your indurance and does allow people to function better inspite of back pain. Tramadol for pain  Lyrica for nerve pain Physical therapy and if no improvement we will need to perform imaging studies to assess for cause of  Sciatica.

## 2021-04-25 NOTE — Progress Notes (Signed)
Office Visit Note   Patient: Keith Potter           Date of Birth: 08-Sep-1965           MRN: [id number] Visit Date: 04/25/2021              Requested by: Marcine Matar, MD [address] [place],  [place] [address] PCP: Marcine Matar, MD   Assessment & Plan: Visit Diagnoses:  1. Radiculopathy, lumbar region   2. Low back pain, unspecified back pain laterality, unspecified chronicity, unspecified whether sciatica present   3. Degenerative disc disease, lumbar     Plan: Avoid frequent bending and stooping  No lifting greater than 10 lbs. May use ice or moist heat for pain. Weight loss is of benefit. Best medication for lumbar disc disease is arthritis medications like motrin, celebrex and naprosyn. Exercise is important to improve your indurance and does allow people to function better inspite of back pain. Tramadol for pain  Lyrica for nerve pain Physical therapy and if no improvement we will need to perform imaging studies to assess for cause of  Sciatica.  Follow-Up Instructions: Return in about 3 weeks (around 05/16/2021).   Orders:  Orders Placed This Encounter  Procedures   Ambulatory referral to Physical Therapy   Meds ordered this encounter  Medications   traMADol (ULTRAM) 50 MG tablet    Sig: Take 2 tablets (100 mg total) by mouth every 6 (six) hours as needed.    Dispense:  30 tablet    Refill:  0   pregabalin (LYRICA) 75 MG capsule    Sig: Take 1 capsule (75 mg total) by mouth 2 (two) times daily.    Dispense:  60 capsule    Refill:  0      Procedures: No procedures performed   Clinical Data: No additional findings.   Subjective: Chief Complaint  Patient presents with   Lower Back - Follow-up    MRI was denied---Denial Rationale  On 04/16/21, we asked your provider for the following important facts or documents: doctor's notes that say you did six weeks of back exercises (physical therapy, chiropractic treatments, or medically  directed home exercise program) in the last six months. We also need to know the number of visits you went to. If you did this, the notes do not show the dates that you did the exercises. Without this additional information, your request did not meet criteria for appro    55 year old male with history of back pain and right sided sciatica. This pain has been present for years and he has been seen by the ER as long ago as 10 years ago and also had been seen by the [facility].  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 Saw Zonia Kief PA_C about 2 weeks ago and MRI was ordered but his insurance has denied The order due to lack of acute therapy. He has completed a medrol dose pak.   Review of Systems  Constitutional: Negative.   HENT: Negative.    Eyes: Negative.   Respiratory: Negative.    Cardiovascular: Negative.   Gastrointestinal: Negative.   Endocrine: Negative.   Genitourinary: Negative.   Musculoskeletal: Negative.   Skin: Negative.   Allergic/Immunologic: Negative.   Neurological: Negative.   Hematological: Negative.   Psychiatric/Behavioral:  Negative.      Objective: Vital Signs: BP 135/90 (BP Location: Left Arm, Patient Position: Sitting)   Pulse 67   Ht 6\' 1"  (1.854 m)   Wt 227 lb (103 kg)   BMI 29.95 kg/m   Physical Exam Constitutional:      Appearance: He is well-developed.  HENT:     Head: Normocephalic and atraumatic.  Eyes:     Pupils: Pupils are equal, round, and reactive to light.  Pulmonary:     Effort: Pulmonary effort is normal.     Breath sounds: Normal breath sounds.  Abdominal:     General: Bowel sounds are normal.     Palpations: Abdomen is soft.  Musculoskeletal:     Cervical back: Normal range of motion and neck supple.     Lumbar back: Positive right straight leg  raise test.  Skin:    General: Skin is warm and dry.  Neurological:     Mental Status: He is alert and oriented to person, place, and time.  Psychiatric:        Behavior: Behavior normal.        Thought Content: Thought content normal.        Judgment: Judgment normal.   Back Exam   Tenderness  The patient is experiencing tenderness in the lumbar.  Range of Motion  Extension:  abnormal  Flexion:  abnormal  Lateral bend right:  abnormal  Lateral bend left:  abnormal  Rotation right:  abnormal  Rotation left:  abnormal   Muscle Strength  Left Quadriceps:  5/5  Right Hamstrings:  5/5  Left Hamstrings:  5/5   Tests  Straight leg raise right: positive  Reflexes  Patellar:  2/4 Achilles:  0/4  Other  Toe walk: abnormal Heel walk: abnormal Sensation: normal Erythema: no back redness Scars: absent  Comments:  SLR positive at 40 degrees right, popliteal compression sign is positive right side.  Shot of steroid give last time, radiographs with DDD L4-5 and L5-S1 with anterior lip osteophytes, no listhesis.     Specialty Comments:  No specialty comments available.  Imaging: No results found.   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 No pertinent family history.  Past Surgical History:  Procedure Laterality Date   AMPUTATION TOE     APPENDECTOMY     HERNIA REPAIR     Social History   Occupational History   Not on file  Tobacco Use   Smoking status: Former    Packs/day: 0.50    Types: Cigarettes   Smokeless tobacco: Never  Vaping Use   Vaping Use: Never used  Substance and Sexual Activity   Alcohol use: Not Currently   Drug use: Yes    Types: Marijuana   Sexual activity: Yes    Partners: Female    Birth control/protection: Condom

## 2021-04-27 ENCOUNTER — Other Ambulatory Visit: Payer: Medicaid Other

## 2021-05-04 ENCOUNTER — Other Ambulatory Visit: Payer: Self-pay

## 2021-05-04 ENCOUNTER — Ambulatory Visit: Payer: Medicaid Other | Attending: Internal Medicine | Admitting: Internal Medicine

## 2021-05-04 VITALS — BP 135/88 | HR 79 | Ht 73.0 in | Wt 227.0 lb

## 2021-05-04 DIAGNOSIS — F1290 Cannabis use, unspecified, uncomplicated: Secondary | ICD-10-CM

## 2021-05-04 DIAGNOSIS — Z01810 Encounter for preprocedural cardiovascular examination: Secondary | ICD-10-CM | POA: Insufficient documentation

## 2021-05-04 DIAGNOSIS — Z7901 Long term (current) use of anticoagulants: Secondary | ICD-10-CM | POA: Diagnosis not present

## 2021-05-04 DIAGNOSIS — R351 Nocturia: Secondary | ICD-10-CM | POA: Insufficient documentation

## 2021-05-04 DIAGNOSIS — Z125 Encounter for screening for malignant neoplasm of prostate: Secondary | ICD-10-CM

## 2021-05-04 DIAGNOSIS — M545 Low back pain, unspecified: Secondary | ICD-10-CM | POA: Diagnosis not present

## 2021-05-04 DIAGNOSIS — Z87891 Personal history of nicotine dependence: Secondary | ICD-10-CM | POA: Insufficient documentation

## 2021-05-04 DIAGNOSIS — Z89421 Acquired absence of other right toe(s): Secondary | ICD-10-CM | POA: Insufficient documentation

## 2021-05-04 DIAGNOSIS — I10 Essential (primary) hypertension: Secondary | ICD-10-CM | POA: Insufficient documentation

## 2021-05-04 DIAGNOSIS — Z86718 Personal history of other venous thrombosis and embolism: Secondary | ICD-10-CM | POA: Diagnosis not present

## 2021-05-04 DIAGNOSIS — Z23 Encounter for immunization: Secondary | ICD-10-CM

## 2021-05-04 DIAGNOSIS — G8929 Other chronic pain: Secondary | ICD-10-CM | POA: Insufficient documentation

## 2021-05-04 DIAGNOSIS — R079 Chest pain, unspecified: Secondary | ICD-10-CM | POA: Diagnosis not present

## 2021-05-04 DIAGNOSIS — Z86711 Personal history of pulmonary embolism: Secondary | ICD-10-CM | POA: Insufficient documentation

## 2021-05-04 DIAGNOSIS — Z01818 Encounter for other preprocedural examination: Secondary | ICD-10-CM

## 2021-05-04 DIAGNOSIS — R739 Hyperglycemia, unspecified: Secondary | ICD-10-CM

## 2021-05-04 MED ORDER — TAMSULOSIN HCL 0.4 MG PO CAPS
0.4000 mg | ORAL_CAPSULE | Freq: Every day | ORAL | 3 refills | Status: DC
  Filled 2021-05-04 – 2021-05-07 (×2): qty 30, 30d supply, fill #0
  Filled 2021-06-12: qty 30, 30d supply, fill #1

## 2021-05-04 MED ORDER — AMLODIPINE BESYLATE 5 MG PO TABS
5.0000 mg | ORAL_TABLET | Freq: Every day | ORAL | 3 refills | Status: DC
  Filled 2021-05-04 – 2021-05-07 (×2): qty 30, 30d supply, fill #0
  Filled 2021-06-12: qty 30, 30d supply, fill #1
  Filled 2021-10-19: qty 30, 30d supply, fill #0
  Filled 2022-01-15 – 2022-02-18 (×2): qty 30, 30d supply, fill #1

## 2021-05-04 MED ORDER — AMLODIPINE BESYLATE 5 MG PO TABS: 5.0000 mg | ORAL_TABLET | Freq: Every day | ORAL | 3 refills | Status: DC

## 2021-05-04 MED ORDER — TAMSULOSIN HCL 0.4 MG PO CAPS
0.4000 mg | ORAL_CAPSULE | Freq: Every day | ORAL | 3 refills | Status: DC
Start: 2021-05-04 — End: 2021-05-04

## 2021-05-04 NOTE — Patient Instructions (Addendum)
Stop Xarelto 3 days prior to your surgery.  You can resume Xarelto after the surgery once the surgeon tells you it is safe to do so.  Your blood pressure is elevated.  Try to limit salt in the foods.  We have started you on a blood pressure medication called amlodipine.

## 2021-05-04 NOTE — Progress Notes (Addendum)
Patient ID: Keith Potter, male    DOB: [birth year]  MRN: [id number]  CC: Pre-op Exam   Subjective: Keith Potter is a 55 y.o. 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  He will be having surgery on his right third toe due to hammertoe contracture deformity.  Surgery will consist of partial toe amputation.  Surgery will be done under general anesthesia. Previous surgeries on jaw and RT foot.  Most recent surgery was the jaw surgery 3-4 mths ago due to broke jaw. No problems with anesthesia in past.   Not very active but walks 1 block to store from his house.  He reports intermittent chest pains on the left side of the chest that sometimes goes into the left arm with exertion.  This is associated with shortness of breath.  He quit smoking 3 months ago.  Uses marijuana daily.  Denies use of any other street drugs.  Reports compliance with taking Xarelto since he was restarted on it 01/2021.  Last DVT was about 2 years ago.   -Blood pressure noted to be elevated today and looks like it has been on recent visits within the health system.  Complains of frequent urination at nights.  He has to wake up about 3-4 times to urinate.  He has to strain to get his flow started and flow once started is stop and go.  Endorses postvoid dribbling.  He does not feel like he completely empties his bladder post urination.   Patient Active Problem List   Diagnosis Date Noted   Closed fracture of right orbit (HCC) 01/05/2021   Fracture of alveolus of right mandible, initial encounter for closed fracture (HCC) 01/05/2021   Tobacco dependence 04/23/2019   Marijuana user 04/23/2019   Rectal bleed 04/23/2019   History of pulmonary embolism 04/23/2019   DVT (deep venous  thrombosis) (HCC) 03/31/2019   Pulmonary emboli (HCC) 03/27/2019   Hyperlipidemia 01/10/2019   Healthcare maintenance 01/10/2019   Pulmonary embolism (HCC) 12/28/2018   History of DVT (deep vein thrombosis) 12/28/2018   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   Chronic pain of toe, right 06/06/2016   Deformity of toe, right 06/06/2016   Increased frequency of urination 06/09/2013   Nocturia 06/09/2013     Current Outpatient Medications on File Prior to Visit  Medication Sig Dispense Refill   cyclobenzaprine (FLEXERIL) 10 MG tablet Take 1 tablet (10 mg total) by mouth 3 (three) times daily as needed for muscle spasms. 30 tablet 1   methylPREDNISolone (MEDROL) 4 MG tablet 6 day taper to be taken as directed. 21 tablet 0   pregabalin (LYRICA) 75 MG capsule Take 1 capsule (75 mg total) by mouth 2 (two) times daily. 60 capsule 0   rivaroxaban (XARELTO) 20 MG TABS tablet Take 1 tablet (20 mg total) by mouth daily with supper. 30 tablet 2   traMADol (ULTRAM) 50 MG tablet Take 2 tablets (100 mg total) by mouth every 6 (six) hours as needed. 30 tablet 0   No current facility-administered medications on file prior to visit.    No Known Allergies  Social History   Socioeconomic History   Marital status: Single    Spouse name: Not on file   Number of children: Not on file  Years of education: Not on file   Highest education level: Not on file  Occupational History   Not on file  Tobacco Use   Smoking status: Former    Packs/day: 0.50    Types: Cigarettes   Smokeless tobacco: Never  Vaping Use   Vaping Use: Never used  Substance and Sexual Activity   Alcohol use: Not Currently   Drug use: Yes    Types: Marijuana   Sexual activity: Yes    Partners: Female    Birth control/protection: Condom  Other Topics Concern   Not on file  Social History Narrative   Not on file   Social Determinants of Health   Financial Resource Strain: Not on file  Food Insecurity: Not on file   Transportation Needs: Not on file  Physical Activity: Not on file  Stress: Not on file  Social Connections: Not on file  Intimate Partner Violence: Not on file    No family history on file.  Past Surgical History:  Procedure Laterality Date   AMPUTATION TOE     APPENDECTOMY     HERNIA REPAIR      ROS: Review of Systems Negative except as stated above  PHYSICAL EXAM: BP 135/88 (BP Location: Left Arm, Patient Position: Sitting, Cuff Size: Large)   Pulse 79   Ht 6\' 1"  (1.854 m)   Wt 227 lb (103 kg)   SpO2 99%   BMI 29.95 kg/m   Physical Exam   General appearance - alert, well appearing, older African-American male and in no distress Mental status - normal mood, behavior, speech, dress, motor activity, and thought processes Eyes - pupils equal and reactive, extraocular eye movements intact Mouth - mucous membranes moist, pharynx normal without lesions Neck - supple, no significant adenopathy Chest - clear to auscultation, no wheezes, rales or rhonchi, symmetric air entry Heart - normal rate, regular rhythm, normal S1, S2, no murmurs, rubs, clicks or gallops Extremities - peripheral pulses normal, no pedal edema, no clubbing or cyanosis  EKG reveals sinus rhythm with rate of 75, poor R wave progression with question of possible anterior infarct. CMP Latest Ref Rng & Units 04/01/2019 03/31/2019 03/27/2019  Glucose 70 - 99 mg/dL 05/27/2019) 75 364(W)  BUN 6 - 20 mg/dL 14 10 15   Creatinine 0.61 - 1.24 mg/dL 803(O) 1.22(Q)  Sodium 135 - 145 mmol/L 142 142 138  Potassium 3.5 - 5.1 mmol/L 3.7 4.0 3.6  Chloride 98 - 111 mmol/L 108 106 110  CO2 22 - 32 mmol/L 24 24 21(L)  Calcium 8.9 - 10.3 mg/dL 9.1 9.6 8.25)  Total Protein 6.5 - 8.1 g/dL 6.4(L) - -  Total Bilirubin 0.3 - 1.2 mg/dL 0.8 - -  Alkaline Phos 38 - 126 U/L 88 - -  AST 15 - 41 U/L 18 - -  ALT 0 - 44 U/L 18 - -   Lipid Panel     Component Value Date/Time   CHOL 214 (H) 12/28/2018 2042   TRIG 75 12/28/2018  2042   HDL 39 (L) 12/28/2018 2042   CHOLHDL 5.5 12/28/2018 2042   VLDL 15 12/28/2018 2042   LDLCALC 160 (H) 12/28/2018 2042    CBC    Component Value Date/Time   WBC 9.2 04/01/2019 0427   RBC 4.75 04/01/2019 0427   HGB 14.9 04/01/2019 0427   HCT 44.5 04/01/2019 0427   HCT 42.8 12/29/2018 0234   PLT 173 04/01/2019 0427   MCV 93.7 04/01/2019 0427   MCH 31.4 04/01/2019  0427   MCHC 33.5 04/01/2019 0427   RDW 12.1 04/01/2019 0427   LYMPHSABS 3.0 03/31/2019 1720   MONOABS 0.7 03/31/2019 1720   EOSABS 0.4 03/31/2019 1720   BASOSABS 0.1 03/31/2019 1720    ASSESSMENT AND PLAN:  1. Preoperative evaluation to rule out surgical contraindication Surgical procedure that he will be having his low risk.  However patient gives concerning history for possible CAD.  I will refer him to cardiology prior to his surgery. Advised patient to hold Xarelto at least 3 days prior to his surgery. - EKG 12-Lead - Ambulatory referral to Cardiology  2. Essential hypertension DASH diet discussed and encouraged.  Patient started on amlodipine.  Follow-up with clinical pharmacist in 2 weeks for repeat blood pressure check. - CBC - Comprehensive metabolic panel - Lipid panel - amLODipine (NORVASC) 5 MG tablet; Take 1 tablet (5 mg total) by mouth daily.  Dispense: 30 tablet; Refill: 3  3. Chest pain in adult See #1 above. - Ambulatory referral to Cardiology  4. Nocturia Sounds like he may have BPH.  Try him on low-dose Flomax. - PSA - tamsulosin (FLOMAX) 0.4 MG CAPS capsule; Take 1 capsule (0.4 mg total) by mouth daily.  Dispense: 30 capsule; Refill: 3  5. Marijuana user Advised to quit at least several weeks prior to his surgery.  6. Former smoker Ended him on quitting.  Encouraged him to remain tobacco free.  7. Need for immunization against influenza - Flu Vaccine QUAD 68mo+IM (Fluarix, Fluzone & Alfiuria Quad PF)  8. Prostate cancer screening - PSA  Addendum:  pt with elev BS.  A1C  added. Patient was given the opportunity to ask questions.  Patient verbalized understanding of the plan and was able to repeat key elements of the plan.   Orders Placed This Encounter  Procedures   Flu Vaccine QUAD 83mo+IM (Fluarix, Fluzone & Alfiuria Quad PF)   CBC   Comprehensive metabolic panel   Lipid panel   PSA   Ambulatory referral to Cardiology   EKG 12-Lead     Requested Prescriptions   Signed Prescriptions Disp Refills   tamsulosin (FLOMAX) 0.4 MG CAPS capsule 30 capsule 3    Sig: Take 1 capsule (0.4 mg total) by mouth daily.   amLODipine (NORVASC) 5 MG tablet 30 tablet 3    Sig: Take 1 tablet (5 mg total) by mouth daily.    Return in about 3 months (around 08/04/2021) for give appt with [facility] in 2 wks for repeat BP check.  Jonah Blue, MD, FACP

## 2021-05-05 DIAGNOSIS — Z87891 Personal history of nicotine dependence: Secondary | ICD-10-CM | POA: Insufficient documentation

## 2021-05-05 DIAGNOSIS — I10 Essential (primary) hypertension: Secondary | ICD-10-CM | POA: Insufficient documentation

## 2021-05-07 ENCOUNTER — Ambulatory Visit: Payer: Medicaid Other | Attending: Internal Medicine

## 2021-05-07 ENCOUNTER — Other Ambulatory Visit: Payer: Self-pay

## 2021-05-08 ENCOUNTER — Other Ambulatory Visit: Payer: Self-pay

## 2021-05-08 ENCOUNTER — Telehealth: Payer: Self-pay | Admitting: Internal Medicine

## 2021-05-08 ENCOUNTER — Other Ambulatory Visit: Payer: Self-pay | Admitting: Internal Medicine

## 2021-05-08 DIAGNOSIS — E785 Hyperlipidemia, unspecified: Secondary | ICD-10-CM

## 2021-05-08 DIAGNOSIS — R972 Elevated prostate specific antigen [PSA]: Secondary | ICD-10-CM

## 2021-05-08 LAB — CBC
Hematocrit: 49.6 % (ref 37.5–51.0)
Hemoglobin: 17 g/dL (ref 13.0–17.7)
MCH: 31.4 pg (ref 26.6–33.0)
MCHC: 34.3 g/dL (ref 31.5–35.7)
MCV: 92 fL (ref 79–97)
Platelets: 166 10*3/uL (ref 150–450)
RBC: 5.42 x10E6/uL (ref 4.14–5.80)
RDW: 12.7 % (ref 11.6–15.4)
WBC: 7.5 10*3/uL (ref 3.4–10.8)

## 2021-05-08 LAB — COMPREHENSIVE METABOLIC PANEL
ALT: 33 IU/L (ref 0–44)
AST: 28 IU/L (ref 0–40)
Albumin/Globulin Ratio: 1.5 (ref 1.2–2.2)
Albumin: 4.6 g/dL (ref 3.8–4.9)
Alkaline Phosphatase: 125 IU/L — ABNORMAL HIGH (ref 44–121)
BUN/Creatinine Ratio: 17 (ref 9–20)
BUN: 22 mg/dL (ref 6–24)
Bilirubin Total: 0.8 mg/dL (ref 0.0–1.2)
CO2: 19 mmol/L — ABNORMAL LOW (ref 20–29)
Calcium: 9.5 mg/dL (ref 8.7–10.2)
Chloride: 105 mmol/L (ref 96–106)
Creatinine, Ser: 1.3 mg/dL — ABNORMAL HIGH (ref 0.76–1.27)
Globulin, Total: 3 g/dL (ref 1.5–4.5)
Glucose: 138 mg/dL — ABNORMAL HIGH (ref 70–99)
Potassium: 4.1 mmol/L (ref 3.5–5.2)
Sodium: 140 mmol/L (ref 134–144)
Total Protein: 7.6 g/dL (ref 6.0–8.5)
eGFR: 64 mL/min/{1.73_m2} (ref >59)

## 2021-05-08 LAB — LIPID PANEL
Chol/HDL Ratio: 7.6 ratio — ABNORMAL HIGH (ref 0.0–5.0)
Cholesterol, Total: 289 mg/dL — ABNORMAL HIGH (ref 100–199)
HDL: 38 mg/dL — ABNORMAL LOW (ref >39)
LDL Chol Calc (NIH): 237 mg/dL — ABNORMAL HIGH (ref 0–99)
Triglycerides: 85 mg/dL (ref 0–149)
VLDL Cholesterol Cal: 14 mg/dL (ref 5–40)

## 2021-05-08 LAB — PSA: Prostate Specific Ag, Serum: 17.7 ng/mL — ABNORMAL HIGH (ref 0.0–4.0)

## 2021-05-08 MED ORDER — ATORVASTATIN CALCIUM 20 MG PO TABS
20.0000 mg | ORAL_TABLET | Freq: Every day | ORAL | 3 refills | Status: DC
Start: 2021-05-08 — End: 2021-08-24
  Filled 2021-05-08: qty 30, 30d supply, fill #0
  Filled 2021-06-12: qty 30, 30d supply, fill #1

## 2021-05-08 NOTE — Telephone Encounter (Signed)
-----   Message from Dionne Bucy sent at 05/08/2021  2:41 PM EST ----- Regarding: RE: Cardiology Referral - please try to get in before surgery on 05/31/21 Patient is schedule for 12/6   ----- Message ----- From: Marcine Matar, MD Sent: 05/05/2021   8:37 AM EST To: Dionne Bucy Subject: Cardiology Referral - please try to get in b#  Please try to get him in with cardiology prior to his scheduled surgery for December 8.

## 2021-05-08 NOTE — Telephone Encounter (Signed)
Phone call placed to patient today to go over lab results.  Patient informed that his PSA level is elevated which is abnormal.  I recommend referral to the urologist to have this evaluated further.  Patient is agreeable to this. -Blood sugar elevated.  We will have the lab add A1c to screen for diabetes. -Kidney function not 100%.  Advised to avoid long-term use of over-the-counter NSAIDs. -Cell counts are normal. -LDL and total cholesterol are significantly elevated.  LDL cholesterol is 237.  He may have familial hypercholesterolemia.  I recommend starting atorvastatin to help lower cholesterol and in doing so decrease his risk for heart attack and strokes.  Prescription was sent to our pharmacy for pickup.  Results for orders placed or performed in visit on 05/04/21  CBC  Result Value Ref Range   WBC 7.5 3.4 - 10.8 x10E3/uL   RBC 5.42 4.14 - 5.80 x10E6/uL   Hemoglobin 17.0 13.0 - 17.7 g/dL   Hematocrit 49.6 37.5 - 51.0 %   MCV 92 79 - 97 fL   MCH 31.4 26.6 - 33.0 pg   MCHC 34.3 31.5 - 35.7 g/dL   RDW 12.7 11.6 - 15.4 %   Platelets 166 150 - 450 x10E3/uL  Comprehensive metabolic panel  Result Value Ref Range   Glucose 138 (H) 70 - 99 mg/dL   BUN 22 6 - 24 mg/dL   Creatinine, Ser 1.30 (H) 0.76 - 1.27 mg/dL   eGFR 64 >59 mL/min/1.73   BUN/Creatinine Ratio 17 9 - 20   Sodium 140 134 - 144 mmol/L   Potassium 4.1 3.5 - 5.2 mmol/L   Chloride 105 96 - 106 mmol/L   CO2 19 (L) 20 - 29 mmol/L   Calcium 9.5 8.7 - 10.2 mg/dL   Total Protein 7.6 6.0 - 8.5 g/dL   Albumin 4.6 3.8 - 4.9 g/dL   Globulin, Total 3.0 1.5 - 4.5 g/dL   Albumin/Globulin Ratio 1.5 1.2 - 2.2   Bilirubin Total 0.8 0.0 - 1.2 mg/dL   Alkaline Phosphatase 125 (H) 44 - 121 IU/L   AST 28 0 - 40 IU/L   ALT 33 0 - 44 IU/L  Lipid panel  Result Value Ref Range   Cholesterol, Total 289 (H) 100 - 199 mg/dL   Triglycerides 85 0 - 149 mg/dL   HDL 38 (L) >39 mg/dL   VLDL Cholesterol Cal 14 5 - 40 mg/dL   LDL Chol Calc (NIH) 237  (H) 0 - 99 mg/dL   Lipid Comment: Comment    Chol/HDL Ratio 7.6 (H) 0.0 - 5.0 ratio  PSA  Result Value Ref Range   Prostate Specific Ag, Serum 17.7 (H) 0.0 - 4.0 ng/mL    

## 2021-05-08 NOTE — Addendum Note (Signed)
Addended by: Jonah Blue B on: 05/08/2021 09:53 PM   Modules accepted: Orders

## 2021-05-09 ENCOUNTER — Other Ambulatory Visit: Payer: Self-pay

## 2021-05-11 LAB — HEMOGLOBIN A1C
Est. average glucose Bld gHb Est-mCnc: 117 mg/dL
Hgb A1c MFr Bld: 5.7 % — ABNORMAL HIGH (ref 4.8–5.6)

## 2021-05-11 LAB — SPECIMEN STATUS REPORT

## 2021-05-12 ENCOUNTER — Other Ambulatory Visit: Payer: Self-pay | Admitting: Internal Medicine

## 2021-05-12 ENCOUNTER — Encounter: Payer: Self-pay | Admitting: Internal Medicine

## 2021-05-12 DIAGNOSIS — R7303 Prediabetes: Secondary | ICD-10-CM

## 2021-05-12 DIAGNOSIS — R972 Elevated prostate specific antigen [PSA]: Secondary | ICD-10-CM | POA: Insufficient documentation

## 2021-05-12 NOTE — Progress Notes (Signed)
Let patient know that he has prediabetes.  Healthy eating habits and regular exercise will help to prevent progression to full diabetes.  However I want him to hold off on doing any exertional exercise until he has seen the cardiologist.  I will refer him to a nutritionist for dietary counseling.

## 2021-05-16 ENCOUNTER — Other Ambulatory Visit: Payer: Self-pay | Admitting: Family Medicine

## 2021-05-16 DIAGNOSIS — Z86711 Personal history of pulmonary embolism: Secondary | ICD-10-CM

## 2021-05-16 DIAGNOSIS — I82409 Acute embolism and thrombosis of unspecified deep veins of unspecified lower extremity: Secondary | ICD-10-CM

## 2021-05-16 NOTE — Telephone Encounter (Signed)
Requested Prescriptions  Pending Prescriptions Disp Refills  . XARELTO 20 MG TABS tablet [Pharmacy Med Name: XARELTO 20 MG TABLET] 30 tablet 2    Sig: TAKE 1 TABLET BY MOUTH EVERY DAY WITH SUPPER     Hematology: Anticoagulants - rivaroxaban Failed - 05/16/2021  1:52 AM      Failed - Cr in normal range and within 360 days    Creatinine, Ser  Date Value Ref Range Status  05/07/2021 1.30 (H) 0.76 - 1.27 mg/dL Final         Passed - ALT in normal range and within 180 days    ALT  Date Value Ref Range Status  05/07/2021 33 0 - 44 IU/L Final         Passed - AST in normal range and within 180 days    AST  Date Value Ref Range Status  05/07/2021 28 0 - 40 IU/L Final         Passed - HCT in normal range and within 360 days    Hematocrit  Date Value Ref Range Status  05/07/2021 49.6 37.5 - 51.0 % Final         Passed - HGB in normal range and within 360 days    Hemoglobin  Date Value Ref Range Status  05/07/2021 17.0 13.0 - 17.7 g/dL Final         Passed - PLT in normal range and within 360 days    Platelets  Date Value Ref Range Status  05/07/2021 166 150 - 450 x10E3/uL Final         Passed - Valid encounter within last 12 months    Recent Outpatient Visits          1 week ago Preoperative evaluation to rule out surgical contraindication   [facility] Jonah Blue B, MD   1 month ago Chronic midline low back pain without sciatica   [facility] [place], [place], [place]   3 months ago Recurrent acute deep vein thrombosis (DVT) of lower extremity, unspecified laterality [facility])   [facility] [place], [place] Horns, MD   2 years ago Acute deep vein thrombosis (DVT) of popliteal vein of right lower extremity [facility])   [facility] Marcine Matar, MD      Future Appointments            In 1 week Otelia Sergeant, Guy Sandifer, MD [facility]   In 1 week  Branch, Alben Spittle, MD [facility] [place], [facility]   In 3 weeks Marcine Matar, MD [facility]

## 2021-05-24 ENCOUNTER — Ambulatory Visit (INDEPENDENT_AMBULATORY_CARE_PROVIDER_SITE_OTHER): Payer: Medicaid Other | Admitting: Specialist

## 2021-05-24 ENCOUNTER — Encounter: Payer: Self-pay | Admitting: Specialist

## 2021-05-24 ENCOUNTER — Ambulatory Visit (INDEPENDENT_AMBULATORY_CARE_PROVIDER_SITE_OTHER): Payer: Medicaid Other

## 2021-05-24 ENCOUNTER — Telehealth: Payer: Self-pay | Admitting: Internal Medicine

## 2021-05-24 ENCOUNTER — Other Ambulatory Visit: Payer: Self-pay

## 2021-05-24 VITALS — BP 132/85 | HR 83 | Ht 73.0 in | Wt 227.0 lb

## 2021-05-24 DIAGNOSIS — M25551 Pain in right hip: Secondary | ICD-10-CM

## 2021-05-24 DIAGNOSIS — M25552 Pain in left hip: Secondary | ICD-10-CM

## 2021-05-24 DIAGNOSIS — M5416 Radiculopathy, lumbar region: Secondary | ICD-10-CM | POA: Diagnosis not present

## 2021-05-24 DIAGNOSIS — M545 Low back pain, unspecified: Secondary | ICD-10-CM | POA: Diagnosis not present

## 2021-05-24 DIAGNOSIS — M5136 Other intervertebral disc degeneration, lumbar region: Secondary | ICD-10-CM

## 2021-05-24 DIAGNOSIS — R29898 Other symptoms and signs involving the musculoskeletal system: Secondary | ICD-10-CM

## 2021-05-24 DIAGNOSIS — R972 Elevated prostate specific antigen [PSA]: Secondary | ICD-10-CM

## 2021-05-24 MED ORDER — PREGABALIN 75 MG PO CAPS: 75.0000 mg | ORAL_CAPSULE | Freq: Two times a day (BID) | ORAL | 1 refills | Status: DC

## 2021-05-24 NOTE — Telephone Encounter (Signed)
Copied from CRM [id number]. Topic: General - Other >> May 22, 2021 11:05 AM Pawlus, Maxine Glenn A wrote: Reason for CRM: Pt called in stating he is still has some questions regarding his latest lab results, pt requested a call back.

## 2021-05-24 NOTE — Progress Notes (Signed)
Office Visit Note   Patient: Keith Potter           Date of Birth: 11-28-1965           MRN: [id number] Visit Date: 05/24/2021              Requested by: Marcine Matar, MD [address] [place],  [place] [address] PCP: Marcine Matar, MD   Assessment & Plan: Visit Diagnoses:  1. Radiculopathy, lumbar region   2. Low back pain, unspecified back pain laterality, unspecified chronicity, unspecified whether sciatica present   3. Degenerative disc disease, lumbar   4. Bilateral hip pain     Plan: Avoid frequent bending and stooping  No lifting greater than 10 lbs. May use ice or moist heat for pain. Weight loss is of benefit. Best medication for lumbar disc disease is arthritis medications like motrin, celebrex and naprosyn. Exercise is important to improve your indurance and does allow people to function better inspite of back pain. There are changes seen in the plain radiographs show sclerosis in the lower lumbar and lumbosacral segments that may  Represent changes due to arthritis or may relate to prostate pathology. MRI is reordered to assess for disc disease or HNP and  For concerns of metastatic disease related to prostate.  Cane to assist with ambulation and decrease stress on the right leg, due to weakness.  Follow-Up Instructions: Return in about 3 weeks (around 06/14/2021).  Pregablin 75 mg po up to BID. Orders:  Orders Placed This Encounter  Procedures   XR HIP UNILAT W OR W/O PELVIS 2-3 VIEWS RIGHT   XR HIP UNILAT W OR W/O PELVIS 1V LEFT   No orders of the defined types were placed in this encounter.     Procedures: No procedures performed   Clinical Data: Findings:  PSA is elevated at 17.7 Alkaline phosphatase is elevated slightly. 125   Subjective: Chief Complaint  Patient presents with   Lower Back - Follow-up    Still hurting    55 year old male with history of increasing back pain and pain into both hips. He has pain into the back  with getting up in the middle of the night. He is able to reach his shoes but has difficulty standing in one place for walking due to pain. Night pain with lying still and lying onright side is more painful than on the left but both hurt and he sleeps on his back.  Review of Systems  Constitutional: Negative.   HENT: Negative.    Eyes: Negative.   Respiratory: Negative.    Cardiovascular: Negative.   Gastrointestinal: Negative.   Endocrine: Negative.   Genitourinary: Negative.   Musculoskeletal: Negative.   Skin: Negative.   Allergic/Immunologic: Negative.   Neurological: Negative.   Hematological: Negative.   Psychiatric/Behavioral: Negative.      Objective: Vital Signs: BP 132/85 (BP Location: Left Arm, Patient Position: Sitting)   Pulse 83   Ht 6\' 1"  (1.854 m)   Wt 227 lb (103 kg)   BMI 29.95 kg/m   Physical Exam Constitutional:      Appearance: He is well-developed.  HENT:     Head: Normocephalic and atraumatic.  Eyes:     Pupils: Pupils are equal, round, and reactive to light.  Pulmonary:     Effort: Pulmonary effort is normal.     Breath sounds: Normal breath sounds.  Abdominal:     General: Bowel sounds are normal.  Palpations: Abdomen is soft.  Musculoskeletal:     Cervical back: Normal range of motion and neck supple.     Lumbar back: Positive right straight leg raise test and positive left straight leg raise test.  Skin:    General: Skin is warm and dry.  Neurological:     Mental Status: He is alert and oriented to person, place, and time.  Psychiatric:        Behavior: Behavior normal.        Thought Content: Thought content normal.        Judgment: Judgment normal.    Right Hip Exam   Tenderness  The patient is experiencing tenderness in the greater trochanter.  Range of Motion  Extension:  abnormal  Flexion:  70 abnormal  External rotation:  normal  Internal rotation:  normal    Left Hip Exam   Tenderness  The patient is  experiencing tenderness in the greater trochanter.  Range of Motion  Flexion:  90 abnormal  External rotation:  normal  Internal rotation: normal    Back Exam   Tenderness  The patient is experiencing tenderness in the lumbar.  Range of Motion  Extension:  abnormal  Flexion:  abnormal  Lateral bend right:  abnormal  Lateral bend left:  abnormal  Rotation right:  abnormal  Rotation left:  abnormal   Muscle Strength  Right Quadriceps:  5/5  Left Quadriceps:  5/5  Right Hamstrings:  5/5  Left Hamstrings:  5/5   Tests  Straight leg raise right: positive Straight leg raise left: positive  Other  Toe walk: normal Heel walk: normal Gait: antalgic   Comments:  Right foot painful with pressing on the dorsum.     Specialty Comments:  No specialty comments available.  Imaging: XR HIP UNILAT W OR W/O PELVIS 1V LEFT  Result Date: 05/24/2021 Lateral left hip with normal sphericity of the left hip there is mild loss of the normal head-neck contour and mild tendency to CAM affect. The joint line is well maintained, no subchondral sclerosis. An area of increased bone density Noted in the left iliac wing centrally, lateral and superior to the left acetabulum.  XR HIP UNILAT W OR W/O PELVIS 2-3 VIEWS RIGHT  Result Date: 05/24/2021 AP and lateral right hip with similar appearance to that of the left hip joint line is well maintained, mild lateral loss of head-neck contour. There is sclerosis about the L4-5 and L5-S1 levels with lucency over the lateral left L5 pedicle. These may represent sclerosis associated with degenerative changes of disc and arthrosis of the facet but may also represent areas of pathologic change and can be associated with prostate concerns.     PMFS History: Patient Active Problem List   Diagnosis Date Noted   Prediabetes 05/12/2021   Elevated PSA 05/12/2021   Former smoker 05/05/2021   Essential hypertension 05/05/2021   Closed fracture of right orbit  (HCC) 01/05/2021   Fracture of alveolus of right mandible, initial encounter for closed fracture (HCC) 01/05/2021   Tobacco dependence 04/23/2019   Marijuana user 04/23/2019   Rectal bleed 04/23/2019   History of pulmonary embolism 04/23/2019   DVT (deep venous thrombosis) (HCC) 03/31/2019   Pulmonary emboli (HCC) 03/27/2019   Hyperlipidemia 01/10/2019   Healthcare maintenance 01/10/2019   Pulmonary embolism (HCC) 12/28/2018   History of DVT (deep vein thrombosis) 12/28/2018   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   Chronic pain of toe, right 06/06/2016   Deformity of  toe, right 06/06/2016   Increased frequency of urination 06/09/2013   Nocturia 06/09/2013   Past Medical History:  Diagnosis Date   DVT (deep venous thrombosis) (HCC)    Pulmonary emboli (HCC)     History reviewed. No pertinent family history.  Past Surgical History:  Procedure Laterality Date   AMPUTATION TOE     APPENDECTOMY     HERNIA REPAIR     Social History   Occupational History   Not on file  Tobacco Use   Smoking status: Former    Packs/day: 0.50    Types: Cigarettes   Smokeless tobacco: Never  Vaping Use   Vaping Use: Never used  Substance and Sexual Activity   Alcohol use: Not Currently   Drug use: Yes    Types: Marijuana   Sexual activity: Yes    Partners: Female    Birth control/protection: Condom

## 2021-05-24 NOTE — Patient Instructions (Signed)
Plan: Avoid frequent bending and stooping  No lifting greater than 10 lbs. May use ice or moist heat for pain. Weight loss is of benefit. Best medication for lumbar disc disease is arthritis medications like motrin, celebrex and naprosyn. Exercise is important to improve your indurance and does allow people to function better inspite of back pain. There are changes seen in the plain radiographs show sclerosis in the lower lumbar and lumbosacral segments that may  Represent changes due to arthritis or may relate to prostate pathology. MRI is reordered to assess for disc disease or HNP and  For concerns of metastatic disease related to prostate.  Cane to assist with ambulation and decrease stress on the right leg, due to weakness.  Follow-Up Instructions: Return in about 3 weeks (around 06/14/2021).  Pregablin 75 mg po up to BID. Orders:

## 2021-05-28 NOTE — Telephone Encounter (Signed)
Returned pt call and went over lab results pt doesn't have any questions or concerns  °

## 2021-05-28 NOTE — Progress Notes (Addendum)
Cardiology Office Note:    Date:  05/29/2021   ID:  Keith Potter, DOB [birth year], MRN [id number]  PCP:  Marcine Matar, MD   [facility] Providers Cardiologist:  Maisie Fus, MD     Referring MD: Marcine Matar, MD   No chief complaint on file. Pre-Op Evaluation  History of Present Illness:    Keith Potter is a 55 y.o. 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 He said it was at night. He just finished eating. He had to stand up and stretch. It went away and then returned. He has a sharp pain/ and cramping. During the day, he doesn't do to much during the day. He walks to the store, he does not notice it. He gets some shortness of breath with walking. This has been 2-3 weeks. He lost his toes in a work accident.   He has not seen a cardiologist. He has no hx of stress test or LHC.   CVD Risk/Equivalent: HLD- yes  on atorvastatin HTN- yes, on norvasc 5 mg  PAD- no DMII -  pre diabetes A1c 5.7 Smoker- smoked cigarettes for 30 years, quit 3 months ago Premature Family History- No   Past Medical History:  Diagnosis Date   DVT (deep venous thrombosis) (HCC)    Pulmonary emboli (HCC)     Past Surgical History:  Procedure Laterality Date   AMPUTATION TOE     APPENDECTOMY     HERNIA REPAIR      Current Medications: Current Meds  Medication Sig   amLODipine (NORVASC) 5 MG tablet Take 1 tablet (5 mg total) by mouth daily.   atorvastatin (LIPITOR) 20 MG tablet Take 1 tablet (20 mg total) by mouth daily.   cyclobenzaprine (FLEXERIL) 10 MG tablet Take 1 tablet (10 mg total) by mouth 3 (three) times daily as needed for muscle spasms.   methylPREDNISolone (MEDROL) 4 MG tablet 6 day taper to be taken as directed.   pregabalin (LYRICA) 75 MG capsule Take 1 capsule (75 mg total) by mouth  2 (two) times daily.   tamsulosin (FLOMAX) 0.4 MG CAPS capsule Take 1 capsule (0.4 mg total) by mouth daily.   XARELTO 20 MG TABS tablet TAKE 1 TABLET BY MOUTH EVERY DAY WITH SUPPER     Allergies:   Patient has no allergy information on record.   Social History   Socioeconomic History   Marital status: Single    Spouse name: Not on file   Number of children: Not on file   Years of education: Not on file   Highest education level: Not on file  Occupational History   Not on file  Tobacco Use   Smoking status: Former    Packs/day: 0.50    Types: Cigarettes   Smokeless tobacco: Never  Vaping Use   Vaping Use: Never used  Substance and Sexual Activity   Alcohol use: Not Currently   Drug use: Yes    Types: Marijuana   Sexual activity: Yes    Partners: Female    Birth control/protection: Condom  Other Topics Concern   Not on file  Social History Narrative   Not on file   Social Determinants of Health   Financial Resource Strain: Not on file  Food Insecurity: Not on  file  Transportation Needs: Not on file  Physical Activity: Not on file  Stress: Not on file  Social Connections: Not on file     Family History: The patient's family history includes Diabetes in his father and mother.  ROS:   Please see the history of present illness.     All other systems reviewed and are negative.  EKGs/Labs/Other Studies Reviewed:    The following studies were reviewed today:   EKG:  EKG is  ordered today.  The ekg ordered today demonstrates   NSR HR 71 bpm, Qtc 402 ms  Recent Labs: 05/07/2021: ALT 33; BUN 22; Creatinine, Ser 1.30; Hemoglobin 17.0; Platelets 166; Potassium 4.1; Sodium 140  Recent Lipid Panel    Component Value Date/Time   CHOL 289 (H) 05/07/2021 0938   TRIG 85 05/07/2021 0938   HDL 38 (L) 05/07/2021 0938   CHOLHDL 7.6 (H) 05/07/2021 0938   CHOLHDL 5.5 12/28/2018 2042   VLDL 15 12/28/2018 2042   LDLCALC 237 (H) 05/07/2021 0938     Risk  Assessment/Calculations:     The 10-year ASCVD risk score (Arnett DK, et al., 2019) is: 14.7%   Values used to calculate the score:     Age: 25 years     Sex: Male     Is Non-Hispanic African American: Yes     Diabetic: No     Tobacco smoker: No     Systolic Blood Pressure: 130 mmHg     Is BP treated: Yes     HDL Cholesterol: 38 mg/dL     Total Cholesterol: 289 mg/dL       Physical Exam:    VS:   Vitals:   05/29/21 1020  BP: 130/76  Pulse: 71  SpO2: 95%     Wt Readings from Last 3 Encounters:  05/29/21 237 lb 6.4 oz (107.7 kg)  05/24/21 227 lb (103 kg)  05/04/21 227 lb (103 kg)     GEN:  Well nourished, well developed in no acute distress HEENT: Normal NECK: No JVD; No carotid bruits LYMPHATICS: No lymphadenopathy CARDIAC: RRR, no murmurs, rubs, gallops RESPIRATORY:  Clear to auscultation without rales, wheezing or rhonchi  ABDOMEN: Soft, non-tender, non-distended MUSCULOSKELETAL:  No edema; No deformity  SKIN: Warm and dry NEUROLOGIC:  Alert and oriented x 3 PSYCHIATRIC:  Normal affect   ASSESSMENT:    #Pre-op: Patient has moderate ASCVD risk, ongoing smoking, and symptoms of exertional chest pain. Mets - He does not typically walk up stairs per patient. He get dyspnea with walking. Ischemic work up - Risk manager - will follow up stress test for further guidance. Continue statin. Continue norvasc 5 mg daily, his blood pressure is well controlled. Xarelto hold per surgery preference.     PLAN:    In order of problems listed above:  NM Nuclear SPECT Follow up 3 months      Shared Decision Making/Informed Consent The risks [chest pain, shortness of breath, cardiac arrhythmias, dizziness, blood pressure fluctuations, myocardial infarction, stroke/transient ischemic attack, nausea, vomiting, allergic reaction, radiation exposure, metallic taste sensation and life-threatening complications (estimated to be 1 in 10,000)], benefits (risk  stratification, diagnosing coronary artery disease, treatment guidance) and alternatives of a nuclear stress test were discussed in detail with Mr. Goyne and he agrees to proceed.    Medication Adjustments/Labs and Tests Ordered: Current medicines are reviewed at length with the patient today.  Concerns regarding medicines are outlined above.    Signed, Maisie Fus, MD  05/29/2021 10:48 AM    [facility]

## 2021-05-29 ENCOUNTER — Encounter: Payer: Self-pay | Admitting: Internal Medicine

## 2021-05-29 ENCOUNTER — Ambulatory Visit (INDEPENDENT_AMBULATORY_CARE_PROVIDER_SITE_OTHER): Payer: Medicaid Other | Admitting: Internal Medicine

## 2021-05-29 ENCOUNTER — Other Ambulatory Visit: Payer: Self-pay

## 2021-05-29 VITALS — BP 130/76 | HR 71 | Ht 73.0 in | Wt 237.4 lb

## 2021-05-29 DIAGNOSIS — Z01810 Encounter for preprocedural cardiovascular examination: Secondary | ICD-10-CM | POA: Diagnosis not present

## 2021-05-29 DIAGNOSIS — R072 Precordial pain: Secondary | ICD-10-CM

## 2021-05-29 NOTE — Addendum Note (Signed)
Addended by: Bea Laura B on: 05/29/2021 01:54 PM   Modules accepted: Orders

## 2021-05-29 NOTE — Addendum Note (Signed)
Addended byCarolan Clines on: 05/29/2021 01:55 PM   Modules accepted: Orders

## 2021-05-29 NOTE — Patient Instructions (Addendum)
Medication Instructions:  No Changes In Medications at this time.  *If you need a refill on your cardiac medications before your next appointment, please call your pharmacy*  Testing/Procedures: Your physician has requested that you have a lexiscan myoview. For further information please visit [email or web address] Please follow instruction sheet, as given. This will take place at [address]  Follow-Up: At [facility], you and your health needs are our priority.  As part of our continuing mission to provide you with exceptional heart care, we have created designated Provider Care Teams.  These Care Teams include your primary Cardiologist (physician) and Advanced Practice Providers (APPs -  Physician Assistants and Nurse Practitioners) who all work together to provide you with the care you need, when you need it.  Your next appointment:   3 month(s)  The format for your next appointment:   In Person  Provider:   Maisie Fus, MD

## 2021-05-30 ENCOUNTER — Telehealth (HOSPITAL_COMMUNITY): Payer: Self-pay | Admitting: *Deleted

## 2021-05-30 NOTE — Telephone Encounter (Signed)
Patient given detailed instructions per Myocardial Perfusion Study Information Sheet for the test on 06/06/21 at 1015. Patient notified to arrive 15 minutes early and that it is imperative to arrive on time for appointment to keep from having the test rescheduled.  If you need to cancel or reschedule your appointment, please call the office within 24 hours of your appointment. . Patient verbalized understanding.Avagrace Botelho, Adelene Idler

## 2021-06-05 ENCOUNTER — Other Ambulatory Visit: Payer: Self-pay

## 2021-06-05 ENCOUNTER — Ambulatory Visit: Payer: Medicaid Other | Attending: Specialist

## 2021-06-05 DIAGNOSIS — M6281 Muscle weakness (generalized): Secondary | ICD-10-CM | POA: Diagnosis not present

## 2021-06-05 DIAGNOSIS — M545 Low back pain, unspecified: Secondary | ICD-10-CM | POA: Diagnosis not present

## 2021-06-05 DIAGNOSIS — M5416 Radiculopathy, lumbar region: Secondary | ICD-10-CM | POA: Diagnosis not present

## 2021-06-05 DIAGNOSIS — M5441 Lumbago with sciatica, right side: Secondary | ICD-10-CM | POA: Diagnosis present

## 2021-06-05 DIAGNOSIS — R262 Difficulty in walking, not elsewhere classified: Secondary | ICD-10-CM | POA: Diagnosis present

## 2021-06-05 DIAGNOSIS — G8929 Other chronic pain: Secondary | ICD-10-CM | POA: Diagnosis present

## 2021-06-05 NOTE — Therapy (Signed)
OUTPATIENT PHYSICAL THERAPY THORACOLUMBAR EVALUATION   Patient Name: Keith Potter MRN: [id number] DOB:March 16, 1966, 55 y.o., male Today's Date: 06/05/2021   PT End of Session - 06/05/21 0926     Visit Number 1    Number of Visits 17    Date for PT Re-Evaluation 07/31/21    Authorization Type [employer]    PT Start Time 0910    PT Stop Time 0950    PT Time Calculation (min) 40 min    Activity Tolerance Patient tolerated treatment well    Behavior During Therapy [facility] for tasks assessed/performed             Past Medical History:  Diagnosis Date   DVT (deep venous thrombosis) (HCC)    Pulmonary emboli (HCC)    Past Surgical History:  Procedure Laterality Date   AMPUTATION TOE     APPENDECTOMY     HERNIA REPAIR     Patient Active Problem List   Diagnosis Date Noted   Prediabetes 05/12/2021   Elevated PSA 05/12/2021   Former smoker 05/05/2021   Essential hypertension 05/05/2021   Closed fracture of right orbit (HCC) 01/05/2021   Fracture of alveolus of right mandible, initial encounter for closed fracture (HCC) 01/05/2021   Tobacco dependence 04/23/2019   Marijuana user 04/23/2019   Rectal bleed 04/23/2019   History of pulmonary embolism 04/23/2019   DVT (deep venous thrombosis) (HCC) 03/31/2019   Pulmonary emboli (HCC) 03/27/2019   Hyperlipidemia 01/10/2019   Healthcare maintenance 01/10/2019   Pulmonary embolism (HCC) 12/28/2018   History of DVT (deep vein thrombosis) 12/28/2018   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   Chronic pain of toe, right 06/06/2016   Deformity of toe, right 06/06/2016   Increased frequency of urination 06/09/2013   Nocturia 06/09/2013    PCP: Marcine Matar, MD  REFERRING PROVIDER: Kerrin Champagne, MD  REFERRING DIAG: M54.16 (ICD-10-CM) - Radiculopathy, lumbar region M54.50 (ICD-10-CM) - Low back pain, unspecified back pain laterality, unspecified chronicity, unspecified whether sciatica present  THERAPY DIAG:   Muscle weakness (generalized)  Chronic right-sided low back pain with right-sided sciatica  Difficulty in walking, not elsewhere classified  ONSET DATE: Chronic  SUBJECTIVE:                                                                                                                                                                                           SUBJECTIVE STATEMENT: Pt presents to PT with reports of R lower back pain and referral into R LE. Notes pain has gradually increasing over the last 7 years. Denies b/b changes or saddle anesthesia, has noted increased  unsteadiness with no falls. Notes lying supine as position of preference, has increased pain with movement from supine>sit and sit>stand. PERTINENT HISTORY:  Chronic hx of R sided LBP with referral into posterior R LE  PAIN:  Are you having pain? Yes VAS scale: 8/10 Pain location: R side of lower back; R LE PAIN TYPE: aching and dull Pain description: constant  Aggravating factors: walking, transfer Relieving factors: medication, prone lying  PRECAUTIONS: None  WEIGHT BEARING RESTRICTIONS No  FALLS:  Has patient fallen in last 6 months? No, Number of falls: N/A  LIVING ENVIRONMENT: Lives with: lives alone Lives in: House/apartment Stairs: No; N/A Has following equipment at home: None  OCCUPATION: Not currently working  PLOF: Independent and Independent with basic ADLs  PATIENT GOALS Pt wants to decrease LBP in order to improve comfort and functional mobility, especially moving from sit>stand   OBJECTIVE:   DIAGNOSTIC FINDINGS:  MRI Pending  PATIENT SURVEYS:  Modified Oswestry 62% disability   SCREENING FOR RED FLAGS: Bowel or bladder incontinence: No Spinal tumors: No Cauda equina syndrome: No Compression fracture: No Abdominal aneurysm: No  COGNITION:  Overall cognitive status: Within functional limits for tasks assessed     SENSATION:  Light touch: Appears intact  POSTURE:   Increased trunk flexion, reduced lordosis  LE MMT: Myotomes WNL bilaterally  MMT Right 06/05/2021 Left 06/05/2021  Hip flexion    Hip extension    Hip abduction    Hip adduction    Hip internal rotation    Hip external rotation    Knee flexion    Knee extension    Ankle dorsiflexion    Ankle plantarflexion    Ankle inversion    Ankle eversion     (Blank rows = not tested)  LUMBAR SPECIAL TESTS:  Straight leg raise test: Positive and Slump test: Positive  SPINAL SEGMENTAL MOBILITY ASSESSMENT:  Decreased lumbar mobility L2-L5 through spring testing  FUNCTIONAL TESTS:  30" STS: 3 reps  GAIT: Distance walked: 42ft Assistive device utilized: None Level of assistance: Complete Independence Comments: trunk flexed, R antalgia    TODAY'S TREATMENT  Therapeutic Exercise:  Seated sciatic nerve glide x 10 POE x 60" LTR x 5  Manual Therapy: N/A   Neuromuscular re-ed: N/A   Therapeutic Activity: N/A   Modalities: N/A   Self Care: N/A   Consider / progression for next session:    PATIENT EDUCATION:  Education details: eval findings, ODI, HEP, POC Person educated: Patient Education method: Explanation, Demonstration, Tactile cues, and Handouts Education comprehension: verbalized understanding and returned demonstration   HOME EXERCISE PROGRAM: Access Code: [email or web address]  ASSESSMENT:  CLINICAL IMPRESSION: Patient is a pleasant 55 y/o M who presents to PT with reports of chronic hx of R sided lower back with referral into R LE. Physical findings are consistent with MD impression of lumbar radiculopathy, with pt demonstrating positive slump, SLR and decreased lumbar mobility. Pt shows reduced functional mobility and proximal hip strength, as evidenced by decreased rep count during 30" STS. Likewise, his ODI score shows severe disability, indicating he is operating well below his PLOF and would benefit from skilled PT services focusing on improving core endurance,  LE strength, and functional mobility in order to decrease pain and improve function. PT will assess response to initial HEP and progress as able.   REHAB POTENTIAL: Good  CLINICAL DECISION MAKING: Stable/uncomplicated  EVALUATION COMPLEXITY: Low   GOALS:  SHORT TERM GOALS:  STG Name Target Date Goal status  1 Pt will  be compliant and knowledgeable with 90% of initial HEP for improved comfort and carryover Baseline:  06/26/21 INITIAL   LONG TERM GOALS:   LTG Name Target Date Goal status  1 Pt will improve Modified ODI to no less than 40% as proxy for functional improvement Baseline: 62% 2/7/20232 INITIAL  2 Pt will self report R sided LBP no greater than 3/10 at worst for improved comfort and function Baseline: 07/31/2021 INITIAL  3 Pt will improve reps in 30" STS to no less than 6 in order to improve functional mobility and decrease fall risk Baseline: 3 reps 07/31/2021 INITIAL  4 Pt will be able to squat and lift 25lb KB from floor to chest level for improved functional ability to perform ADLs Baseline: unable 07/31/2021 INITIAL   PLAN: PT FREQUENCY: 1-2x/week  PT DURATION: 8 weeks  PLANNED INTERVENTIONS: Therapeutic exercises, Therapeutic activity, Neuro Muscular re-education, Balance training, Gait training, Patient/Family education, Joint mobilization, Stair training, Electrical stimulation, Spinal mobilization, Cryotherapy, Moist heat, Taping, and Manual therapy  PLAN FOR NEXT SESSION: assess HEP response, especially extension pref; progress core endurance, LE strength, and functional mobility as able   Eloy End 06/05/2021, 11:23 AM

## 2021-06-06 ENCOUNTER — Encounter (HOSPITAL_COMMUNITY): Payer: Medicaid Other

## 2021-06-06 ENCOUNTER — Encounter: Payer: Medicaid Other | Admitting: Podiatry

## 2021-06-09 NOTE — Therapy (Signed)
OUTPATIENT PHYSICAL THERAPY TREATMENT NOTE   Patient Name: Keith Potter MRN: [id number] DOB:[birth year], 55 y.o., male Today's Date: 06/13/2021  PCP: Marcine Matar, MD REFERRING PROVIDER: Kerrin Champagne, MD   PT End of Session - 06/13/21 [phone]     Visit Number 2    Number of Visits 17    Date for PT Re-Evaluation 07/31/21    Authorization Type [employer]    PT Start Time 0830    PT Stop Time 0912    PT Time Calculation (min) 42 min    Activity Tolerance Patient tolerated treatment well    Behavior During Therapy [facility] for tasks assessed/performed             Past Medical History:  Diagnosis Date   DVT (deep venous thrombosis) (HCC)    Pulmonary emboli (HCC)    Past Surgical History:  Procedure Laterality Date   AMPUTATION TOE     APPENDECTOMY     HERNIA REPAIR     Patient Active Problem List   Diagnosis Date Noted   Prediabetes 05/12/2021   Elevated PSA 05/12/2021   Former smoker 05/05/2021   Essential hypertension 05/05/2021   Closed fracture of right orbit (HCC) 01/05/2021   Fracture of alveolus of right mandible, initial encounter for closed fracture (HCC) 01/05/2021   Tobacco dependence 04/23/2019   Marijuana user 04/23/2019   Rectal bleed 04/23/2019   History of pulmonary embolism 04/23/2019   DVT (deep venous thrombosis) (HCC) 03/31/2019   Pulmonary emboli (HCC) 03/27/2019   Hyperlipidemia 01/10/2019   Healthcare maintenance 01/10/2019   Pulmonary embolism (HCC) 12/28/2018   History of DVT (deep vein thrombosis) 12/28/2018   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   Chronic pain of toe, right 06/06/2016   Deformity of toe, right 06/06/2016   Increased frequency of urination 06/09/2013   Nocturia 06/09/2013    REFERRING DIAG: REFERRING DIAG: M54.16 (ICD-10-CM) - Radiculopathy, lumbar region M54.50 (ICD-10-CM) - Low back pain, unspecified back pain laterality, unspecified chronicity, unspecified whether sciatica present  THERAPY DIAG:   Muscle weakness (generalized)  Chronic right-sided low back pain with right-sided sciatica  Difficulty in walking, not elsewhere classified  PERTINENT HISTORY:  Chronic hx of R sided LBP with referral into posterior R LE  PRECAUTIONS: None   WEIGHT BEARING RESTRICTIONS No  SUBJECTIVE:   Pt reports that he tried to doe POE at home, but this did not go well d/t pain   PAIN:  Are you having pain? Yes NPRS scale: 6-7/10 Pain location: R side of lower back; R LE PAIN TYPE: aching and dull Pain description: constant  Aggravating factors: walking, transfer Relieving factors: medication, prone lying    OBJECTIVE:  LE MMT: Myotomes WNL bilaterally   MMT Right 06/05/2021 Left 06/05/2021  Hip flexion      Hip extension      Hip abduction      Hip adduction      Hip internal rotation      Hip external rotation      Knee flexion      Knee extension      Ankle dorsiflexion      Ankle plantarflexion      Ankle inversion      Ankle eversion       (Blank rows = not tested)  TREATMENT 12/21  Therapeutic Exercise:   nu-step L5 3m while taking subjective and planning session with patient Seated sciatic nerve glide x 10 LTR x 20 Supine clam - RTB -  3x10 Hip adduction ball squeeze - 3x10 Supine bridge with legs on bolster - very small arc - 3x10 Pelvic tilting with feet on bolster - 3x10 Abdominal contraction with bil shoulder ext - 3x10 GTB Tried manual traction - not tolerated well  Patient Education: - HEP was updated and reissued to patient; pt educated on HEP, was provided handout, and verbally confirmed understanding of exercises.  HOME EXERCISE PROGRAM: Access Code: [email or web address] URL: [email or web address] Date: 06/13/2021 Prepared by: Alphonzo Severance  Exercises Seated Sciatic Tensioner - 1-2 x daily - 7 x weekly - 2 sets - 15 reps Supine Lower Trunk Rotation - 1-2 x daily - 7 x weekly - 2 sets - 10 reps Seated Hip Abduction with Resistance - 1  x daily - 7 x weekly - 3 sets - 10 reps Seated Isometric Hip Adduction with Pelvic Floor Contraction - 1 x daily - 7 x weekly - 3 sets - 10 reps    ASSESSMENT:   CLINICAL IMPRESSION: [name] is progressing fair with therapy.  Pt reports no increase in baseline pain following therapy.  Today we concentrated on core strengthening and hip strengthening.  Pt did not respond well to ext at home.  He was limited in all exercise d/t pain, but had no increase overall after therapy.  He did not respond well to manual traction.  Pt will continue to benefit from skilled physical therapy to address remaining deficits and achieve listed goals.  Continue per POC.     GOALS:   SHORT TERM GOALS:   STG Name Target Date Goal status  1 Pt will be compliant and knowledgeable with 90% of initial HEP for improved comfort and carryover Baseline:  06/26/21 INITIAL    LONG TERM GOALS:    LTG Name Target Date Goal status  1 Pt will improve Modified ODI to no less than 40% as proxy for functional improvement Baseline: 62% 2/7/20232 INITIAL  2 Pt will self report R sided LBP no greater than 3/10 at worst for improved comfort and function Baseline: 07/31/2021 INITIAL  3 Pt will improve reps in 30" STS to no less than 6 in order to improve functional mobility and decrease fall risk Baseline: 3 reps 07/31/2021 INITIAL  4 Pt will be able to squat and lift 25lb KB from floor to chest level for improved functional ability to perform ADLs Baseline: unable 07/31/2021 INITIAL    PLAN: PT FREQUENCY: 1-2x/week   PT DURATION: 8 weeks   PLANNED INTERVENTIONS: Therapeutic exercises, Therapeutic activity, Neuro Muscular re-education, Balance training, Gait training, Patient/Family education, Joint mobilization, Stair training, Electrical stimulation, Spinal mobilization, Cryotherapy, Moist heat, Taping, and Manual therapy   PLAN FOR NEXT SESSION: assess HEP response, especially extension pref; progress core endurance, LE strength,  and functional mobility as able   Fredderick Phenix 06/13/2021, 9:15 AM

## 2021-06-11 ENCOUNTER — Ambulatory Visit (HOSPITAL_COMMUNITY): Payer: Medicaid Other | Attending: Internal Medicine

## 2021-06-11 ENCOUNTER — Other Ambulatory Visit: Payer: Self-pay

## 2021-06-11 DIAGNOSIS — R072 Precordial pain: Secondary | ICD-10-CM | POA: Diagnosis present

## 2021-06-11 LAB — MYOCARDIAL PERFUSION IMAGING
LV dias vol: 120 mL (ref 62–150)
LV sys vol: 60 mL
Nuc Stress EF: 50 %
Peak HR: 80 {beats}/min
Rest HR: 61 {beats}/min
Rest Nuclear Isotope Dose: 10.8 mCi
SDS: 1
SRS: 1
SSS: 2
Stress Nuclear Isotope Dose: 30.6 mCi
TID: 1.02

## 2021-06-11 MED ORDER — TECHNETIUM TC 99M TETROFOSMIN IV KIT
10.8000 | PACK | Freq: Once | INTRAVENOUS | Status: AC | PRN
  Administered 2021-06-11: 10.8 via INTRAVENOUS
  Filled 2021-06-11: qty 11

## 2021-06-11 MED ORDER — TECHNETIUM TC 99M TETROFOSMIN IV KIT
30.9000 | PACK | Freq: Once | INTRAVENOUS | Status: AC | PRN
  Administered 2021-06-11: 13:00:00 30.9 via INTRAVENOUS
  Filled 2021-06-11: qty 31

## 2021-06-11 MED ORDER — REGADENOSON 0.4 MG/5ML IV SOLN
0.4000 mg | Freq: Once | INTRAVENOUS | Status: AC
  Administered 2021-06-11: 0.4 mg via INTRAVENOUS

## 2021-06-11 NOTE — Therapy (Signed)
OUTPATIENT PHYSICAL THERAPY THORACOLUMBAR EVALUATION   Patient Name: Keith Potter MRN: [id number] DOB:1966/03/24, 55 y.o., male Today's Date: 06/11/2021     Past Medical History:  Diagnosis Date   DVT (deep venous thrombosis) (HCC)    Pulmonary emboli (HCC)    Past Surgical History:  Procedure Laterality Date   AMPUTATION TOE     APPENDECTOMY     HERNIA REPAIR     Patient Active Problem List   Diagnosis Date Noted   Prediabetes 05/12/2021   Elevated PSA 05/12/2021   Former smoker 05/05/2021   Essential hypertension 05/05/2021   Closed fracture of right orbit (HCC) 01/05/2021   Fracture of alveolus of right mandible, initial encounter for closed fracture (HCC) 01/05/2021   Tobacco dependence 04/23/2019   Marijuana user 04/23/2019   Rectal bleed 04/23/2019   History of pulmonary embolism 04/23/2019   DVT (deep venous thrombosis) (HCC) 03/31/2019   Pulmonary emboli (HCC) 03/27/2019   Hyperlipidemia 01/10/2019   Healthcare maintenance 01/10/2019   Pulmonary embolism (HCC) 12/28/2018   History of DVT (deep vein thrombosis) 12/28/2018   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   Chronic pain of toe, right 06/06/2016   Deformity of toe, right 06/06/2016   Increased frequency of urination 06/09/2013   Nocturia 06/09/2013    PCP: Marcine Matar, MD  REFERRING PROVIDER: Kerrin Champagne, MD  REFERRING DIAG: M54.16 (ICD-10-CM) - Radiculopathy, lumbar region M54.50 (ICD-10-CM) - Low back pain, unspecified back pain laterality, unspecified chronicity, unspecified whether sciatica present  THERAPY DIAG:  Muscle weakness (generalized)  Chronic right-sided low back pain with right-sided sciatica  Difficulty in walking, not elsewhere classified  ONSET DATE: Chronic  SUBJECTIVE:                                                                                                                                                                                            SUBJECTIVE STATEMENT: Pt presents to PT with reports of R lower back pain and referral into R LE. Notes pain has gradually increasing over the last 7 years. Denies b/b changes or saddle anesthesia, has noted increased unsteadiness with no falls. Notes lying supine as position of preference, has increased pain with movement from supine>sit and sit>stand. PERTINENT HISTORY:  Chronic hx of R sided LBP with referral into posterior R LE  PAIN:  Are you having pain? Yes VAS scale: 8/10 Pain location: R side of lower back; R LE PAIN TYPE: aching and dull Pain description: constant  Aggravating factors: walking, transfer Relieving factors: medication, prone lying  PRECAUTIONS: None  WEIGHT BEARING RESTRICTIONS No  FALLS:  Has patient fallen in  last 6 months? No, Number of falls: N/A  LIVING ENVIRONMENT: Lives with: lives alone Lives in: House/apartment Stairs: No; N/A Has following equipment at home: None  OCCUPATION: Not currently working  PLOF: Independent and Independent with basic ADLs  PATIENT GOALS Pt wants to decrease LBP in order to improve comfort and functional mobility, especially moving from sit>stand   OBJECTIVE:   DIAGNOSTIC FINDINGS:  MRI Pending  PATIENT SURVEYS:  Modified Oswestry 62% disability   SCREENING FOR RED FLAGS: Bowel or bladder incontinence: No Spinal tumors: No Cauda equina syndrome: No Compression fracture: No Abdominal aneurysm: No  COGNITION:  Overall cognitive status: Within functional limits for tasks assessed     SENSATION:  Light touch: Appears intact  POSTURE:  Increased trunk flexion, reduced lordosis  LE MMT: Myotomes WNL bilaterally  MMT Right 06/11/2021 Left 06/11/2021  Hip flexion    Hip extension    Hip abduction    Hip adduction    Hip internal rotation    Hip external rotation    Knee flexion    Knee extension    Ankle dorsiflexion    Ankle plantarflexion    Ankle inversion    Ankle eversion     (Blank  rows = not tested)  LUMBAR SPECIAL TESTS:  Straight leg raise test: Positive and Slump test: Positive  SPINAL SEGMENTAL MOBILITY ASSESSMENT:  Decreased lumbar mobility L2-L5 through spring testing  FUNCTIONAL TESTS:  30" STS: 3 reps  GAIT: Distance walked: 48ft Assistive device utilized: None Level of assistance: Complete Independence Comments: trunk flexed, R antalgia    TODAY'S TREATMENT  Therapeutic Exercise:  Seated sciatic nerve glide x 10 POE x 60" LTR x 5  Manual Therapy: N/A   Neuromuscular re-ed: N/A   Therapeutic Activity: N/A   Modalities: N/A   Self Care: N/A   Consider / progression for next session:    PATIENT EDUCATION:  Education details: eval findings, ODI, HEP, POC Person educated: Patient Education method: Explanation, Demonstration, Tactile cues, and Handouts Education comprehension: verbalized understanding and returned demonstration   HOME EXERCISE PROGRAM: Access Code: [email or web address]  ASSESSMENT:  CLINICAL IMPRESSION: Patient is a pleasant 55 y/o M who presents to PT with reports of chronic hx of R sided lower back with referral into R LE. Physical findings are consistent with MD impression of lumbar radiculopathy, with pt demonstrating positive slump, SLR and decreased lumbar mobility. Pt shows reduced functional mobility and proximal hip strength, as evidenced by decreased rep count during 30" STS. Likewise, his ODI score shows severe disability, indicating he is operating well below his PLOF and would benefit from skilled PT services focusing on improving core endurance, LE strength, and functional mobility in order to decrease pain and improve function. PT will assess response to initial HEP and progress as able.   REHAB POTENTIAL: Good  CLINICAL DECISION MAKING: Stable/uncomplicated  EVALUATION COMPLEXITY: Low   GOALS:  SHORT TERM GOALS:  STG Name Target Date Goal status  1 Pt will be compliant and knowledgeable with 90% of  initial HEP for improved comfort and carryover Baseline:  06/26/21 INITIAL   LONG TERM GOALS:   LTG Name Target Date Goal status  1 Pt will improve Modified ODI to no less than 40% as proxy for functional improvement Baseline: 62% 2/7/20232 INITIAL  2 Pt will self report R sided LBP no greater than 3/10 at worst for improved comfort and function Baseline: 07/31/2021 INITIAL  3 Pt will improve reps in 30" STS to no less than  6 in order to improve functional mobility and decrease fall risk Baseline: 3 reps 07/31/2021 INITIAL  4 Pt will be able to squat and lift 25lb KB from floor to chest level for improved functional ability to perform ADLs Baseline: unable 07/31/2021 INITIAL   PLAN: PT FREQUENCY: 1-2x/week  PT DURATION: 8 weeks  PLANNED INTERVENTIONS: Therapeutic exercises, Therapeutic activity, Neuro Muscular re-education, Balance training, Gait training, Patient/Family education, Joint mobilization, Stair training, Electrical stimulation, Spinal mobilization, Cryotherapy, Moist heat, Taping, and Manual therapy  PLAN FOR NEXT SESSION: assess HEP response, especially extension pref; progress core endurance, LE strength, and functional mobility as able   Eloy End 06/11/2021, 4:39 PM

## 2021-06-11 NOTE — Addendum Note (Signed)
Addended by: Eloy End on: 06/11/2021 04:41 PM   Modules accepted: Orders

## 2021-06-12 ENCOUNTER — Other Ambulatory Visit: Payer: Self-pay

## 2021-06-12 ENCOUNTER — Other Ambulatory Visit: Payer: Self-pay | Admitting: *Deleted

## 2021-06-12 ENCOUNTER — Ambulatory Visit: Payer: Medicaid Other | Attending: Internal Medicine | Admitting: Internal Medicine

## 2021-06-12 ENCOUNTER — Encounter: Payer: Self-pay | Admitting: Internal Medicine

## 2021-06-12 VITALS — BP 132/82 | HR 67 | Resp 16 | Wt 234.0 lb

## 2021-06-12 DIAGNOSIS — R072 Precordial pain: Secondary | ICD-10-CM

## 2021-06-12 DIAGNOSIS — R7303 Prediabetes: Secondary | ICD-10-CM

## 2021-06-12 DIAGNOSIS — R079 Chest pain, unspecified: Secondary | ICD-10-CM

## 2021-06-12 DIAGNOSIS — Z0120 Encounter for dental examination and cleaning without abnormal findings: Secondary | ICD-10-CM

## 2021-06-12 DIAGNOSIS — I10 Essential (primary) hypertension: Secondary | ICD-10-CM

## 2021-06-12 DIAGNOSIS — R972 Elevated prostate specific antigen [PSA]: Secondary | ICD-10-CM

## 2021-06-12 DIAGNOSIS — E785 Hyperlipidemia, unspecified: Secondary | ICD-10-CM | POA: Diagnosis not present

## 2021-06-12 NOTE — Progress Notes (Signed)
Patient ID: Keith Potter, male    DOB: 1965/08/16  MRN: [id number]  CC: Back Pain (lower) and Foot Pain (right)   Subjective: Keith Potter is a 55 y.o. 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  He had complained of some chest pains that sounded worrisome so he was referred to cardiology.  He saw Dr. Wyline Mood on the sixth of this month.  Nuclear stress test did not show any signs to suggest blockages.  echo ordered. -Found to have elevated LDL on lipid profile.  Patient started on Lipitor.  He was taking and tolerating the medication but recently ran out.  Did not realize that he has refills.  He quit smoking 3 months ago.  Blood pressure elevated on last visit.  I started him on amlodipine which she is taking and tolerating but again ran out 3 days ago not realizing that he still has refills on the prescription.  Patient complained of nocturia on last visit.  I started him on Flomax.  He reports significant improvement.  Referred to urology for elevated PSA of 17.7.  He has not received a call as yet.  Being evaluated by Dr. Otelia Sergeant for chronic radicular lumbar pain.  MRI of the lumbar spine ordered and they are trying to get it approved through his insurance.  Patient wonders whether with everything going on he would qualify for disability.  Patient was also diagnosed with prediabetes based on A1c of 5.7.  He has been referred to nutritionist and that appointment is coming up the end of this month.  He has been trying to make changes in his eating habits because of this and also because of the high cholesterol.  Requests referral to dentist for routine dental  care.  Patient Active Problem List   Diagnosis Date Noted   Prediabetes 05/12/2021   Elevated PSA 05/12/2021   Former smoker 05/05/2021   Essential hypertension 05/05/2021   Closed fracture of right orbit (HCC) 01/05/2021   Fracture of alveolus of right mandible, initial encounter for closed fracture (HCC) 01/05/2021   Tobacco dependence 04/23/2019   Marijuana user 04/23/2019   Rectal bleed 04/23/2019   History of pulmonary embolism 04/23/2019   DVT (deep venous thrombosis) (HCC) 03/31/2019   Pulmonary emboli (HCC) 03/27/2019   Hyperlipidemia 01/10/2019   Healthcare maintenance 01/10/2019   Pulmonary embolism (HCC) 12/28/2018   History of DVT (deep vein thrombosis) 12/28/2018   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   Chronic pain of toe, right 06/06/2016   Deformity of toe, right 06/06/2016   Increased frequency of urination 06/09/2013   Nocturia 06/09/2013     Current Outpatient Medications on File Prior to Visit  Medication Sig Dispense Refill   amLODipine (NORVASC) 5 MG tablet Take 1 tablet (5 mg total) by mouth daily. 30 tablet 3   atorvastatin (LIPITOR) 20 MG tablet Take 1 tablet (20 mg total) by mouth daily. 30 tablet 3   cyclobenzaprine (FLEXERIL) 10 MG tablet Take 1 tablet (10 mg total) by mouth 3 (three) times daily as needed for muscle spasms. 30 tablet 1   methylPREDNISolone (MEDROL) 4 MG tablet 6 day taper to be taken as directed.  21 tablet 0   pregabalin (LYRICA) 75 MG capsule Take 1 capsule (75 mg total) by mouth 2 (two) times daily. (Patient not taking: Reported on 05/29/2021) 60 capsule 0   pregabalin (LYRICA) 75 MG capsule Take 1 capsule (75 mg total) by mouth 2 (two) times daily. 60 capsule 1   tamsulosin (FLOMAX) 0.4 MG CAPS capsule Take 1 capsule (0.4 mg total) by mouth daily. 30 capsule 3   traMADol (ULTRAM) 50 MG tablet Take 2 tablets (100 mg total) by mouth every 6 (six) hours as needed. (Patient not taking: Reported on 05/29/2021) 30 tablet 0   XARELTO  20 MG TABS tablet TAKE 1 TABLET BY MOUTH EVERY DAY WITH SUPPER 30 tablet 2   No current facility-administered medications on file prior to visit.    No Known Allergies  Social History   Socioeconomic History   Marital status: Single    Spouse name: Not on file   Number of children: Not on file   Years of education: Not on file   Highest education level: Not on file  Occupational History   Not on file  Tobacco Use   Smoking status: Former    Packs/day: 0.50    Types: Cigarettes   Smokeless tobacco: Never  Vaping Use   Vaping Use: Never used  Substance and Sexual Activity   Alcohol use: Not Currently   Drug use: Yes    Types: Marijuana   Sexual activity: Yes    Partners: Female    Birth control/protection: Condom  Other Topics Concern   Not on file  Social History Narrative   Not on file   Social Determinants of Health   Financial Resource Strain: Not on file  Food Insecurity: Not on file  Transportation Needs: Not on file  Physical Activity: Not on file  Stress: Not on file  Social Connections: Not on file  Intimate Partner Violence: Not on file    Family History  Problem Relation Age of Onset   Diabetes Mother    Diabetes Father     Past Surgical History:  Procedure Laterality Date   AMPUTATION TOE     APPENDECTOMY     HERNIA REPAIR      ROS: Review of Systems Negative except as stated above  PHYSICAL EXAM: BP 132/82 (BP Location: Right Arm, Patient Position: Sitting, Cuff Size: Large)    Pulse 67    Resp 16    Wt 234 lb (106.1 kg)    SpO2 99%    BMI 30.87 kg/m   Physical Exam  General appearance - alert, well appearing, middle-aged older African-American male and in no distress Mental status - normal mood, behavior, speech, dress, motor activity, and thought processes Neck - supple, no significant adenopathy Chest - clear to auscultation, no wheezes, rales or rhonchi, symmetric air entry Heart - normal rate, regular rhythm, normal S1, S2, no  murmurs, rubs, clicks or gallops Extremities - peripheral pulses normal, no pedal edema, no clubbing or cyanosis  Lab Results  Component Value Date   PSA1 17.7 (H) 05/07/2021     CMP Latest Ref Rng & Units 05/07/2021 04/01/2019 03/31/2019  Glucose 70 - 99 mg/dL 347(Q) 259(D) 75  BUN 6 - 24 mg/dL 22 14 10   Creatinine 0.76 - 1.27 mg/dL ) 6.38(V) 5.64(P  Sodium 134 - 144 mmol/L 140 142 142  Potassium 3.5 - 5.2 mmol/L 4.1 3.7 4.0  Chloride 96 - 106 mmol/L 105 108 106  CO2 20 - 29 mmol/L 19(L) 24  24  Calcium 8.7 - 10.2 mg/dL 9.5 9.1 9.6  Total Protein 6.0 - 8.5 g/dL 7.6 8.1(E) -  Total Bilirubin 0.0 - 1.2 mg/dL 0.8 0.8 -  Alkaline Phos 44 - 121 IU/L 125(H) 88 -  AST 0 - 40 IU/L 28 18 -  ALT 0 - 44 IU/L 33 18 -   Lipid Panel     Component Value Date/Time   CHOL 289 (H) 05/07/2021 0938   TRIG 85 05/07/2021 0938   HDL 38 (L) 05/07/2021 0938   CHOLHDL 7.6 (H) 05/07/2021 0938   CHOLHDL 5.5 12/28/2018 2042   VLDL 15 12/28/2018 2042   LDLCALC 237 (H) 05/07/2021 0938    CBC    Component Value Date/Time   WBC 7.5 05/07/2021 0938   WBC 9.2 04/01/2019 0427   RBC 5.42 05/07/2021 0938   RBC 4.75 04/01/2019 0427   HGB 17.0 05/07/2021 0938   HCT 49.6 05/07/2021 0938   PLT 166 05/07/2021 0938   MCV 92 05/07/2021 0938   MCH 31.4 05/07/2021 0938   MCH 31.4 04/01/2019 0427   MCHC 34.3 05/07/2021 0938   MCHC 33.5 04/01/2019 0427   RDW 12.7 05/07/2021 0938   LYMPHSABS 3.0 03/31/2019 1720   MONOABS 0.7 03/31/2019 1720   EOSABS 0.4 03/31/2019 1720   BASOSABS 0.1 03/31/2019 1720    ASSESSMENT AND PLAN: 1. Essential hypertension Close to goal.  Went over with the patient how the refill process works.  Advised him that he still has refill on the amlodipine and he should stop at the pharmacy today to get refills.  2. Prediabetes 3. Hyperlipidemia, unspecified hyperlipidemia type Dietary counseling given.  Advised to eliminate sugary drinks from the diet, cut back on portion sizes of  white carbohydrates, eat more lean white meat instead of red meat and incorporate fresh fruits and vegetables into the diet. Continue atorvastatin.  4. Chest pain in adult Patient has had negative nuclear stress test.  Awaiting appointment for echo.  5. Elevated PSA Patient has been referred to [facility] urology but has not received a call as yet.  I gave him the phone number and recommend that he calls them and let them know that we have referred him for elevated prostate level so that he can be scheduled.  6. Encounter for routine dental examination - Ambulatory referral to Dentistry    Patient was given the opportunity to ask questions.  Patient verbalized understanding of the plan and was able to repeat key elements of the plan.   No orders of the defined types were placed in this encounter.    Requested Prescriptions    No prescriptions requested or ordered in this encounter    No follow-ups on file.  Jonah Blue, MD, FACP

## 2021-06-12 NOTE — Patient Instructions (Addendum)
Call [facility] at [phone] and let them know that we have referred you for evaluation for elevated PSA level.  Healthy Eating Following a healthy eating pattern may help you to achieve and maintain a healthy body weight, reduce the risk of chronic disease, and live a long and productive life. It is important to follow a healthy eating pattern at an appropriate calorie level for your body. Your nutritional needs should be met primarily through food by choosing a variety of nutrient-rich foods. What are tips for following this plan? Reading food labels Read labels and choose the following: Reduced or low sodium. Juices with 100% fruit juice. Foods with low saturated fats and high polyunsaturated and monounsaturated fats. Foods with whole grains, such as whole wheat, cracked wheat, brown rice, and wild rice. Whole grains that are fortified with folic acid. This is recommended for women who are pregnant or who want to become pregnant. Read labels and avoid the following: Foods with a lot of added sugars. These include foods that contain brown sugar, corn sweetener, corn syrup, dextrose, fructose, glucose, high-fructose corn syrup, honey, invert sugar, lactose, malt syrup, maltose, molasses, raw sugar, sucrose, trehalose, or turbinado sugar. Do not eat more than the following amounts of added sugar per day: 6 teaspoons (25 g) for women. 9 teaspoons (38 g) for men. Foods that contain processed or refined starches and grains. Refined grain products, such as white flour, degermed cornmeal, white bread, and white rice. Shopping Choose nutrient-rich snacks, such as vegetables, whole fruits, and nuts. Avoid high-calorie and high-sugar snacks, such as potato chips, fruit snacks, and candy. Use oil-based dressings and spreads on foods instead of solid fats such as butter, stick margarine, or cream cheese. Limit pre-made sauces, mixes, and "instant" products such as flavored rice, instant noodles,  and ready-made pasta. Try more plant-protein sources, such as tofu, tempeh, black beans, edamame, lentils, nuts, and seeds. Explore eating plans such as the Mediterranean diet or vegetarian diet. Cooking Use oil to saut or stir-fry foods instead of solid fats such as butter, stick margarine, or lard. Try baking, boiling, grilling, or broiling instead of frying. Remove the fatty part of meats before cooking. Steam vegetables in water or broth. Meal planning  At meals, imagine dividing your plate into fourths: One-half of your plate is fruits and vegetables. One-fourth of your plate is whole grains. One-fourth of your plate is protein, especially lean meats, poultry, eggs, tofu, beans, or nuts. Include low-fat dairy as part of your daily diet. Lifestyle Choose healthy options in all settings, including home, work, school, restaurants, or stores. Prepare your food safely: Wash your hands after handling raw meats. Keep food preparation surfaces clean by regularly washing with hot, soapy water. Keep raw meats separate from ready-to-eat foods, such as fruits and vegetables. Cook seafood, meat, poultry, and eggs to the recommended internal temperature. Store foods at safe temperatures. In general: Keep cold foods at 79F (4.4C) or below. Keep hot foods at 179F (60C) or above. Keep your freezer at [facility] (-17.8C) or below. Foods are no longer safe to eat when they have been between the temperatures of 40-179F (4.4-60C) for more than 2 hours. What foods should I eat? Fruits Aim to eat 2 cup-equivalents of fresh, canned (in natural juice), or frozen fruits each day. Examples of 1 cup-equivalent of fruit include 1 small apple, 8 large strawberries, 1 cup canned fruit,  cup dried fruit, or 1 cup 100% juice. Vegetables Aim to eat 2-3 cup-equivalents of fresh and frozen  vegetables each day, including different varieties and colors. Examples of 1 cup-equivalent of vegetables include 2 medium  carrots, 2 cups raw, leafy greens, 1 cup chopped vegetable (raw or cooked), or 1 medium baked potato. Grains Aim to eat 6 ounce-equivalents of whole grains each day. Examples of 1 ounce-equivalent of grains include 1 slice of bread, 1 cup ready-to-eat cereal, 3 cups popcorn, or  cup cooked rice, pasta, or cereal. Meats and other proteins Aim to eat 5-6 ounce-equivalents of protein each day. Examples of 1 ounce-equivalent of protein include 1 egg, 1/2 cup nuts or seeds, or 1 tablespoon (16 g) peanut butter. A cut of meat or fish that is the size of a deck of cards is about 3-4 ounce-equivalents. Of the protein you eat each week, try to have at least 8 ounces come from seafood. This includes salmon, trout, herring, and anchovies. Dairy Aim to eat 3 cup-equivalents of fat-free or low-fat dairy each day. Examples of 1 cup-equivalent of dairy include 1 cup (240 mL) milk, 8 ounces (250 g) yogurt, 1 ounces (44 g) natural cheese, or 1 cup (240 mL) fortified soy milk. Fats and oils Aim for about 5 teaspoons (21 g) per day. Choose monounsaturated fats, such as canola and olive oils, avocados, peanut butter, and most nuts, or polyunsaturated fats, such as sunflower, corn, and soybean oils, walnuts, pine nuts, sesame seeds, sunflower seeds, and flaxseed. Beverages Aim for six 8-oz glasses of water per day. Limit coffee to three to five 8-oz cups per day. Limit caffeinated beverages that have added calories, such as soda and energy drinks. Limit alcohol intake to no more than 1 drink a day for nonpregnant women and 2 drinks a day for men. One drink equals 12 oz of beer (355 mL), 5 oz of wine (148 mL), or 1 oz of hard liquor (44 mL). Seasoning and other foods Avoid adding excess amounts of salt to your foods. Try flavoring foods with herbs and spices instead of salt. Avoid adding sugar to foods. Try using oil-based dressings, sauces, and spreads instead of solid fats. This information is based on general [place]  nutrition guidelines. For more information, visit [employer]. Exact amounts may vary based on your nutrition needs. Summary A healthy eating plan may help you to maintain a healthy weight, reduce the risk of chronic diseases, and stay active throughout your life. Plan your meals. Make sure you eat the right portions of a variety of nutrient-rich foods. Try baking, boiling, grilling, or broiling instead of frying. Choose healthy options in all settings, including home, work, school, restaurants, or stores. This information is not intended to replace advice given to you by your health care provider. Make sure you discuss any questions you have with your health care provider. Document Revised: 02/06/2021 Document Reviewed: 02/06/2021 Elsevier Patient Education  [place].

## 2021-06-13 ENCOUNTER — Ambulatory Visit: Payer: Medicaid Other | Admitting: Physical Therapy

## 2021-06-13 ENCOUNTER — Other Ambulatory Visit: Payer: Self-pay

## 2021-06-13 ENCOUNTER — Encounter: Payer: Self-pay | Admitting: Physical Therapy

## 2021-06-13 ENCOUNTER — Encounter: Payer: Medicaid Other | Admitting: Podiatry

## 2021-06-13 DIAGNOSIS — M6281 Muscle weakness (generalized): Secondary | ICD-10-CM | POA: Diagnosis not present

## 2021-06-13 DIAGNOSIS — G8929 Other chronic pain: Secondary | ICD-10-CM

## 2021-06-13 DIAGNOSIS — R262 Difficulty in walking, not elsewhere classified: Secondary | ICD-10-CM

## 2021-06-14 ENCOUNTER — Telehealth: Payer: Self-pay | Admitting: Internal Medicine

## 2021-06-14 NOTE — Telephone Encounter (Signed)
-----   Message from Dionne Bucy sent at 06/13/2021  7:11 PM EST ----- Regarding: RE: Urology referral. Per [facility]  Consult date 06/29/2021 08:45 AM Seen By Marcine Matar ----- Message ----- From: Marcine Matar, MD Sent: 06/12/2021   5:38 PM EST To: Dionne Bucy Subject: Urology referral.                              Referred to [facility] for elevated PSA.  Patient states he has not received a call as yet.

## 2021-06-21 ENCOUNTER — Ambulatory Visit: Payer: Medicaid Other | Admitting: Physical Therapy

## 2021-06-21 ENCOUNTER — Encounter: Payer: Self-pay | Admitting: Physical Therapy

## 2021-06-21 ENCOUNTER — Other Ambulatory Visit: Payer: Self-pay

## 2021-06-21 DIAGNOSIS — M6281 Muscle weakness (generalized): Secondary | ICD-10-CM | POA: Diagnosis not present

## 2021-06-21 DIAGNOSIS — M5441 Lumbago with sciatica, right side: Secondary | ICD-10-CM

## 2021-06-21 DIAGNOSIS — R262 Difficulty in walking, not elsewhere classified: Secondary | ICD-10-CM

## 2021-06-21 NOTE — Therapy (Signed)
OUTPATIENT PHYSICAL THERAPY TREATMENT NOTE   Patient Name: Keith Potter MRN: [id number] DOB:[birth year], 56 y.o., male Today's Date: 06/21/2021  PCP: Marcine Matar, MD REFERRING PROVIDER: Kerrin Champagne, MD   PT End of Session - 06/21/21 0910     Visit Number 3    Number of Visits 17    Date for PT Re-Evaluation 07/31/21    Authorization Type [employer]    PT Start Time 0915    PT Stop Time 0958    PT Time Calculation (min) 43 min    Activity Tolerance Patient tolerated treatment well    Behavior During Therapy [facility] for tasks assessed/performed             Past Medical History:  Diagnosis Date   DVT (deep venous thrombosis) (HCC)    Pulmonary emboli (HCC)    Past Surgical History:  Procedure Laterality Date   AMPUTATION TOE     APPENDECTOMY     HERNIA REPAIR     Patient Active Problem List   Diagnosis Date Noted   Prediabetes 05/12/2021   Elevated PSA 05/12/2021   Former smoker 05/05/2021   Essential hypertension 05/05/2021   Closed fracture of right orbit (HCC) 01/05/2021   Fracture of alveolus of right mandible, initial encounter for closed fracture (HCC) 01/05/2021   Tobacco dependence 04/23/2019   Marijuana user 04/23/2019   Rectal bleed 04/23/2019   History of pulmonary embolism 04/23/2019   DVT (deep venous thrombosis) (HCC) 03/31/2019   Pulmonary emboli (HCC) 03/27/2019   Hyperlipidemia 01/10/2019   Healthcare maintenance 01/10/2019   Pulmonary embolism (HCC) 12/28/2018   History of DVT (deep vein thrombosis) 12/28/2018   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   Chronic pain of toe, right 06/06/2016   Deformity of toe, right 06/06/2016   Increased frequency of urination 06/09/2013   Nocturia 06/09/2013    REFERRING DIAG: REFERRING DIAG: M54.16 (ICD-10-CM) - Radiculopathy, lumbar region M54.50 (ICD-10-CM) - Low back pain, unspecified back pain laterality, unspecified chronicity, unspecified whether sciatica present  THERAPY DIAG:   Muscle weakness (generalized)  Chronic right-sided low back pain with right-sided sciatica  Difficulty in walking, not elsewhere classified  PERTINENT HISTORY:  Chronic hx of R sided LBP with referral into posterior R LE  PRECAUTIONS: None   WEIGHT BEARING RESTRICTIONS No  SUBJECTIVE:   Pt reports that his back pain is slightly better.  He is now able to tie his shoes.   PAIN:  Are you having pain? Yes NPRS scale: 6/10 Pain location: R side of lower back; R LE PAIN TYPE: aching and dull Pain description: constant  Aggravating factors: walking, transfer Relieving factors: medication, prone lying    OBJECTIVE:  LE MMT: Myotomes WNL bilaterally   MMT Right 06/05/2021 Left 06/05/2021  Hip flexion      Hip extension      Hip abduction      Hip adduction      Hip internal rotation      Hip external rotation      Knee flexion      Knee extension      Ankle dorsiflexion      Ankle plantarflexion      Ankle inversion      Ankle eversion       (Blank rows = not tested)   TREATMENT 12/29  Therapeutic Exercise:   nu-step L5 50m while taking subjective and planning session with patient Seated sciatic nerve glide x 20 LTR x 20 Supine clam -  GTB - 2x20 - limited range Hip adduction ball squeeze - 2x20 Supine bridge with legs on bolster - very small arc - 2x20 Pelvic tilting with feet on bolster - 2x20 Abdominal contraction with bil shoulder ext - 2x20 GTB   TREATMENT 12/21  Therapeutic Exercise:    nu-step L5 29m while taking subjective and planning session with patient Seated sciatic nerve glide x 10 LTR x 20 Supine clam - RTB - 3x10 Hip adduction ball squeeze - 3x10 Supine bridge with legs on bolster - very small arc - 3x10 Pelvic tilting with feet on bolster - 3x10 Abdominal contraction with bil shoulder ext - 3x10 GTB Tried manual traction - not tolerated well  Patient Education: - HEP was updated and reissued to patient; pt educated on HEP, was  provided handout, and verbally confirmed understanding of exercises.   HOME EXERCISE PROGRAM: Access Code: [email or web address] URL: [email or web address] Date: 06/21/2021 Prepared by: Alphonzo Severance  Exercises Seated Sciatic Tensioner - 1-2 x daily - 7 x weekly - 2 sets - 15 reps Supine Lower Trunk Rotation - 1-2 x daily - 7 x weekly - 2 sets - 10 reps Seated Hip Abduction with Resistance - 1 x daily - 7 x weekly - 3 sets - 10 reps Seated Isometric Hip Adduction with Pelvic Floor Contraction - 1 x daily - 7 x weekly - 3 sets - 10 reps Supine Bridge - 1 x daily - 7 x weekly - 3 sets - 10 reps     ASSESSMENT:   CLINICAL IMPRESSION: Axil is progressing fair with therapy.  Pt reports no increase in baseline pain following therapy.  Today we concentrated on core strengthening and hip strengthening.  Pt continues to be limited by pain, but is reporting some functional improvement and slight decrease in baseline pain.  Pt will continue to benefit from skilled physical therapy to address remaining deficits and achieve listed goals.  Continue per POC.     GOALS:   SHORT TERM GOALS:   STG Name Target Date Goal status  1 Pt will be compliant and knowledgeable with 90% of initial HEP for improved comfort and carryover Baseline:  06/26/21 INITIAL    LONG TERM GOALS:    LTG Name Target Date Goal status  1 Pt will improve Modified ODI to no less than 40% as proxy for functional improvement Baseline: 62% 2/7/20232 INITIAL  2 Pt will self report R sided LBP no greater than 3/10 at worst for improved comfort and function Baseline: 07/31/2021 INITIAL  3 Pt will improve reps in 30" STS to no less than 6 in order to improve functional mobility and decrease fall risk Baseline: 3 reps 07/31/2021 INITIAL  4 Pt will be able to squat and lift 25lb KB from floor to chest level for improved functional ability to perform ADLs Baseline: unable 07/31/2021 INITIAL    PLAN: PT FREQUENCY: 1-2x/week   PT  DURATION: 8 weeks   PLANNED INTERVENTIONS: Therapeutic exercises, Therapeutic activity, Neuro Muscular re-education, Balance training, Gait training, Patient/Family education, Joint mobilization, Stair training, Electrical stimulation, Spinal mobilization, Cryotherapy, Moist heat, Taping, and Manual therapy   PLAN FOR NEXT SESSION: assess HEP response, especially extension pref; progress core endurance, LE strength, and functional mobility as able   Fredderick Phenix 06/21/2021, 9:11 AM

## 2021-06-22 ENCOUNTER — Ambulatory Visit: Payer: Medicaid Other | Admitting: Registered"

## 2021-06-27 ENCOUNTER — Other Ambulatory Visit: Payer: Self-pay

## 2021-06-27 ENCOUNTER — Ambulatory Visit: Payer: Medicaid Other | Attending: Specialist

## 2021-06-27 DIAGNOSIS — R262 Difficulty in walking, not elsewhere classified: Secondary | ICD-10-CM | POA: Diagnosis present

## 2021-06-27 DIAGNOSIS — G8929 Other chronic pain: Secondary | ICD-10-CM | POA: Diagnosis present

## 2021-06-27 DIAGNOSIS — M5441 Lumbago with sciatica, right side: Secondary | ICD-10-CM | POA: Diagnosis present

## 2021-06-27 DIAGNOSIS — M6281 Muscle weakness (generalized): Secondary | ICD-10-CM | POA: Diagnosis present

## 2021-06-27 NOTE — Therapy (Signed)
OUTPATIENT PHYSICAL THERAPY TREATMENT NOTE   Patient Name: Keith Potter MRN: [id number] DOB:1965-07-29, 56 y.o., male Today's Date: 06/27/2021  PCP: Marcine Matar, MD REFERRING PROVIDER: Kerrin Champagne, MD   PT End of Session - 06/27/21 0824     Visit Number 4    Number of Visits 17    Date for PT Re-Evaluation 07/31/21    Authorization Type [employer]    PT Start Time 0828    PT Stop Time 0909    PT Time Calculation (min) 41 min    Activity Tolerance Patient tolerated treatment well    Behavior During Therapy [facility] for tasks assessed/performed             Past Medical History:  Diagnosis Date   DVT (deep venous thrombosis) (HCC)    Pulmonary emboli (HCC)    Past Surgical History:  Procedure Laterality Date   AMPUTATION TOE     APPENDECTOMY     HERNIA REPAIR     Patient Active Problem List   Diagnosis Date Noted   Prediabetes 05/12/2021   Elevated PSA 05/12/2021   Former smoker 05/05/2021   Essential hypertension 05/05/2021   Closed fracture of right orbit (HCC) 01/05/2021   Fracture of alveolus of right mandible, initial encounter for closed fracture (HCC) 01/05/2021   Tobacco dependence 04/23/2019   Marijuana user 04/23/2019   Rectal bleed 04/23/2019   History of pulmonary embolism 04/23/2019   DVT (deep venous thrombosis) (HCC) 03/31/2019   Pulmonary emboli (HCC) 03/27/2019   Hyperlipidemia 01/10/2019   Healthcare maintenance 01/10/2019   Pulmonary embolism (HCC) 12/28/2018   History of DVT (deep vein thrombosis) 12/28/2018   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   Chronic pain of toe, right 06/06/2016   Deformity of toe, right 06/06/2016   Increased frequency of urination 06/09/2013   Nocturia 06/09/2013    REFERRING DIAG: REFERRING DIAG: M54.16 (ICD-10-CM) - Radiculopathy, lumbar region M54.50 (ICD-10-CM) - Low back pain, unspecified back pain laterality, unspecified chronicity, unspecified whether sciatica present  THERAPY DIAG:   Muscle weakness (generalized)  Chronic right-sided low back pain with right-sided sciatica  Difficulty in walking, not elsewhere classified  PERTINENT HISTORY:  Chronic hx of R sided LBP with referral into posterior R LE  PRECAUTIONS: None   WEIGHT BEARING RESTRICTIONS No  SUBJECTIVE:  Pt presents to PT with reports of continued R sided lower back pain and discomfort. Pt has been fairly compliant with HEP with on adverse effect. Pt is ready to begin PT treatment at this time.    PAIN:  Are you having pain? Yes NPRS scale: 7/10 Pain location: R side of lower back; R LE PAIN TYPE: aching and dull Pain description: constant  Aggravating factors: walking, transfer Relieving factors: medication, prone lying    OBJECTIVE:  LE MMT: Myotomes WNL bilaterally   MMT Right 06/05/2021 Left 06/05/2021  Hip flexion      Hip extension      Hip abduction      Hip adduction      Hip internal rotation      Hip external rotation      Knee flexion      Knee extension      Ankle dorsiflexion      Ankle plantarflexion      Ankle inversion      Ankle eversion       (Blank rows = not tested)   TREATMENT 06/27/21  Therapeutic Exercise:  nu-step L5 70m while taking subjective  and planning session with patient Seated sciatic nerve glide 2x15 R LTR x 20 Supine clam - GTB - 2x20 - limited range Supine march 2x20 Hip adduction ball squeeze - 2x20 - 5" Leg press 3x8 45lb Supine bridge with legs on bolster - very small arc - 2x20 Pelvic tilting with feet on bolster - 2x20 (NT) Abdominal contraction with bil shoulder ext - 3x15 BTB  TREATMENT 12/29  Therapeutic Exercise:  nu-step L5 6m while taking subjective and planning session with patient Seated sciatic nerve glide x 20 LTR x 20 Supine clam - GTB - 2x20 - limited range Hip adduction ball squeeze - 2x20 Supine bridge with legs on bolster - very small arc - 2x20 Pelvic tilting with feet on bolster - 2x20 Abdominal contraction  with bil shoulder ext - 2x20 GTB   TREATMENT 12/21  Therapeutic Exercise:   nu-step L5 20m while taking subjective and planning session with patient Seated sciatic nerve glide x 10 LTR x 20 Supine clam - RTB - 3x10 Hip adduction ball squeeze - 3x10 Supine bridge with legs on bolster - very small arc - 3x10 Pelvic tilting with feet on bolster - 3x10 Abdominal contraction with bil shoulder ext - 3x10 GTB Tried manual traction - not tolerated well  Patient Education: - HEP was updated and reissued to patient; pt educated on HEP, was provided handout, and verbally confirmed understanding of exercises.   HOME EXERCISE PROGRAM: Access Code: [email or web address] URL: [email or web address] Date: 06/21/2021 Prepared by: Alphonzo Severance  Exercises Seated Sciatic Tensioner - 1-2 x daily - 7 x weekly - 2 sets - 15 reps Supine Lower Trunk Rotation - 1-2 x daily - 7 x weekly - 2 sets - 10 reps Seated Hip Abduction with Resistance - 1 x daily - 7 x weekly - 3 sets - 10 reps Seated Isometric Hip Adduction with Pelvic Floor Contraction - 1 x daily - 7 x weekly - 3 sets - 10 reps Supine Bridge - 1 x daily - 7 x weekly - 3 sets - 10 reps     ASSESSMENT:   CLINICAL IMPRESSION: Pt was able to complete all prescribed exercises with no adverse effect. Therapy today focused on improving core and proximal hip strength in order to decrease pain and improving functional mobility. He continues to benefit from skilled PT services in order to improve comfort and ability. Will continue to progress exercises as tolerated per POC.      GOALS:   SHORT TERM GOALS:   STG Name Target Date Goal status  1 Pt will be compliant and knowledgeable with 90% of initial HEP for improved comfort and carryover Baseline:  06/26/21 INITIAL    LONG TERM GOALS:    LTG Name Target Date Goal status  1 Pt will improve Modified ODI to no less than 40% as proxy for functional improvement Baseline: 62% 2/7/20232 INITIAL   2 Pt will self report R sided LBP no greater than 3/10 at worst for improved comfort and function Baseline: 07/31/2021 INITIAL  3 Pt will improve reps in 30" STS to no less than 6 in order to improve functional mobility and decrease fall risk Baseline: 3 reps 07/31/2021 INITIAL  4 Pt will be able to squat and lift 25lb KB from floor to chest level for improved functional ability to perform ADLs Baseline: unable 07/31/2021 INITIAL    PLAN: PT FREQUENCY: 1-2x/week   PT DURATION: 8 weeks   PLANNED INTERVENTIONS: Therapeutic exercises, Therapeutic activity, Neuro Muscular re-education, Balance training, Gait  training, Patient/Family education, Joint mobilization, Stair training, Electrical stimulation, Spinal mobilization, Cryotherapy, Moist heat, Taping, and Manual therapy   PLAN FOR NEXT SESSION: assess HEP response, especially extension pref; progress core endurance, LE strength, and functional mobility as able   Eloy EndDavid C Dyami Umbach 06/27/2021, 9:15 AM

## 2021-07-02 ENCOUNTER — Telehealth: Payer: Self-pay | Admitting: Internal Medicine

## 2021-07-02 ENCOUNTER — Encounter: Payer: Self-pay | Admitting: Internal Medicine

## 2021-07-02 ENCOUNTER — Other Ambulatory Visit: Payer: Self-pay

## 2021-07-02 ENCOUNTER — Ambulatory Visit (HOSPITAL_COMMUNITY): Payer: Medicaid Other

## 2021-07-02 ENCOUNTER — Encounter: Payer: Medicaid Other | Admitting: Podiatry

## 2021-07-02 ENCOUNTER — Ambulatory Visit (HOSPITAL_COMMUNITY): Payer: Medicaid Other | Attending: Cardiovascular Disease

## 2021-07-02 DIAGNOSIS — R072 Precordial pain: Secondary | ICD-10-CM | POA: Insufficient documentation

## 2021-07-02 LAB — ECHOCARDIOGRAM COMPLETE
Area-P 1/2: 3.76 cm2
S' Lateral: 3 cm

## 2021-07-02 MED ORDER — PERFLUTREN LIPID MICROSPHERE
1.0000 mL | INTRAVENOUS | Status: AC | PRN
  Administered 2021-07-02: 3 mL via INTRAVENOUS

## 2021-07-02 NOTE — Progress Notes (Signed)
Keith Potter has low risk of an adverse cardiac event with any surgical procedure. His echo showed normal LV function, only mild MR. His nuclear stress did not show new ischemia.

## 2021-07-02 NOTE — Telephone Encounter (Signed)
Keith Potter has low risk for an adverse cardiac event for any procedure. His echo only showed mild Keith. His stress test was normal and did not show evidence of coronary disease.

## 2021-07-04 ENCOUNTER — Ambulatory Visit: Payer: Medicaid Other

## 2021-07-04 ENCOUNTER — Other Ambulatory Visit: Payer: Self-pay

## 2021-07-04 DIAGNOSIS — R262 Difficulty in walking, not elsewhere classified: Secondary | ICD-10-CM

## 2021-07-04 DIAGNOSIS — M6281 Muscle weakness (generalized): Secondary | ICD-10-CM

## 2021-07-04 DIAGNOSIS — G8929 Other chronic pain: Secondary | ICD-10-CM

## 2021-07-04 NOTE — Therapy (Signed)
OUTPATIENT PHYSICAL THERAPY TREATMENT NOTE   Patient Name: Keith Potter MRN: [id number] DOB:1966-05-30, 56 y.o., male Today's Date: 07/04/2021  PCP: Ladell Pier, MD REFERRING PROVIDER: Jessy Oto, MD   PT End of Session - 07/04/21 0906     Visit Number 5    Number of Visits 17    Date for PT Re-Evaluation 07/31/21    Authorization Type [employer]    PT Start Time 0915    PT Stop Time 0956    PT Time Calculation (min) 41 min    Activity Tolerance Patient tolerated treatment well    Behavior During Therapy [facility] for tasks assessed/performed             Past Medical History:  Diagnosis Date   DVT (deep venous thrombosis) ([place])    Pulmonary emboli ([place])    Past Surgical History:  Procedure Laterality Date   AMPUTATION TOE     APPENDECTOMY     HERNIA REPAIR     Patient Active Problem List   Diagnosis Date Noted   Prediabetes 05/12/2021   Elevated PSA 05/12/2021   Former smoker 05/05/2021   Essential hypertension 05/05/2021   Closed fracture of right orbit ([place]) 01/05/2021   Fracture of alveolus of right mandible, initial encounter for closed fracture ([place]) 01/05/2021   Tobacco dependence 04/23/2019   Marijuana user 04/23/2019   Rectal bleed 04/23/2019   History of pulmonary embolism 04/23/2019   DVT (deep venous thrombosis) ([place]) 03/31/2019   Pulmonary emboli ([place]) 03/27/2019   Hyperlipidemia 01/10/2019   Healthcare maintenance 01/10/2019   Pulmonary embolism ([place]) 12/28/2018   History of DVT (deep vein thrombosis) 12/28/2018   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   Chronic pain of toe, right 06/06/2016   Deformity of toe, right 06/06/2016   Increased frequency of urination 06/09/2013   Nocturia 06/09/2013    REFERRING DIAG: REFERRING DIAG: M54.16 (ICD-10-CM) - Radiculopathy, lumbar region M54.50 (ICD-10-CM) - Low back pain, unspecified back pain laterality, unspecified chronicity, unspecified whether sciatica present  THERAPY DIAG:   Muscle weakness (generalized)  Chronic right-sided low back pain with right-sided sciatica  Difficulty in walking, not elsewhere classified  PERTINENT HISTORY:  Chronic hx of R sided LBP with referral into posterior R LE  PRECAUTIONS: None   WEIGHT BEARING RESTRICTIONS No  SUBJECTIVE:  Pt presents to PT with continued pain and discomfort in lower back. States he has been compliant with HEP which increases pain, as well as walking. Pt is ready to begin PT treatment at this time.    Pain: Are you having pain? Yes Numerical Pain Scale: 7/10 Pain Location: lower back  OBJECTIVE: Outcomes:  ODI 70% disability 30"STS: 3 reps  Interventions: Therapeutic Exercise:  Nu-step L5 51mwhile taking subjective and planning session with patient Seated sciatic nerve glide x 20 R LTR x 20 Supine clam - BTB - 2x20 - limited range Supine march 2x20 Hip adduction ball squeeze - 2x20 - 5" (NT) Leg press 3x8 55lb Supine bridge with legs on bolster - very small arc - 3x10 Pelvic tilting with feet on bolster - 2x20 (NT) Abdominal contraction with bil shoulder ext - 3x15 BTB Therapeutic Activity: Assessment of tests/measures, goals, and outcomes   Patient Education: HEP was updated and reissued to patient; pt educated on HEP, was provided handout, and verbally confirmed understanding of exercises.   HOME EXERCISE PROGRAM: Access Code: [email or web address]   ASSESSMENT: Pt was able to complete prescribed exercises and responded to treatment fair.  To this point, he has not progressed very much with therapy, with continued deficits in subjective functioning and mobility. Therapy continued today to focus on improving core and proximal hip muscle strength. PT will continue per POC and continue to assess progress as indicated.    GOALS:   SHORT TERM GOALS:   STG Name Target Date Goal status  1 Pt will be compliant and knowledgeable with 90% of initial HEP for improved comfort and carryover Baseline:   06/26/21 MET    LONG TERM GOALS:    LTG Name Target Date Goal status  1 Pt will improve Modified ODI to no less than 40% as proxy for functional improvement Baseline: 62%; 70% on 1/11 2/7/20232 ON GOING  2 Pt will self report R sided LBP no greater than 3/10 at worst for improved comfort and function Baseline: 07/31/2021 ON GOING  3 Pt will improve reps in 30" STS to no less than 6 in order to improve functional mobility and decrease fall risk Baseline: 3 reps; 3 reps on 1/11 07/31/2021 ON GOING  4 Pt will be able to squat and lift 25lb KB from floor to chest level for improved functional ability to perform ADLs Baseline: unable 07/31/2021 ON GOING    PLAN: PT FREQUENCY: 1-2x/week   PT DURATION: 8 weeks   PLANNED INTERVENTIONS: Therapeutic exercises, Therapeutic activity, Neuro Muscular re-education, Balance training, Gait training, Patient/Family education, Joint mobilization, Stair training, Electrical stimulation, Spinal mobilization, Cryotherapy, Moist heat, Taping, and Manual therapy   PLAN FOR NEXT SESSION: assess HEP response, especially extension pref; progress core endurance, LE strength, and functional mobility as able   Ward Chatters 07/04/2021, 12:22 PM

## 2021-07-06 NOTE — Telephone Encounter (Signed)
Thank you :)

## 2021-07-11 ENCOUNTER — Encounter: Payer: Self-pay | Admitting: Internal Medicine

## 2021-07-11 NOTE — Progress Notes (Signed)
Patient seen 06/29/2021 by [facility] Dr. Marcine Matar for elevated PSA.  Plan is to recheck the PSA and if still elevated he will proceed with ultrasound and biopsy.  Patient started on Viagra for erectile dysfunction.

## 2021-07-13 ENCOUNTER — Ambulatory Visit: Payer: Medicaid Other | Admitting: Dietician

## 2021-07-17 ENCOUNTER — Ambulatory Visit: Payer: Medicaid Other

## 2021-07-17 ENCOUNTER — Other Ambulatory Visit: Payer: Self-pay

## 2021-07-17 ENCOUNTER — Encounter: Payer: Medicaid Other | Attending: Internal Medicine | Admitting: Dietician

## 2021-07-17 ENCOUNTER — Encounter: Payer: Self-pay | Admitting: Dietician

## 2021-07-17 DIAGNOSIS — R7303 Prediabetes: Secondary | ICD-10-CM | POA: Insufficient documentation

## 2021-07-17 NOTE — Progress Notes (Signed)
Medical Nutrition Therapy  Appointment Start time:  1100  Appointment End time:  1150  Primary concerns today: Patient would like to learn how to eat healthy on a budget  Referral diagnosis: Prediabetes Preferred learning style: no preference indicated Learning readiness: ready   NUTRITION ASSESSMENT   Anthropometrics  73" 239 lbs UBW 225 lbs about 03/2021.  States that he has been eating too much.   Clinical Medical Hx: prediabetes, HTN, PE, DVT Medications: see list to include vitamin C and MVI Labs: A1C 5.7% 05/07/2021 Notable Signs/Symptoms: none  Lifestyle & Dietary Hx Patient lives alone.  He states that he has food insecurity at times.  He is currently working on disability. He does not have a car and uses transpiration  Sleep: 8-10  "too much on muscle relaxes" Stress / self-care: mild Current average weekly physical activity: PT twice per week, walks to the store.  24-Hr Dietary Recall First Meal: leftovers Snack: none Second Meal: sandwich Snack: none Third Meal: spaghetti with jarred meat sauce OR cornbread, milk, [place] sandwich, ice cream Snack: none Beverages: coolade, occasional water, 1 Energy drink per day      NUTRITION DIAGNOSIS  NB-1.1 Food and nutrition-related knowledge deficit As related to balance of carbohydrate, protein, and fat.  As evidenced by diet hx and patient report.   NUTRITION INTERVENTION  Nutrition education (E-1) on the following topics:  Benefits of weight loss Benefits of exercise (consistently) and as tolerated.  Discussed barriers to exercise Beverage choices and recommendation to change to water Overall composition of his diet with suggestions to alter by adding more vegetables, beans rather than meat at times as these are more affordable protein. Sources of saturated fat and tips to decrease  Handouts Provided Include  My plate Label reading Meal plan card  Learning Style & Readiness for Change Teaching method  utilized: Visual & Auditory  Demonstrated degree of understanding via: Teach Back  Barriers to learning/adherence to lifestyle change: finances  Goals Established by Pt Continue Physical  Therapy Slowly add walking throughout your day Rethink what you drink - water instead of coolade Buy lean meat Remove skin from chicken and trim meat. Bake rather than fry Low fat dairy Desserts less often and watch portion Less sugar is less craving for sugar.  MONITORING & EVALUATION Dietary intake, weekly physical activity, and label reading prn.  Next Steps  Patient is to call for questions.

## 2021-07-17 NOTE — Patient Instructions (Signed)
Continue Physical  Therapy Slowly add walking throughout your day Rethink what you drink - water instead of coolade Buy lean meat Remove skin from chicken and trim meat. Bake rather than fry Low fat dairy Desserts less often and watch portion Less sugar is less craving for sugar.

## 2021-07-19 ENCOUNTER — Ambulatory Visit: Payer: Medicaid Other

## 2021-07-19 ENCOUNTER — Other Ambulatory Visit: Payer: Self-pay

## 2021-07-19 DIAGNOSIS — M6281 Muscle weakness (generalized): Secondary | ICD-10-CM | POA: Diagnosis not present

## 2021-07-19 DIAGNOSIS — R262 Difficulty in walking, not elsewhere classified: Secondary | ICD-10-CM

## 2021-07-19 DIAGNOSIS — G8929 Other chronic pain: Secondary | ICD-10-CM

## 2021-07-19 NOTE — Therapy (Signed)
OUTPATIENT PHYSICAL THERAPY TREATMENT NOTE   Patient Name: Keith Potter MRN: [id number] DOB:[birth year], 56 y.o., male Today's Date: 07/19/2021  PCP: Ladell Pier, MD REFERRING PROVIDER: Jessy Oto, MD   PT End of Session - 07/19/21 1001     Visit Number 6    Number of Visits 17    Date for PT Re-Evaluation 07/31/21    Authorization Type [employer]    PT Start Time 1001    PT Stop Time 2263    PT Time Calculation (min) 40 min    Activity Tolerance Patient tolerated treatment well    Behavior During Therapy [facility] for tasks assessed/performed             Past Medical History:  Diagnosis Date   DVT (deep venous thrombosis) ([place])    Hypertension    Prediabetes    Pulmonary emboli ([place])    Past Surgical History:  Procedure Laterality Date   AMPUTATION TOE     APPENDECTOMY     HERNIA REPAIR     Patient Active Problem List   Diagnosis Date Noted   Prediabetes 05/12/2021   Elevated PSA 05/12/2021   Former smoker 05/05/2021   Essential hypertension 05/05/2021   Closed fracture of right orbit ([place]) 01/05/2021   Fracture of alveolus of right mandible, initial encounter for closed fracture ([place]) 01/05/2021   Tobacco dependence 04/23/2019   Marijuana user 04/23/2019   Rectal bleed 04/23/2019   History of pulmonary embolism 04/23/2019   DVT (deep venous thrombosis) ([place]) 03/31/2019   Pulmonary emboli ([place]) 03/27/2019   Hyperlipidemia 01/10/2019   Healthcare maintenance 01/10/2019   Pulmonary embolism ([place]) 12/28/2018   History of DVT (deep vein thrombosis) 12/28/2018   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   Chronic pain of toe, right 06/06/2016   Deformity of toe, right 06/06/2016   Increased frequency of urination 06/09/2013   Nocturia 06/09/2013    REFERRING DIAG: REFERRING DIAG: M54.16 (ICD-10-CM) - Radiculopathy, lumbar region M54.50 (ICD-10-CM) - Low back pain, unspecified back pain laterality, unspecified chronicity, unspecified whether  sciatica present  THERAPY DIAG:  Muscle weakness (generalized)  Chronic right-sided low back pain with right-sided sciatica  Difficulty in walking, not elsewhere classified  PERTINENT HISTORY:  Chronic hx of R sided LBP with referral into posterior R LE  PRECAUTIONS: None   WEIGHT BEARING RESTRICTIONS No  SUBJECTIVE:  Pt presents to PT with continued reports of lower back pain and discomfort.    Pain: Are you having pain? Yes Numerical Pain Scale: 7/10 Pain Location: lower back  OBJECTIVE: Interventions: Therapeutic Exercise:  Nu-step L5 87mwhile taking subjective and planning session with patient Seated sciatic nerve glide 2x20 R LTR x 15 Supine single bent knee fallout - BTB - 2x15 - limited range Supine march 2x20 BTB Modified thomas stretch x 60" ea STS x 10 - no UE support (increased pain) Hip adduction ball squeeze - 2x20 - 5" Leg press 3x8 55lb (NT) Supine bridge with legs on bolster - very small arc - 2x15 Pelvic tilting with feet on bolster - 2x20 (NT) Abdominal contraction with bil shoulder ext -2x10  Home Exercise Program: Access Code: [email or web address]   ASSESSMENT: Pt tolerated treatment fair and was able to complete prescribed exercises, but continues to have significant lower back pain that limits progression of exercise. Therapy today focused on improving core endurance and proximal hip strength. He continues to benefit from skilled PT, HEP was updated for hip flexor stretching at home. Will continue  to be progressed per POC.   GOALS:   SHORT TERM GOALS:   STG Name Target Date Goal status  1 Pt will be compliant and knowledgeable with 90% of initial HEP for improved comfort and carryover Baseline:  06/26/21 MET    LONG TERM GOALS:    LTG Name Target Date Goal status  1 Pt will improve Modified ODI to no less than 40% as proxy for functional improvement Baseline: 62%; 70% on 1/11 2/7/20232 ON GOING  2 Pt will self report R sided LBP no greater than  3/10 at worst for improved comfort and function Baseline: 07/31/2021 ON GOING  3 Pt will improve reps in 30" STS to no less than 6 in order to improve functional mobility and decrease fall risk Baseline: 3 reps; 3 reps on 1/11 07/31/2021 ON GOING  4 Pt will be able to squat and lift 25lb KB from floor to chest level for improved functional ability to perform ADLs Baseline: unable 07/31/2021 ON GOING    PLAN: PT FREQUENCY: 1-2x/week   PT DURATION: 8 weeks   PLANNED INTERVENTIONS: Therapeutic exercises, Therapeutic activity, Neuro Muscular re-education, Balance training, Gait training, Patient/Family education, Joint mobilization, Stair training, Electrical stimulation, Spinal mobilization, Cryotherapy, Moist heat, Taping, and Manual therapy   PLAN FOR NEXT SESSION: assess HEP response, especially extension pref; progress core endurance, LE strength, and functional mobility as able   Ward Chatters 07/19/2021, 10:55 AM

## 2021-07-24 ENCOUNTER — Other Ambulatory Visit: Payer: Self-pay

## 2021-07-24 ENCOUNTER — Ambulatory Visit: Payer: Medicaid Other

## 2021-07-24 DIAGNOSIS — M5441 Lumbago with sciatica, right side: Secondary | ICD-10-CM

## 2021-07-24 DIAGNOSIS — G8929 Other chronic pain: Secondary | ICD-10-CM

## 2021-07-24 DIAGNOSIS — M6281 Muscle weakness (generalized): Secondary | ICD-10-CM

## 2021-07-24 DIAGNOSIS — R262 Difficulty in walking, not elsewhere classified: Secondary | ICD-10-CM

## 2021-07-24 NOTE — Therapy (Signed)
OUTPATIENT PHYSICAL THERAPY TREATMENT NOTE/DISCHARGE   Patient Name: Keith Potter MRN: [id number] DOB:[birth year], 56 y.o., male Today's Date: 07/24/2021  PCP: Ladell Pier, MD REFERRING PROVIDER: Ladell Pier, MD   PT End of Session - 07/24/21 1045     Visit Number 7    Number of Visits 17    Date for PT Re-Evaluation 07/31/21    Authorization Type [employer]    PT Start Time 1045    PT Stop Time 6967    PT Time Calculation (min) 38 min    Activity Tolerance Patient tolerated treatment well    Behavior During Therapy [facility] for tasks assessed/performed              Past Medical History:  Diagnosis Date   DVT (deep venous thrombosis) ([place])    Hypertension    Prediabetes    Pulmonary emboli ([place])    Past Surgical History:  Procedure Laterality Date   AMPUTATION TOE     APPENDECTOMY     HERNIA REPAIR     Patient Active Problem List   Diagnosis Date Noted   Prediabetes 05/12/2021   Elevated PSA 05/12/2021   Former smoker 05/05/2021   Essential hypertension 05/05/2021   Closed fracture of right orbit ([place]) 01/05/2021   Fracture of alveolus of right mandible, initial encounter for closed fracture ([place]) 01/05/2021   Tobacco dependence 04/23/2019   Marijuana user 04/23/2019   Rectal bleed 04/23/2019   History of pulmonary embolism 04/23/2019   DVT (deep venous thrombosis) ([place]) 03/31/2019   Pulmonary emboli ([place]) 03/27/2019   Hyperlipidemia 01/10/2019   Healthcare maintenance 01/10/2019   Pulmonary embolism ([place]) 12/28/2018   History of DVT (deep vein thrombosis) 12/28/2018   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   Chronic pain of toe, right 06/06/2016   Deformity of toe, right 06/06/2016   Increased frequency of urination 06/09/2013   Nocturia 06/09/2013    REFERRING DIAG: REFERRING DIAG: M54.16 (ICD-10-CM) - Radiculopathy, lumbar region M54.50 (ICD-10-CM) - Low back pain, unspecified back pain laterality, unspecified chronicity,  unspecified whether sciatica present  THERAPY DIAG:  Muscle weakness (generalized)  Chronic right-sided low back pain with right-sided sciatica  Difficulty in walking, not elsewhere classified  PERTINENT HISTORY:  Chronic hx of R sided LBP with referral into posterior R LE  PRECAUTIONS: None   WEIGHT BEARING RESTRICTIONS No  SUBJECTIVE:  Pt presents to PT with continued reports of R sided lower back pain with referral into R hip. He states he has not been as compliant with his HEP in the last few days. Pt is ready to begin PT at this time.    Pain: Are you having pain? Yes Numerical Pain Scale: 7/10 Pain Location: lower back  OBJECTIVE: Outcomes: ODI: 66% disability 30 Second STS: 3 reps   Interventions: Therapeutic Exercise:  Nu-step L6 87mwhile taking subjective and planning session with patient Seated sciatic nerve glide 2x20 R LTR x 15 Supine clamshell 2x20 BTB Modified thomas stretch x 60" ea Hip adduction ball squeeze - x 10  Abdominal contraction with bil shoulder ext - 2x20 Therapeutic Activity: Assessment of tests/measures, goals, and outcomes for purposes of discharge  Home Exercise Program: Access Code: [email or web address] ASSESSMENT: Pt was able to complete prescribed exercises and demonstrated knowledge of HEP with no adverse effect. Unfortunately, he has not progressed well with therapy, with continued severe pain and no change in status. His ODI continues to demonstrate severe disability with slight increase in disability score compared  to initial evaluation. Due to lack of progress with therapy, pt is being discharged at this time with HEP in place. He is in agreement with current plan and is ready to discharge from PT at this time.    GOALS:   SHORT TERM GOALS:   STG Name Target Date Goal status  1 Pt will be compliant and knowledgeable with 90% of initial HEP for improved comfort and carryover Baseline:  06/26/21 MET    LONG TERM GOALS:    LTG Name  Target Date Goal status  1 Pt will improve Modified ODI to no less than 40% as proxy for functional improvement Baseline: 62% 07/04/2021: 70% 07/24/2021: 66% 2/7/20232 NOT MET  2 Pt will self report R sided LBP no greater than 3/10 at worst for improved comfort and function Baseline:  07/24/2021: 7/10  07/31/2021 NOT MET  3 Pt will improve reps in 30" STS to no less than 6 in order to improve functional mobility and decrease fall risk Baseline: 3 reps 07/04/2021: 3 reps 07/24/2021: 3 reps 07/31/2021 NOT MET  4 Pt will be able to squat and lift 25lb KB from floor to chest level for improved functional ability to perform ADLs Baseline: unable 07/24/2021: unable 07/31/2021 NOT MET    PLAN: PT FREQUENCY: 1-2x/week   PT DURATION: 8 weeks   PLANNED INTERVENTIONS: Therapeutic exercises, Therapeutic activity, Neuro Muscular re-education, Balance training, Gait training, Patient/Family education, Joint mobilization, Stair training, Electrical stimulation, Spinal mobilization, Cryotherapy, Moist heat, Taping, and Manual therapy   PLAN FOR NEXT SESSION: assess HEP response, especially extension pref; progress core endurance, LE strength, and functional mobility as able   Ward Chatters 07/24/2021, 11:32 AM    PHYSICAL THERAPY DISCHARGE SUMMARY  Visits from Start of Care: 7  Current functional level related to goals / functional outcomes: See goals and objective   Remaining deficits: See goals and objective   Education / Equipment: HEP   Patient agrees to discharge. Patient goals were not met. Patient is being discharged due to lack of progress.

## 2021-07-26 ENCOUNTER — Ambulatory Visit: Payer: Medicaid Other

## 2021-07-26 ENCOUNTER — Telehealth: Payer: Self-pay | Admitting: Internal Medicine

## 2021-07-26 NOTE — Telephone Encounter (Signed)
Call from [facility] says faxed over form for surgical clearance yesterday, and today. They are asking if this was received.  Patient is having surgery Feb 8.  Please call back,

## 2021-07-30 NOTE — Telephone Encounter (Signed)
[facility] was following up on faxs they sent over, caller had the correct fax number, please advise if this has been received.

## 2021-07-30 NOTE — Telephone Encounter (Signed)
Seen form and placed in provider box

## 2021-08-18 ENCOUNTER — Other Ambulatory Visit: Payer: Self-pay | Admitting: Internal Medicine

## 2021-08-18 DIAGNOSIS — I82409 Acute embolism and thrombosis of unspecified deep veins of unspecified lower extremity: Secondary | ICD-10-CM

## 2021-08-18 DIAGNOSIS — Z86711 Personal history of pulmonary embolism: Secondary | ICD-10-CM

## 2021-08-20 NOTE — Telephone Encounter (Signed)
Requested Prescriptions  Pending Prescriptions Disp Refills   XARELTO 20 MG TABS tablet [Pharmacy Med Name: XARELTO 20 MG TABLET] 30 tablet 2    Sig: TAKE 1 TABLET BY MOUTH EVERY DAY WITH SUPPER     Hematology: Anticoagulants - rivaroxaban Failed - 08/18/2021 12:59 AM      Failed - Cr in normal range and within 360 days    Creatinine, Ser  Date Value Ref Range Status  05/07/2021 1.30 (H) 0.76 - 1.27 mg/dL Final         Passed - ALT in normal range and within 360 days    ALT  Date Value Ref Range Status  05/07/2021 33 0 - 44 IU/L Final         Passed - AST in normal range and within 360 days    AST  Date Value Ref Range Status  05/07/2021 28 0 - 40 IU/L Final         Passed - HCT in normal range and within 360 days    Hematocrit  Date Value Ref Range Status  05/07/2021 49.6 37.5 - 51.0 % Final         Passed - HGB in normal range and within 360 days    Hemoglobin  Date Value Ref Range Status  05/07/2021 17.0 13.0 - 17.7 g/dL Final         Passed - PLT in normal range and within 360 days    Platelets  Date Value Ref Range Status  05/07/2021 166 150 - 450 x10E3/uL Final         Passed - eGFR is 15 or above and within 360 days    GFR calc Af Amer  Date Value Ref Range Status  04/01/2019 >60 >60 mL/min Final   GFR calc non Af Amer  Date Value Ref Range Status  04/01/2019 >60 >60 mL/min Final   eGFR  Date Value Ref Range Status  05/07/2021 64 >59 mL/min/1.73 Final         Passed - Patient is not pregnant      Passed - Valid encounter within last 12 months    Recent Outpatient Visits          2 months ago Essential hypertension   [place], Deborah B, MD   3 months ago Preoperative evaluation to rule out surgical contraindication   [place] Karle Plumber B, MD   5 months ago Chronic midline low back pain without sciatica   [facility] [place], Levada Dy M,  [place]   6 months ago Recurrent acute deep vein thrombosis (DVT) of lower extremity, unspecified laterality [facility])   [place], Charlane Ferretti, MD   2 years ago Acute deep vein thrombosis (DVT) of popliteal vein of right lower extremity [facility]   [place], MD      Future Appointments            In 4 days [place], Royetta Crochet, MD [place], [facility]   In 2 months Ladell Pier, MD [place]

## 2021-08-24 ENCOUNTER — Encounter: Payer: Self-pay | Admitting: Internal Medicine

## 2021-08-24 ENCOUNTER — Other Ambulatory Visit: Payer: Self-pay

## 2021-08-24 ENCOUNTER — Ambulatory Visit (INDEPENDENT_AMBULATORY_CARE_PROVIDER_SITE_OTHER): Payer: Medicaid Other | Admitting: Internal Medicine

## 2021-08-24 VITALS — BP 122/80 | HR 61 | Ht 73.0 in | Wt 239.6 lb

## 2021-08-24 DIAGNOSIS — E785 Hyperlipidemia, unspecified: Secondary | ICD-10-CM | POA: Diagnosis not present

## 2021-08-24 DIAGNOSIS — Z86711 Personal history of pulmonary embolism: Secondary | ICD-10-CM

## 2021-08-24 DIAGNOSIS — I82409 Acute embolism and thrombosis of unspecified deep veins of unspecified lower extremity: Secondary | ICD-10-CM

## 2021-08-24 MED ORDER — ATORVASTATIN CALCIUM 80 MG PO TABS
80.0000 mg | ORAL_TABLET | Freq: Every day | ORAL | 3 refills | Status: DC
  Filled 2021-08-24: qty 90, 90d supply, fill #0
  Filled 2022-01-15: qty 90, 90d supply, fill #1
  Filled 2022-04-30: qty 90, 90d supply, fill #2
  Filled 2022-08-15: qty 90, 90d supply, fill #3

## 2021-08-24 MED ORDER — RIVAROXABAN 20 MG PO TABS
ORAL_TABLET | ORAL | 2 refills | Status: DC
  Filled 2021-08-24: qty 30, fill #0
  Filled 2021-08-24 (×2): qty 30, 30d supply, fill #0
  Filled 2022-01-15: qty 30, 30d supply, fill #1
  Filled 2022-02-18: qty 30, 30d supply, fill #2

## 2021-08-24 NOTE — Progress Notes (Signed)
?Heart and Vascular Care Navigation ? ?08/24/2021 ? ?Xue Low ?12/01/1965 ?[id number] ? ?Reason for Referral:  ?Transportation to obtain medications ?Engaged with patient face to face for initial visit for Heart and Vascular Care Coordination. ?                                                                                                  ?Assessment:     ?LCSW met with Keith Potter during his appointment here at [facility] office. Introduced self, role, reason for visit. Keith Potter shares that he came to appt via Medicaid transportation but [place] take him to pharmacy unless he has an appointment. [facility] [place] [facility] but medications tend to be more expensive there. He does not have an appt at [facility] until April. LCSW shared that we could assist with ride today. Keith Potter shared that other than medication changed today that he is out of his Xarelto. Hayley, RN, checked and that is ordered through PCP. We encouraged Keith Potter to continue to take it until the instructed hold for upcoming procedure. I communicated with Jay'a, [place], at [facility]. She was able to have it [facility]. LCSW will coordinate a ride via [place] for Keith Potter and confirmed w/ Keith Potter that I could text him when it was scheduled. Keith Potter appreciative of clinic support.                                  ? ?HRT/VAS Care Coordination   ? ? Patients Home Cardiology Office [facility]  ? Outpatient Care Team Social Worker  ? Social Worker Name: Margarito [facility] [phone]  ? Living arrangements for the past 2 months Apartment  ? Lives with: Self  ? Patient Current Insurance Coverage Medicaid  [employer]  ? Patient Has Concern With Paying Medical Bills No  ? Does Patient Have Prescription Coverage? Yes  ? Home Assistive Devices/Equipment None  ? ?  ? ? ?Social History:                                                                             ?SDOH Screenings  ? ?Alcohol Screen: Not on file  ?Depression (PHQ2-9): Low  Risk   ? PHQ-2 Score: 0  ?Financial Resource Strain: Not on file  ?Food Insecurity: Not on file  ?Housing: Not on file  ?Physical Activity: Not on file  ?Social Connections: Not on file  ?Stress: Not on file  ?Tobacco Use: Medium Risk  ? Smoking Tobacco Use: Former  ? Smokeless Tobacco Use: Never  ? Passive Exposure: Not on file  ?Transportation Needs: Unmet Transportation Needs  ? Lack of Transportation (Medical): Yes  ? Lack of Transportation (Non-Medical): Yes  ? ? ?SDOH Interventions: ?  Transportation:   Transportation Interventions: [employer]  ? ? ?Other Care Navigation Interventions:    ? ?Provided Pharmacy assistance resources  Transportation to pharmacy, assistance w/ transferring meds to [place].  ? ?Follow-up plan:   ?LCSW arranged ride through [place] for Keith Potter to obtain medications. Keith Potter aware ride will be at 2pm, he can text ready when completed at pharmacy for ride home. If any hiccups occur Keith Potter was encouraged to call or text this writer back. I remain available.  ? ? ? ?

## 2021-08-24 NOTE — Patient Instructions (Addendum)
Medication Instructions:  ?INCREASE atorvastatin (Lipitor) to 80 mg daily ? ?*If you need a refill on your cardiac medications before your next appointment, please call your pharmacy* ? ?Lab Work: ?Please return for FASTING labs in 6 weeks (Lipid) ? ?Our in office lab hours are Monday-Friday 8:00-4:00, closed for lunch 12:45-1:45 pm.  No appointment needed. ? ?Follow-Up: ?At [facility], you and your health needs are our priority.  As part of our continuing mission to provide you with exceptional heart care, we have created designated Provider Care Teams.  These Care Teams include your primary Cardiologist (physician) and Advanced Practice Providers (APPs -  Physician Assistants and Nurse Practitioners) who all work together to provide you with the care you need, when you need it. ? ?We recommend signing up for the patient portal called "MyChart".  Sign up information is provided on this After Visit Summary.  MyChart is used to connect with patients for Virtual Visits (Telemedicine).  Patients are able to view lab/test results, encounter notes, upcoming appointments, etc.  Non-urgent messages can be sent to your provider as well.   ?To learn more about what you can do with MyChart, go to [email or web address].   ? ?Your next appointment:   ?3 month(s) ? ?The format for your next appointment:   ?In Person ? ?Provider:   ?Maisie Fus, MD   ? ? ?Other Instructions ?Please purchase a blood pressure cuff. Please take your blood pressure at home for two weeks, once per day. Please let us know what your blood pressures are on mychart. ?

## 2021-08-24 NOTE — Progress Notes (Signed)
?Cardiology Office Note:   ? ?Date:  08/24/2021  ? ?ID:  Keith Potter, DOB 09/01/1965, MRN [id number] ? ?PCP:  Ladell Pier, MD ?  ?[facility] Providers ?Cardiologist:  Janina Mayo, MD    ? ?Referring MD: Ladell Pier, MD  ? ?No chief complaint on file. ? ?Pre-Op Evaluation ? ?History of Present Illness:   ? ?Keith Potter is a 56 y.o. 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 ?He is having chest pain and goes to his back. He said it was at night. He just finished eating. He had to stand up and stretch. It went away and then returned. He has a sharp pain/ and cramping. During the day, he doesn't do to much during the day. He walks to the store, he does not notice it. He gets some shortness of breath with walking. This has been 2-3 weeks. He lost his toes in a work accident.  ? ?He has not seen a cardiologist. He has no hx of stress test or LHC.  ? 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 ?Interim Hx: ?Don't see documentation, per patient hammertoe surgery canceled 2/2 high LDL and blood pressure. Blood pressures typically well controlled. He had normal lexiscan stress ; EF <50 unclear if accurate. Echo showed low normal function.  ? ?Past Medical History:  ?Diagnosis Date  ? DVT (deep venous thrombosis) ([place])   ? Hypertension   ? Prediabetes   ? Pulmonary emboli ([place])   ? ? ?Past Surgical History:  ?Procedure Laterality Date  ? AMPUTATION TOE    ? APPENDECTOMY    ? HERNIA REPAIR    ? ? ?Current Medications: ?Current Meds  ?Medication Sig  ? amLODipine (NORVASC) 5 MG tablet Take 1 tablet (5 mg total) by mouth daily.  ? Ascorbic Acid (VITAMIN C) 1000 MG tablet Take 1,000 mg by mouth daily.  ?  atorvastatin (LIPITOR) 20 MG tablet Take 1 tablet (20 mg total) by mouth daily.  ? cyclobenzaprine (FLEXERIL) 10 MG tablet Take 1 tablet (10 mg total) by mouth 3 (three) times daily as needed for muscle spasms.  ? methylPREDNISolone (MEDROL) 4 MG tablet 6 day taper to be taken as directed.  ? Multiple Vitamin (MULTIVITAMIN WITH MINERALS) TABS tablet Take 1 tablet by mouth daily.  ? pregabalin (LYRICA) 75 MG capsule Take 1 capsule (75 mg total) by mouth 2 (two) times daily.  ? tamsulosin (FLOMAX) 0.4 MG CAPS capsule Take 1 capsule (0.4 mg total) by mouth daily.  ? traMADol (ULTRAM) 50 MG tablet Take 2 tablets (100 mg total) by mouth every 6 (six) hours as needed.  ? XARELTO 20 MG TABS tablet TAKE 1 TABLET BY MOUTH EVERY DAY WITH SUPPER  ?  ? ?Allergies:   Patient has no known allergies.  ? ?Social History  ? ?Socioeconomic History  ? Marital status: Single  ?  Spouse name: Not on file  ? Number of children: Not on file  ? Years of education: Not on file  ? Highest education level: Not on file  ?Occupational History  ? Not on file  ?Tobacco Use  ? Smoking status: Former  ?  Packs/day: 0.50  ?  Types: Cigarettes  ? Smokeless tobacco: Never  ?Vaping Use  ? Vaping Use: Never used  ?Substance and Sexual Activity  ? Alcohol use: Not Currently  ? Drug use: Yes  ?  Types: Marijuana  ? Sexual activity: Yes  ?  Partners: Female  ?  Birth control/protection: Condom  ?Other Topics Concern  ? Not on file  ?Social History Narrative  ? Not on file  ? ?Social Determinants of Health  ? ?Financial Resource Strain: Not on file  ?Food Insecurity: Not on file  ?Transportation Needs: Not on file  ?Physical Activity: Not on file  ?Stress: Not on file  ?Social Connections: Not on file  ?  ? ?Family History: ?The patient's family history includes Diabetes in his father and mother. ? ?ROS:   ?Please see the history of present illness.    ? All other systems reviewed and are negative. ? ?EKGs/Labs/Other Studies Reviewed:   ? ?The following  studies were reviewed today: ? ? ?EKG:  EKG is  ordered today.  The ekg ordered today demonstrates  ? ?NSR HR 71 bpm, Qtc 402 ms ? ?Recent Labs: ?05/07/2021: ALT 33; BUN 22; Creatinine, Ser 1.30; Hemoglobin 17.0; Platelets 166; Potassium 4.1; Sodium 140  ?Recent Lipid Panel ?   ?Component Value Date/Time  ? CHOL 289 (H) 05/07/2021 9432  ? TRIG 85 05/07/2021 0938  ? HDL 38 (L) 05/07/2021 7614  ? CHOLHDL 7.6 (H) 05/07/2021 7092  ? CHOLHDL 5.5 12/28/2018 2042  ? VLDL 15 12/28/2018 2042  ? LDLCALC 237 (H) 05/07/2021 9574  ? ? ? ?Risk Assessment/Calculations:   ?  ?The 10-year ASCVD risk score (Arnett DK, et al., 2019) is: 13.7% ?  Values used to calculate the score: ?    Age: 23 years ?    Sex: Male ?    Is Non-Hispanic African American: Yes ?    Diabetic: No ?    Tobacco smoker: No ?    Systolic Blood Pressure: 122 mmHg ?    Is BP treated: Yes ?    HDL Cholesterol: 38 mg/dL ?    Total Cholesterol: 289 mg/dL ? ?    ? ?Physical Exam:   ? ?VS:  ? ?Vitals:  ? 08/24/21 0918  ?BP: 122/80  ?Pulse: 61  ?SpO2: 97%  ? ? ? ?Wt Readings from Last 3 Encounters:  ?08/24/21 239 lb 9.6 oz (108.7 kg)  ?07/17/21 239 lb (108.4 kg)  ?06/12/21 234 lb (106.1 kg)  ?  ? ?GEN:  Well nourished, well developed in no acute distress ?HEENT: moist mucous membranes ?LYMPHATICS: No lymphadenopathy ?CARDIAC: RRR, no murmurs, rubs, gallops ?RESPIRATORY:  Clear to auscultation without rales, wheezing or rhonchi  ?ABDOMEN: Soft, non-tender, non-distended ?MUSCULOSKELETAL:  No edema; No deformity  ?SKIN: Warm and dry ?NEUROLOGIC:  Alert and oriented x 3 ?PSYCHIATRIC:  Normal affect  ? ?ASSESSMENT:   ? ?#Pre-op: He has acceptable cardiac risk for severe hammertoe contracture deformity surgery. He had a normal lexiscan stress and echo. Blood pressure elevated sporadically, will recommend ambulatory blood pressure monitoring. Xarelto can be held for the procedure for any duration.  ? ?HTN-  He has normal blood pressures today. May have elevated pressures in  clinic. Recommending ambulatory blood pressure measurements. Continue norvasc 5 mg daily. BP goal < 130/80 mmHg. If consistently elevated at home, recommend increasing his norvasc and adding hydrochlorothiazide 25 mg daily. ? ?HLD:  Patient has moderate ASCVD risk. LDL 237- Increase atorvastatin to 80 mg daily.  LDL goal at least < 100 mg/dL. FH is possible in this range. Will aim to be aggressive with therapy ? ?PLAN:   ? ?In order of problems listed above: ? ?Lipid profile 6 weeks ?Increase to atorvastatin 80 mg daily ?Follow up 3 months ? ?   ? ?Shared Decision Making/Informed Consent ?The risks [chest pain, shortness of breath, cardiac arrhythmias, dizziness, blood pressure fluctuations, myocardial infarction, stroke/transient ischemic attack, nausea, vomiting, allergic reaction, radiation exposure, metallic taste sensation and life-threatening complications (estimated to be 1 in 10,000)], benefits (risk stratification, diagnosing coronary artery disease, treatment guidance) and alternatives of a nuclear stress test were discussed in detail with Keith Potter and he agrees to proceed.  ? ? ?Medication Adjustments/Labs and Tests Ordered: ?Current medicines are reviewed at length with the patient today.  Concerns regarding medicines are outlined above.  ? ? ?Signed, ?Janina Mayo, MD  ?08/24/2021 9:31 AM    ?[place] ? ?

## 2021-09-22 ENCOUNTER — Emergency Department (HOSPITAL_BASED_OUTPATIENT_CLINIC_OR_DEPARTMENT_OTHER)
Admission: EM | Admit: 2021-09-22 | Discharge: 2021-09-22 | Disposition: A | Discharge status: Home / Self Care | Payer: Medicaid Other | Attending: Emergency Medicine | Admitting: Emergency Medicine

## 2021-09-22 ENCOUNTER — Emergency Department (HOSPITAL_BASED_OUTPATIENT_CLINIC_OR_DEPARTMENT_OTHER): Payer: Medicaid Other

## 2021-09-22 ENCOUNTER — Other Ambulatory Visit: Payer: Self-pay

## 2021-09-22 ENCOUNTER — Encounter (HOSPITAL_BASED_OUTPATIENT_CLINIC_OR_DEPARTMENT_OTHER): Payer: Self-pay | Admitting: Emergency Medicine

## 2021-09-22 DIAGNOSIS — Z7901 Long term (current) use of anticoagulants: Secondary | ICD-10-CM | POA: Insufficient documentation

## 2021-09-22 DIAGNOSIS — M545 Low back pain, unspecified: Secondary | ICD-10-CM | POA: Diagnosis not present

## 2021-09-22 DIAGNOSIS — R079 Chest pain, unspecified: Secondary | ICD-10-CM | POA: Diagnosis present

## 2021-09-22 DIAGNOSIS — R0602 Shortness of breath: Secondary | ICD-10-CM | POA: Insufficient documentation

## 2021-09-22 LAB — TROPONIN I (HIGH SENSITIVITY)
Troponin I (High Sensitivity): 25 ng/L — ABNORMAL HIGH (ref <18)
Troponin I (High Sensitivity): 26 ng/L — ABNORMAL HIGH (ref <18)

## 2021-09-22 LAB — CBC WITH DIFFERENTIAL/PLATELET
Abs Immature Granulocytes: 0.05 10*3/uL (ref 0.00–0.07)
Basophils Absolute: 0.1 10*3/uL (ref 0.0–0.1)
Basophils Relative: 1 %
Eosinophils Absolute: 0.1 10*3/uL (ref 0.0–0.5)
Eosinophils Relative: 1 %
HCT: 47 % (ref 39.0–52.0)
Hemoglobin: 16 g/dL (ref 13.0–17.0)
Immature Granulocytes: 0 %
Lymphocytes Relative: 22 %
Lymphs Abs: 2.6 10*3/uL (ref 0.7–4.0)
MCH: 30.9 pg (ref 26.0–34.0)
MCHC: 34 g/dL (ref 30.0–36.0)
MCV: 90.7 fL (ref 80.0–100.0)
Monocytes Absolute: 0.9 10*3/uL (ref 0.1–1.0)
Monocytes Relative: 8 %
Neutro Abs: 8.1 10*3/uL — ABNORMAL HIGH (ref 1.7–7.7)
Neutrophils Relative %: 68 %
Platelets: 209 10*3/uL (ref 150–400)
RBC: 5.18 MIL/uL (ref 4.22–5.81)
RDW: 12.8 % (ref 11.5–15.5)
WBC: 11.8 10*3/uL — ABNORMAL HIGH (ref 4.0–10.5)
nRBC: 0 % (ref 0.0–0.2)

## 2021-09-22 LAB — BASIC METABOLIC PANEL
Anion gap: 11 (ref 5–15)
BUN: 16 mg/dL (ref 6–20)
CO2: 22 mmol/L (ref 22–32)
Calcium: 9.8 mg/dL (ref 8.9–10.3)
Chloride: 107 mmol/L (ref 98–111)
Creatinine, Ser: 1.54 mg/dL — ABNORMAL HIGH (ref 0.61–1.24)
GFR, Estimated: 52 mL/min — ABNORMAL LOW (ref >60)
Glucose, Bld: 113 mg/dL — ABNORMAL HIGH (ref 70–99)
Potassium: 3.9 mmol/L (ref 3.5–5.1)
Sodium: 140 mmol/L (ref 135–145)

## 2021-09-22 LAB — BRAIN NATRIURETIC PEPTIDE: B Natriuretic Peptide: 22.9 pg/mL (ref 0.0–100.0)

## 2021-09-22 MED ORDER — MORPHINE SULFATE (PF) 4 MG/ML IV SOLN
4.0000 mg | Freq: Once | INTRAVENOUS | Status: AC
  Administered 2021-09-22: 4 mg via INTRAVENOUS
  Filled 2021-09-22: qty 1

## 2021-09-22 MED ORDER — ONDANSETRON HCL 4 MG/2ML IJ SOLN
4.0000 mg | Freq: Once | INTRAMUSCULAR | Status: AC
  Administered 2021-09-22: 4 mg via INTRAVENOUS
  Filled 2021-09-22: qty 2

## 2021-09-22 MED ORDER — IOHEXOL 350 MG/ML SOLN
100.0000 mL | Freq: Once | INTRAVENOUS | Status: AC | PRN
Start: 2021-09-22 — End: 2021-09-22
  Administered 2021-09-22: 100 mL via INTRAVENOUS

## 2021-09-22 NOTE — ED Notes (Signed)
Patient transported to CT 

## 2021-09-22 NOTE — ED Notes (Signed)
X-ray at bedside

## 2021-09-22 NOTE — ED Notes (Signed)
ED Provider at bedside. 

## 2021-09-22 NOTE — Discharge Instructions (Signed)
Your second troponin did not significantly change from the first.  Please follow-up with your family doctor in the office.  They may decide to send you to see the cardiologist. ?

## 2021-09-22 NOTE — ED Triage Notes (Signed)
Pt arrives pov, slow gait to triage, c/o CP and shob since last night. Also reports lower back pain ?

## 2021-09-22 NOTE — ED Notes (Signed)
Pt returning from CT with nausea from contrast. Denies trouble breathing, RR even and unlabored.  ?

## 2021-09-22 NOTE — ED Provider Notes (Signed)
?[facility] EMERGENCY DEPARTMENT ?Provider Note ? ? ?CSN: [id number] ?Arrival date & time: 09/22/21  1533 ? ?  ? ?History ? ?Chief Complaint  ?Patient presents with  ? Chest Pain  ? ? ?Keith Potter is a 56 y.o. male. ? ?56 yo M with a chief complaints of chest pain.  This been going on for a few weeks now.  Left-sided seems to come and go.  Seems to be worse with exertion deep breathing and certain positions.  Has also been having right-sided low back pain.  This been an ongoing problem for him.  He has had it off and on ever since he had a couple toes amputated after injury at work.  He does have a history of DVT.  He is on Xarelto.  Tells me that he has been compliant with it though his most recent cardiology visit about a month ago he was noted to be out of the medication and it was represcribed for him.  He denies hemoptysis. ? ? ?Chest Pain ? ?  ? ?Home Medications ?Prior to Admission medications   ?Medication Sig Start Date End Date Taking? Authorizing Provider  ?amLODipine (NORVASC) 5 MG tablet Take 1 tablet (5 mg total) by mouth daily. 05/04/21   Ladell Pier, MD  ?Ascorbic Acid (VITAMIN C) 1000 MG tablet Take 1,000 mg by mouth daily.    [provider]  ?atorvastatin (LIPITOR) 80 MG tablet Take 1 tablet (80 mg total) by mouth daily. 08/24/21   Janina Mayo, MD  ?cyclobenzaprine (FLEXERIL) 10 MG tablet Take 1 tablet (10 mg total) by mouth 3 (three) times daily as needed for muscle spasms. 03/22/21   Argentina Donovan, PA-C  ?methylPREDNISolone (MEDROL) 4 MG tablet 6 day taper to be taken as directed. 04/04/21   Lanae Crumbly, PA-C  ?Multiple Vitamin (MULTIVITAMIN WITH MINERALS) TABS tablet Take 1 tablet by mouth daily.    [provider]  ?pregabalin (LYRICA) 75 MG capsule Take 1 capsule (75 mg total) by mouth 2 (two) times daily. 05/24/21   Jessy Oto, MD  ?rivaroxaban (XARELTO) 20 MG TABS tablet TAKE 1 TABLET BY MOUTH EVERY DAY WITH SUPPER 08/24/21   Ladell Pier, MD   ?tamsulosin (FLOMAX) 0.4 MG CAPS capsule Take 1 capsule (0.4 mg total) by mouth daily. 05/04/21   Ladell Pier, MD  ?traMADol (ULTRAM) 50 MG tablet Take 2 tablets (100 mg total) by mouth every 6 (six) hours as needed. 04/25/21   Jessy Oto, MD  ?   ? ?Allergies    ?Patient has no known allergies.   ? ?Review of Systems   ?Review of Systems  ?Cardiovascular:  Positive for chest pain.  ? ?Physical Exam ?Updated Vital Signs ?BP 120/87   Pulse 74   Temp 98 ?F (36.7 ?C) (Oral)   Resp 16   Ht 6\' 1"  (1.854 m)   Wt 108.4 kg   SpO2 95%   BMI 31.53 kg/m?  ?Physical Exam ?Vitals and nursing note reviewed.  ?Constitutional:   ?   Appearance: He is well-developed.  ?HENT:  ?   Head: Normocephalic and atraumatic.  ?Eyes:  ?   Pupils: Pupils are equal, round, and reactive to light.  ?Neck:  ?   Vascular: No JVD.  ?Cardiovascular:  ?   Rate and Rhythm: Normal rate and regular rhythm.  ?   Heart sounds: No murmur heard. ?  No friction rub. No gallop.  ?Pulmonary:  ?   Effort:  No respiratory distress.  ?   Breath sounds: No wheezing.  ?Chest:  ?   Chest wall: No tenderness.  ?Abdominal:  ?   General: There is no distension.  ?   Tenderness: There is no abdominal tenderness. There is no guarding or rebound.  ?Musculoskeletal:     ?   General: Normal range of motion.  ?   Cervical back: Normal range of motion and neck supple.  ?   Comments: Pulse motor and sensation intact to the right lower extremity.  Reflexes 2+ and equal.  No clonus.  Ambulates without issue.  ?Skin: ?   Coloration: Skin is not pale.  ?   Findings: No rash.  ?Neurological:  ?   Mental Status: He is alert and oriented to person, place, and time.  ?Psychiatric:     ?   Behavior: Behavior normal.  ? ? ?ED Results / Procedures / Treatments   ?Labs ?(all labs ordered are listed, but only abnormal results are displayed) ?Labs Reviewed  ?CBC WITH DIFFERENTIAL/PLATELET - Abnormal; Notable for the following components:  ?    Result Value  ? WBC 11.8 (*)   ?  Neutro Abs 8.1 (*)   ? All other components within normal limits  ?BASIC METABOLIC PANEL - Abnormal; Notable for the following components:  ? Glucose, Bld 113 (*)   ? Creatinine, Ser 1.54 (*)   ? GFR, Estimated 52 (*)   ? All other components within normal limits  ?TROPONIN I (HIGH SENSITIVITY) - Abnormal; Notable for the following components:  ? Troponin I (High Sensitivity) 25 (*)   ? All other components within normal limits  ?TROPONIN I (HIGH SENSITIVITY) - Abnormal; Notable for the following components:  ? Troponin I (High Sensitivity) 26 (*)   ? All other components within normal limits  ?BRAIN NATRIURETIC PEPTIDE  ? ? ?EKG ?EKG Interpretation ? ?Date/Time:  Saturday September 22 2021 15:45:10 EDT ?Ventricular Rate:  108 ?PR Interval:  180 ?QRS Duration: 94 ?QT Interval:  314 ?QTC Calculation: 420 ?R Axis:   59 ?Text Interpretation: Sinus tachycardia with occasional Premature ventricular complexes Anterior infarct , age undetermined Abnormal ECG Since last tracing rate faster Otherwise no significant change Confirmed by Deno Etienne [phone]) on 09/22/2021 3:47:47 PM ? ?Radiology ?CT Angio Chest PE W and/or Wo Contrast ? ?Result Date: 09/22/2021 ?CLINICAL DATA:  Rule out pulmonary embolus. Patient complains of chest pain and SOB. EXAM: CT ANGIOGRAPHY CHEST WITH CONTRAST TECHNIQUE: Multidetector CT imaging of the chest was performed using the standard protocol during bolus administration of intravenous contrast. Multiplanar CT image reconstructions and MIPs were obtained to evaluate the vascular anatomy. RADIATION DOSE REDUCTION: This exam was performed according to the departmental dose-optimization program which includes automated exposure control, adjustment of the mA and/or kV according to patient size and/or use of iterative reconstruction technique. CONTRAST:  165mL OMNIPAQUE IOHEXOL 350 MG/ML SOLN COMPARISON:  03/26/2019 FINDINGS: Cardiovascular: Satisfactory opacification of the pulmonary arteries to the segmental  level. No evidence of pulmonary embolism. Normal heart size. No pericardial effusion. Mediastinum/Nodes: No enlarged mediastinal, hilar, or axillary lymph nodes. Thyroid gland, trachea, and esophagus demonstrate no significant findings. Lungs/Pleura: No pleural effusion, airspace consolidation, or atelectasis. No pneumothorax identified. Mild paraseptal emphysema. No suspicious lung nodules. Upper Abdomen: No acute abnormality. Musculoskeletal: No chest wall abnormality. No acute or significant osseous findings. Review of the MIP images confirms the above findings. IMPRESSION: 1. No evidence for acute pulmonary embolism. No acute cardiopulmonary abnormalities to account for patient's  chest pain and shortness of breath. 2. Emphysema (ICD10-J43.9). Electronically Signed   By: Kerby Moors M.D.   On: 09/22/2021 17:10  ? ?DG Chest Port 1 View ? ?Result Date: 09/22/2021 ?CLINICAL DATA:  Shortness of breath. EXAM: PORTABLE CHEST 1 VIEW COMPARISON:  Chest x-ray 12/26/2020. FINDINGS: The heart size and mediastinal contours are within normal limits. Both lungs are clear. The visualized skeletal structures are unremarkable. IMPRESSION: No active disease. Electronically Signed   By: Ronney Asters M.D.   On: 09/22/2021 16:10   ? ?Procedures ?Procedures  ? ? ?Medications Ordered in ED ?Medications  ?morphine (PF) 4 MG/ML injection 4 mg (4 mg Intravenous Given 09/22/21 1612)  ?ondansetron [facility]) injection 4 mg (4 mg Intravenous Given 09/22/21 1612)  ?iohexol (OMNIPAQUE) 350 MG/ML injection 100 mL (100 mLs Intravenous Contrast Given 09/22/21 1645)  ?ondansetron [facility]) injection 4 mg (4 mg Intravenous Given 09/22/21 1658)  ? ? ?ED Course/ Medical Decision Making/ A&P ?  ?                        ?Medical Decision Making ?Amount and/or Complexity of Data Reviewed ?Labs: ordered. ?Radiology: ordered. ? ?Risk ?Prescription drug management. ? ? ?56 yo M with a chief complaint of chest pain and low back pain.  Patient unfortunately has a  history of DVT and has been having chest pain and difficulty breathing off and on for about 3 weeks now.  He did stop his Xarelto at the onset of this because he had run out.  Has been back on the medicine re

## 2021-10-08 ENCOUNTER — Encounter: Payer: Self-pay | Admitting: Specialist

## 2021-10-08 ENCOUNTER — Ambulatory Visit (INDEPENDENT_AMBULATORY_CARE_PROVIDER_SITE_OTHER): Payer: Medicaid Other | Admitting: Specialist

## 2021-10-08 VITALS — BP 130/80 | HR 85 | Ht 73.0 in | Wt 232.6 lb

## 2021-10-08 DIAGNOSIS — M545 Low back pain, unspecified: Secondary | ICD-10-CM

## 2021-10-08 DIAGNOSIS — M5136 Other intervertebral disc degeneration, lumbar region: Secondary | ICD-10-CM | POA: Diagnosis not present

## 2021-10-08 DIAGNOSIS — M5441 Lumbago with sciatica, right side: Secondary | ICD-10-CM | POA: Diagnosis not present

## 2021-10-08 MED ORDER — PREGABALIN 100 MG PO CAPS: 100.0000 mg | ORAL_CAPSULE | Freq: Two times a day (BID) | ORAL | 0 refills | Status: DC

## 2021-10-08 MED ORDER — TRAMADOL HCL 50 MG PO TABS: 100.0000 mg | ORAL_TABLET | Freq: Four times a day (QID) | ORAL | 0 refills | Status: DC | PRN

## 2021-10-08 NOTE — Patient Instructions (Signed)
Plan: Avoid frequent bending and stooping  ?No lifting greater than 10 lbs. ?May use ice or moist heat for pain. ?Weight loss is of benefit. ? ?MRI of the lumbar spine is ordered for persisting low back pain. ?Exercise is important to improve your indurance and does allow people to function better inspite of back pain. ?

## 2021-10-08 NOTE — Progress Notes (Signed)
? ?Office Visit Note ?  ?Patient: Keith Potter           ?Date of Birth: 04-10-1966           ?MRN: [id number] ?Visit Date: 10/08/2021 ?             ?Requested by: Ladell Pier, MD ?[address] ?[address] ?[place],  [place] [address] ?PCP: Ladell Pier, MD ? ? ?Assessment & Plan: ?Visit Diagnoses:  ?1. Low back pain, unspecified back pain laterality, unspecified chronicity, unspecified whether sciatica present   ?2. Degenerative disc disease, lumbar   ? ? ?Plan: Avoid frequent bending and stooping  ?No lifting greater than 10 lbs. ?May use ice or moist heat for pain. ?Weight loss is of benefit. ? ?MRI of the lumbar spine is ordered for persisting low back pain. ?Exercise is important to improve your indurance and does allow people to function better inspite of back pain. ?  ? ?Follow-Up Instructions: No follow-ups on file.  ? ?Orders:  ?No orders of the defined types were placed in this encounter. ? ?No orders of the defined types were placed in this encounter. ? ? ? ? Procedures: ?No procedures performed ? ? ?Clinical Data: ?No additional findings. ? ? ?Subjective: ?Chief Complaint  ?Patient presents with  ? Lower Back - Follow-up  ? ? ?56 year old male with history of back pain with radiation into the legs right more than left. He relates that the pain is worsening into the left leg with pain with movement of the back and leg. No bowel or bladder change, has a history of prostate condition, had biopsies that returned negative. He has pain with sitting and with standing and walking. Has been to PT for 7 sessions and was discharged with no improvement in his pain pattern.  ? ? ?Review of Systems ? ? ?Objective: ?Vital Signs: BP 130/80 (BP Location: Left Arm, Patient Position: Sitting, Cuff Size: Large)   Pulse 85   Ht 6\' 1"  (1.854 m)   Wt 232 lb 9.6 oz (105.5 kg)   SpO2 96%   BMI 30.69 kg/m?  ? ?Physical Exam ? ?Back Exam  ? ?Tenderness  ?The patient is experiencing tenderness in the lumbar. ? ?Range  of Motion  ?Extension:  abnormal  ?Flexion:  abnormal  ?Lateral bend right:  abnormal  ?Lateral bend left:  abnormal  ?Rotation right:  abnormal  ?Rotation left:  abnormal  ? ?Muscle Strength  ?Left Quadriceps:  5/5  ?Right Hamstrings:  5/5  ?Left Hamstrings:  5/5  ? ?Reflexes  ?Patellar:  0/4 ?Achilles:  2/4 ? ?Comments:  SLR is positive right leg at 30 degrees and on th left side ? ? ? ?Specialty Comments:  ?No specialty comments available. ? ?Imaging: ?No results found. ? ? ?PMFS History: ?Patient Active Problem List  ? Diagnosis Date Noted  ? Prediabetes 05/12/2021  ? Elevated PSA 05/12/2021  ? Former smoker 05/05/2021  ? Essential hypertension 05/05/2021  ? Closed fracture of right orbit ([facility]) 01/05/2021  ? Fracture of alveolus of right mandible, initial encounter for closed fracture ([place]) 01/05/2021  ? Tobacco dependence 04/23/2019  ? Marijuana user 04/23/2019  ? Rectal bleed 04/23/2019  ? History of pulmonary embolism 04/23/2019  ? DVT (deep venous thrombosis) ([facility]) 03/31/2019  ? Pulmonary emboli ([place]) 03/27/2019  ? Hyperlipidemia 01/10/2019  ? Healthcare maintenance 01/10/2019  ? Pulmonary embolism ([facility]) 12/28/2018  ? History of DVT (deep vein thrombosis) 12/28/2018  ? Acquired claw toe of  right foot 07/14/2016  ? Chronic pain of toe, right 06/06/2016  ? Deformity of toe, right 06/06/2016  ? Increased frequency of urination 06/09/2013  ? Nocturia 06/09/2013  ? ?Past Medical History:  ?Diagnosis Date  ? DVT (deep venous thrombosis) ([facility])   ? Hypertension   ? Prediabetes   ? Pulmonary emboli ([facility] [facility])   ?  ?Family History  ?Problem Relation Age of Onset  ? Diabetes Mother   ? Diabetes Father   ?  ?Past Surgical History:  ?Procedure Laterality Date  ? AMPUTATION TOE    ? APPENDECTOMY    ? HERNIA REPAIR    ? ?Social History  ? ?Occupational History  ? Not on file  ?Tobacco Use  ? Smoking status: Former  ?  Packs/day: 0.50  ?  Types: Cigarettes  ? Smokeless tobacco: Never  ?Vaping Use  ? Vaping Use: Never used   ?Substance and Sexual Activity  ? Alcohol use: Not Currently  ? Drug use: Yes  ?  Types: Marijuana  ? Sexual activity: Yes  ?  Partners: Female  ?  Birth control/protection: Condom  ? ? ? ? ? ? ?

## 2021-10-12 ENCOUNTER — Ambulatory Visit: Payer: Medicaid Other | Admitting: Internal Medicine

## 2021-10-19 ENCOUNTER — Encounter: Payer: Self-pay | Admitting: Internal Medicine

## 2021-10-19 ENCOUNTER — Ambulatory Visit: Payer: Medicaid Other | Attending: Internal Medicine | Admitting: Internal Medicine

## 2021-10-19 ENCOUNTER — Other Ambulatory Visit: Payer: Self-pay

## 2021-10-19 VITALS — BP 114/79 | HR 69 | Ht 73.0 in | Wt 237.0 lb

## 2021-10-19 DIAGNOSIS — R350 Frequency of micturition: Secondary | ICD-10-CM

## 2021-10-19 DIAGNOSIS — Z23 Encounter for immunization: Secondary | ICD-10-CM

## 2021-10-19 DIAGNOSIS — E785 Hyperlipidemia, unspecified: Secondary | ICD-10-CM | POA: Diagnosis not present

## 2021-10-19 DIAGNOSIS — Z86711 Personal history of pulmonary embolism: Secondary | ICD-10-CM

## 2021-10-19 DIAGNOSIS — N401 Enlarged prostate with lower urinary tract symptoms: Secondary | ICD-10-CM | POA: Diagnosis not present

## 2021-10-19 DIAGNOSIS — R972 Elevated prostate specific antigen [PSA]: Secondary | ICD-10-CM

## 2021-10-19 DIAGNOSIS — E669 Obesity, unspecified: Secondary | ICD-10-CM

## 2021-10-19 DIAGNOSIS — D6859 Other primary thrombophilia: Secondary | ICD-10-CM

## 2021-10-19 DIAGNOSIS — I10 Essential (primary) hypertension: Secondary | ICD-10-CM | POA: Diagnosis not present

## 2021-10-19 DIAGNOSIS — Z1159 Encounter for screening for other viral diseases: Secondary | ICD-10-CM

## 2021-10-19 DIAGNOSIS — N183 Chronic kidney disease, stage 3 unspecified: Secondary | ICD-10-CM

## 2021-10-19 MED ORDER — TAMSULOSIN HCL 0.4 MG PO CAPS
ORAL_CAPSULE | ORAL | 6 refills | Status: DC
  Filled 2021-10-19 – 2021-11-22 (×2): qty 60, 30d supply, fill #0
  Filled 2022-01-15: qty 60, 30d supply, fill #1
  Filled 2022-02-18: qty 60, 30d supply, fill #2
  Filled 2022-03-25: qty 60, 30d supply, fill #3
  Filled 2022-04-30: qty 60, 30d supply, fill #4
  Filled 2022-08-21: qty 60, 30d supply, fill #5

## 2021-10-19 MED ORDER — TAMSULOSIN HCL 0.4 MG PO CAPS: 0.8000 mg | ORAL_CAPSULE | Freq: Every day | ORAL | 6 refills | Status: DC

## 2021-10-19 NOTE — Progress Notes (Signed)
? ? ?Patient ID: Keith Potter, male    DOB: November 28, 1965  MRN: [id number] ? ?CC: Hypertension ? ? ?Subjective: ?Keith Potter is a 56 y.o. 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  ?Elev PSA:  saw [facility].  Prostate bx done 08/2021.  Reports being told bx was negative. ?Has f/u in 02/2022. ?Still has frequent urination and stop and go flow.  Flomax has helped some.  ? ?Seen in ER earlier this mth for CP.  Work up negative including CTA chest for P.E ?No further CP.  Pt thinks was muscle spasm. ?He had also completed his work-up for chest pain is with cardiology several months ago with negative nuclear study and normal echo. ? ?HTN:  taking Norvasc consistently.  No further chest pains.  No shortness of breath or lower extremity edema.  He limits salt in the foods. ?Out of atorvastatin.  He does not realize that he had refills sent by the cardiology Dr. Harl Bowie last month.  He plans to pick up the prescription today. ? ?PreDM/Obesity:  He stopped eating fried and greasy foods.  Now bake his meat.  He has cut back on sugary drinks.  Eating more fruits and veggies ?-did see nutritionist and found it helpful ? ?Hx DVT/PE: Reports compliance with Xarelto.  No bruising or bleeding. ? ?CKD:  eGFR dec a little more on recent blood test done through the emergency room.  GFR was 52.  In November of last year it was 22.  Creatinine has ranged from 1.25-1.54.  He is not on any NSAIDs. ? ?HM: Due for tetanus and shingles vaccine.  He is agreeable to receiving both.  Due for hepatitis C screening. ?He has form for dental clearance.  He will be having some extractions, cleaning and and root canal ?Patient Active Problem List  ? Diagnosis Date Noted   ? Prediabetes 05/12/2021  ? Elevated PSA 05/12/2021  ? Former smoker 05/05/2021  ? Essential hypertension 05/05/2021  ? Tobacco dependence 04/23/2019  ? Marijuana user 04/23/2019  ? Rectal bleed 04/23/2019  ? History of pulmonary embolism 04/23/2019  ? DVT (deep venous thrombosis) ([facility]) 03/31/2019  ? Pulmonary emboli ([place] [facility]) 03/27/2019  ? Hyperlipidemia 01/10/2019  ? Healthcare maintenance 01/10/2019  ? Pulmonary embolism ([facility]) 12/28/2018  ? History of DVT (deep vein thrombosis) 12/28/2018  ? 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  ? Chronic pain of toe, right 06/06/2016  ? Deformity of toe, right 06/06/2016  ? Increased frequency of urination 06/09/2013  ? Nocturia 06/09/2013  ?  ? ?Current Outpatient Medications on File Prior to Visit  ?Medication Sig Dispense Refill  ? amLODipine (NORVASC) 5 MG tablet Take 1 tablet (5 mg total) by mouth daily. 30 tablet 3  ? Ascorbic Acid (VITAMIN C) 1000 MG tablet Take 1,000 mg by mouth daily.    ? atorvastatin (LIPITOR) 80 MG tablet Take 1 tablet (80 mg total) by mouth daily. 90 tablet 3  ? cyclobenzaprine (FLEXERIL) 10 MG tablet Take 1 tablet (10 mg total) by mouth 3 (three) times daily as needed for muscle spasms. 30 tablet 1  ? methylPREDNISolone (MEDROL) 4 MG tablet 6 day taper to be taken as directed. 21 tablet 0  ? Multiple Vitamin (  MULTIVITAMIN WITH MINERALS) TABS tablet Take 1 tablet by mouth daily.    ? pregabalin (LYRICA) 100 MG capsule Take 1 capsule (100 mg total) by mouth 2 (two) times daily. 60 capsule 0  ? rivaroxaban (XARELTO) 20 MG TABS tablet TAKE 1 TABLET BY MOUTH EVERY DAY WITH SUPPER 30 tablet 2  ? traMADol (ULTRAM) 50 MG tablet Take 2 tablets (100 mg total) by mouth every 6 (six) hours as needed. 30 tablet 0  ? ?No current facility-administered medications on file prior to visit.  ? ? ?No Known Allergies ? ?Social History  ? ?Socioeconomic History  ? Marital status: Single  ?  Spouse name: Not on file  ? Number of children: Not on file  ? Years of  education: Not on file  ? Highest education level: Not on file  ?Occupational History  ? Not on file  ?Tobacco Use  ? Smoking status: Former  ?  Packs/day: 0.50  ?  Types: Cigarettes  ? Smokeless tobacco: Never  ?Vaping Use  ? Vaping Use: Never used  ?Substance and Sexual Activity  ? Alcohol use: Not Currently  ? Drug use: Yes  ?  Types: Marijuana  ? Sexual activity: Yes  ?  Partners: Female  ?  Birth control/protection: Condom  ?Other Topics Concern  ? Not on file  ?Social History Narrative  ? Not on file  ? ?Social Determinants of Health  ? ?Financial Resource Strain: Not on file  ?Food Insecurity: Not on file  ?Transportation Needs: Unmet Transportation Needs  ? Lack of Transportation (Medical): Yes  ? Lack of Transportation (Non-Medical): Yes  ?Physical Activity: Not on file  ?Stress: Not on file  ?Social Connections: Not on file  ?Intimate Partner Violence: Not on file  ? ? ?Family History  ?Problem Relation Age of Onset  ? Diabetes Mother   ? Diabetes Father   ? ? ?Past Surgical History:  ?Procedure Laterality Date  ? AMPUTATION TOE    ? APPENDECTOMY    ? HERNIA REPAIR    ? ? ?ROS: ?Review of Systems ?Negative except as stated above ? ?PHYSICAL EXAM: ?BP 114/79   Pulse 69   Ht _0  (1.854 m)   Wt 237 lb (107.5 kg)   SpO2 96%   BMI 31.27 kg/m?   ?Wt Readings from Last 3 Encounters:  ?10/19/21 237 lb (107.5 kg)  ?10/08/21 232 lb 9.6 oz (105.5 kg)  ?09/22/21 239 lb (108.4 kg)  ? ? ?Physical Exam ? ? ?General appearance - alert, well appearing, older African-American male.  And in no distress ?Mental status - normal mood, behavior, speech, dress, motor activity, and thought processes ?Neck - supple, no significant adenopathy ?Chest - clear to auscultation, no wheezes, rales or rhonchi, symmetric air entry ?Heart - normal rate, regular rhythm, normal S1, S2, no murmurs, rubs, clicks or gallops ?Extremities - peripheral pulses normal, no pedal edema, no clubbing or cyanosis ? ? ?  Latest Ref Rng & Units  09/22/2021  ?  4:05 PM 05/07/2021  ?  9:38 AM 04/01/2019  ?  4:27 AM  ?CMP  ?Glucose 70 - 99 mg/dL 113   138   106    ?BUN 6 - 20 mg/dL _1 ?Creatinine 0.61 - 1.24 mg/dL 1.54   1.30   1.25    ?Sodium 135 - 145 mmol/L 140   140   142    ?Potassium 3.5 - 5.1 mmol/L 3.9   4.1  3.7    ?Chloride 98 - 111 mmol/L 107   105   108    ?CO2 22 - 32 mmol/L _0 ?Calcium 8.9 - 10.3 mg/dL 9.8   9.5   9.1    ?Total Protein 6.0 - 8.5 g/dL  7.6   6.4    ?Total Bilirubin 0.0 - 1.2 mg/dL  0.8   0.8    ?Alkaline Phos 44 - 121 IU/L  125   88    ?AST 0 - 40 IU/L  28   18    ?ALT 0 - 44 IU/L  33   18    ? ?Lipid Panel  ?   ?Component Value Date/Time  ? CHOL 289 (H) 05/07/2021 8979  ? TRIG 85 05/07/2021 0938  ? HDL 38 (L) 05/07/2021 1504  ? CHOLHDL 7.6 (H) 05/07/2021 1364  ? CHOLHDL 5.5 12/28/2018 2042  ? VLDL 15 12/28/2018 2042  ? LDLCALC 237 (H) 05/07/2021 3837  ? ? ?CBC ?   ?Component Value Date/Time  ? WBC 11.8 (H) 09/22/2021 1605  ? RBC 5.18 09/22/2021 1605  ? HGB 16.0 09/22/2021 1605  ? HGB 17.0 05/07/2021 0938  ? HCT 47.0 09/22/2021 1605  ? HCT 49.6 05/07/2021 0938  ? PLT 209 09/22/2021 1605  ? PLT 166 05/07/2021 0938  ? MCV 90.7 09/22/2021 1605  ? MCV 92 05/07/2021 0938  ? MCH 30.9 09/22/2021 1605  ? MCHC 34.0 09/22/2021 1605  ? RDW 12.8 09/22/2021 1605  ? RDW 12.7 05/07/2021 0938  ? LYMPHSABS 2.6 09/22/2021 1605  ? MONOABS 0.9 09/22/2021 1605  ? EOSABS 0.1 09/22/2021 1605  ? BASOSABS 0.1 09/22/2021 1605  ? ? ?ASSESSMENT AND PLAN: ?1. Essential hypertension ?At goal.  Continue Norvasc. ?- Basic Metabolic Panel ? ?2. Hyperlipidemia, unspecified hyperlipidemia type ?Patient to pick up Lipitor.  He is on 80 mg daily.  Plan to recheck lipid profile on next visit. ? ?3. Elevated PSA ?Seen by [facility].  Reports having negative prostate biopsy. ? ?4. Benign prostatic hyperplasia with urinary frequency ?We will try increasing Flomax 0.8 mg daily ?- tamsulosin (FLOMAX) 0.4 MG CAPS capsule; Take 2 capsules (0.8  mg total) by mouth daily.  Dispense: 60 capsule; Refill: 6 ? ?5. Stage 3 chronic kidney disease, unspecified whether stage 3a or 3b CKD ([place]) ?? Of whether this is CKD vs postrenal insuff assoc with BPH ?Avoid NSAIDs

## 2021-10-20 ENCOUNTER — Other Ambulatory Visit: Payer: Medicaid Other

## 2021-10-20 LAB — BASIC METABOLIC PANEL
BUN/Creatinine Ratio: 8 — ABNORMAL LOW (ref 9–20)
BUN: 7 mg/dL (ref 6–24)
CO2: 23 mmol/L (ref 20–29)
Calcium: 9.4 mg/dL (ref 8.7–10.2)
Chloride: 107 mmol/L — ABNORMAL HIGH (ref 96–106)
Creatinine, Ser: 0.88 mg/dL (ref 0.76–1.27)
Glucose: 99 mg/dL (ref 70–99)
Potassium: 4.3 mmol/L (ref 3.5–5.2)
Sodium: 143 mmol/L (ref 134–144)
eGFR: 100 mL/min/{1.73_m2} (ref >59)

## 2021-10-20 LAB — MICROALBUMIN / CREATININE URINE RATIO
Creatinine, Urine: 287.2 mg/dL
Microalb/Creat Ratio: 14 mg/g creat (ref 0–29)
Microalbumin, Urine: 40.5 ug/mL

## 2021-10-20 LAB — HCV INTERPRETATION

## 2021-10-20 LAB — HCV AB W REFLEX TO QUANT PCR: HCV Ab: NONREACTIVE

## 2021-10-22 ENCOUNTER — Telehealth: Payer: Self-pay

## 2021-10-22 NOTE — Telephone Encounter (Signed)
Contacted pt to go over lab results pt is aware and doesn't have any questions or concerns 

## 2021-10-24 ENCOUNTER — Ambulatory Visit: Payer: Medicaid Other | Admitting: Specialist

## 2021-10-31 ENCOUNTER — Ambulatory Visit (INDEPENDENT_AMBULATORY_CARE_PROVIDER_SITE_OTHER): Payer: Medicaid Other | Admitting: Specialist

## 2021-10-31 ENCOUNTER — Encounter: Payer: Self-pay | Admitting: Specialist

## 2021-10-31 ENCOUNTER — Ambulatory Visit (INDEPENDENT_AMBULATORY_CARE_PROVIDER_SITE_OTHER): Payer: Medicaid Other

## 2021-10-31 VITALS — BP 127/76 | HR 74 | Ht 73.0 in | Wt 237.0 lb

## 2021-10-31 DIAGNOSIS — R29898 Other symptoms and signs involving the musculoskeletal system: Secondary | ICD-10-CM | POA: Diagnosis not present

## 2021-10-31 DIAGNOSIS — M5136 Other intervertebral disc degeneration, lumbar region: Secondary | ICD-10-CM

## 2021-10-31 DIAGNOSIS — M25551 Pain in right hip: Secondary | ICD-10-CM

## 2021-10-31 DIAGNOSIS — M5416 Radiculopathy, lumbar region: Secondary | ICD-10-CM | POA: Diagnosis not present

## 2021-10-31 DIAGNOSIS — M4726 Other spondylosis with radiculopathy, lumbar region: Secondary | ICD-10-CM

## 2021-10-31 NOTE — Progress Notes (Signed)
? ?Office Visit Note ?  ?Patient: Keith Potter           ?Date of Birth: 05/04/1966           ?MRN: [id number] ?Visit Date: 10/31/2021 ?             ?Requested by: Marcine Matar, MD ?[address] ?[address] ?[place],  [place] [address] ?PCP: Marcine Matar, MD ? ? ?Assessment & Plan: ?Visit Diagnoses:  ?1. Pain in right hip   ?2. Radiculopathy, lumbar region   ?3. Degenerative disc disease, lumbar   ?4. Weakness of left leg   ?5. Other spondylosis with radiculopathy, lumbar region   ? ? ?Plan: Avoid bending, stooping and avoid lifting weights greater than 10 lbs. ?Avoid prolong standing and walking. ?Avoid frequent bending and stooping  ?No lifting greater than 10 lbs. ?May use ice or moist heat for pain. ?Weight loss is of benefit. ?Handicap license is approved. ? MRI of the lumbar spine to assess for right L5 nerve compression ? ?Follow-Up Instructions: Return in about 3 weeks (around 11/21/2021).  ? ?Orders:  ?Orders Placed This Encounter  ?Procedures  ? XR HIP UNILAT W OR W/O PELVIS 2-3 VIEWS RIGHT  ? MR Lumbar Spine w/o contrast  ? ?No orders of the defined types were placed in this encounter. ? ? ? ? Procedures: ?No procedures performed ? ? ?Clinical Data: ?No additional findings. ? ? ?Subjective: ?Chief Complaint  ?Patient presents with  ? Lower Back - Follow-up  ? ? ?56 year old male with history of prostate hypertrophy and enlargement and he is taking flomax for improving voiding. No bowel or bladder difficulties. Has increased ?Need for voiding. He reports doubling of dose and every 5 min dose increased to BID. He had pain in the right PSIS. Has some weakness right leg. Last seen and he reports that he is not able to work due to his pain in his back. 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 He reports the walking and pulling and standing make the job difficult. He was to undergo MRI but  ?His insurance company has not approved the MRI. He completed therapy went  7 times, last in late January and the pain is still present and limiting his ability to stand and walk. This is mainly pain when he gets up stands too long and with sitting too long. He has to sit when standing is pain. He has sometimes difficulty with stairs and with donning his shoes and socks.  ? ? ?Review of Systems  ?Constitutional: Negative.   ?HENT: Negative.    ?Eyes: Negative.   ?Respiratory: Negative.    ?Cardiovascular: Negative.   ?Gastrointestinal: Negative.   ?Endocrine: Negative.   ?Genitourinary: Negative.   ?Musculoskeletal: Negative.   ?Skin: Negative.   ?Allergic/Immunologic: Negative.   ?Neurological: Negative.   ?Hematological: Negative.   ?Psychiatric/Behavioral: Negative.    ? ? ?Objective: ?Vital Signs: BP 127/76 (BP Location: Left Arm, Patient Position: Sitting)   Pulse 74   Ht 6\' 1"  (1.854 m)   Wt 237 lb (107.5 kg)   BMI 31.27 kg/m?  ? ?Physical Exam ?Constitutional:   ?   Appearance: He is well-developed.  ?HENT:  ?   Head: Normocephalic and atraumatic.  ?Eyes:  ?   Pupils: Pupils are equal, round, and reactive to light.  ?Pulmonary:  ?   Effort: Pulmonary effort is normal.  ?   Breath sounds: Normal breath  sounds.  ?Abdominal:  ?   General: Bowel sounds are normal.  ?   Palpations: Abdomen is soft.  ?Musculoskeletal:  ?   Cervical back: Normal range of motion and neck supple.  ?   Lumbar back: Negative right straight leg raise test and negative left straight leg raise test.  ?Skin: ?   General: Skin is warm and dry.  ?Neurological:  ?   Mental Status: He is alert and oriented to person, place, and time.  ?Psychiatric:     ?   Behavior: Behavior normal.     ?   Thought Content: Thought content normal.     ?   Judgment: Judgment normal.  ? ? ?Back Exam  ? ?Tenderness  ?The patient is experiencing tenderness in the lumbar. ? ?Range of Motion  ?Extension:  abnormal  ?Flexion:  abnormal  ?Lateral bend right:  abnormal  ?Rotation right:  abnormal  ?Rotation left:  abnormal  ? ?Muscle  Strength  ?Right Quadriceps:  5/5  ?Left Quadriceps:  5/5  ?Right Hamstrings:  5/5  ?Left Hamstrings:  5/5  ? ?Tests  ?Straight leg raise right: negative ?Straight leg raise left: negative ? ?Reflexes  ?Patellar:  2/4 ?Achilles:  2/4 ? ?Comments:  Right EHL is weak, right foot DF weak 4/5.  ? ? ? ? ?Specialty Comments:  ?No specialty comments available. ? ?Imaging: ?XR HIP UNILAT W OR W/O PELVIS 2-3 VIEWS RIGHT ? ?Result Date: 10/31/2021 ?AP standing and lateral radiographs of the right hip with mild superolateral and inferior acetabular osteophytes, central femoral osteophyte with mild joing line narrowing, no loss of sphericity. Mild cam effect of the lateral head-neck interval consistent with femoralacetabular impingement.   ? ? ?PMFS History: ?Patient Active Problem List  ? Diagnosis Date Noted  ? Prediabetes 05/12/2021  ? Elevated PSA 05/12/2021  ? Former smoker 05/05/2021  ? Essential hypertension 05/05/2021  ? Tobacco dependence 04/23/2019  ? Marijuana user 04/23/2019  ? Rectal bleed 04/23/2019  ? History of pulmonary embolism 04/23/2019  ? DVT (deep venous thrombosis) ([facility]) 03/31/2019  ? Pulmonary emboli ([facility]) 03/27/2019  ? Hyperlipidemia 01/10/2019  ? Healthcare maintenance 01/10/2019  ? Pulmonary embolism ([facility]) 12/28/2018  ? History of DVT (deep vein thrombosis) 12/28/2018  ? 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  ? Chronic pain of toe, right 06/06/2016  ? Deformity of toe, right 06/06/2016  ? Increased frequency of urination 06/09/2013  ? Nocturia 06/09/2013  ? ?Past Medical History:  ?Diagnosis Date  ? DVT (deep venous thrombosis) ([facility])   ? Hypertension   ? Prediabetes   ? Pulmonary emboli ([facility])   ?  ?Family History  ?Problem Relation Age of Onset  ? Diabetes Mother   ? Diabetes Father   ?  ?Past Surgical History:  ?Procedure Laterality Date  ? AMPUTATION TOE    ? APPENDECTOMY    ? HERNIA REPAIR    ? ?Social History  ? ?Occupational History  ? Not on file  ?Tobacco Use  ? Smoking status: Former  ?   Packs/day: 0.50  ?  Types: Cigarettes  ?  Quit date: 01/31/2021  ?  Years since quitting: 0.7  ? Smokeless tobacco: Never  ?Vaping Use  ? Vaping Use: Never used  ?Substance and Sexual Activity  ? Alcohol use: Not Currently  ? Drug use: Yes  ?  Types: Marijuana  ? Sexual activity: Yes  ?  Partners: Female  ?  Birth control/protection: Condom  ? ? ? ? ? ? ?

## 2021-10-31 NOTE — Patient Instructions (Addendum)
Avoid bending, stooping and avoid lifting weights greater than 10 lbs. ?Avoid prolong standing and walking. ?Avoid frequent bending and stooping  ?No lifting greater than 10 lbs. ?May use ice or moist heat for pain. ?Weight loss is of benefit. ?Handicap license is approved. ? MRI of the lumbar spine to assess for right L5 nerve compression  ?

## 2021-11-22 ENCOUNTER — Ambulatory Visit: Payer: Medicaid Other | Attending: Internal Medicine | Admitting: Pharmacist

## 2021-11-22 ENCOUNTER — Other Ambulatory Visit: Payer: Self-pay

## 2021-11-22 DIAGNOSIS — Z23 Encounter for immunization: Secondary | ICD-10-CM | POA: Diagnosis not present

## 2021-11-22 NOTE — Progress Notes (Signed)
Patient presents for vaccination against zoster per orders of Dr. Johnson. Consent given. Counseling provided. No contraindications exists. Vaccine administered without incident.   Luke Van Ausdall, PharmD, BCACP, CPP Clinical Pharmacist [facility] [phone]  

## 2021-11-23 ENCOUNTER — Ambulatory Visit: Payer: Medicaid Other | Admitting: Internal Medicine

## 2021-11-23 ENCOUNTER — Ambulatory Visit (INDEPENDENT_AMBULATORY_CARE_PROVIDER_SITE_OTHER): Payer: Medicaid Other | Admitting: Internal Medicine

## 2021-11-23 ENCOUNTER — Encounter: Payer: Self-pay | Admitting: Internal Medicine

## 2021-11-23 VITALS — BP 120/80 | HR 59 | Ht 73.0 in | Wt 238.8 lb

## 2021-11-23 DIAGNOSIS — E785 Hyperlipidemia, unspecified: Secondary | ICD-10-CM

## 2021-11-23 NOTE — Progress Notes (Signed)
Cardiology Office Note:    Date:  11/23/2021   ID:  Keith Potter, DOB 03/16/1966, MRN [id number]  PCP:  Marcine Matar, MD   [facility] Providers Cardiologist:  Maisie Fus, MD     Referring MD: Marcine Matar, MD   No chief complaint on file.  Originally Pre-Op Evaluation, follow up  History of Present Illness:    Keith Potter is a 56 y.o. 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 He said it was at night. He just finished eating. He had to stand up and stretch. It went away and then returned. He has a sharp pain/ and cramping. During the day, he doesn't do to much during the day. He walks to the store, he does not notice it. He gets some shortness of breath with walking. This has been 2-3 weeks. He lost his toes in a work accident.   He has not seen a cardiologist. He has no hx of stress test or LHC.   CVD Risk/Equivalent: HLD- yes  on atorvastatin HTN- yes, on norvasc 5 mg  PAD- no DMII -  pre diabetes A1c 5.7 Smoker- smoked cigarettes for 30 years, quit 3 months ago Premature Family History- No  Interim Hx 08/24/2021: Don't see documentation, per patient hammertoe surgery canceled 2/2 high LDL and blood pressure. Blood pressures typically well controlled. He had normal lexiscan stress ; EF <50 unclear if accurate. Echo showed low normal function.   Interim Hx 11/23/2021: Today Keith Potter presents for follow-up. He has not undergone surgery, he's concerned. His blood pressures are in good conrol today. They have been well controlled in April and May.   Past Medical History:  Diagnosis Date   DVT (deep venous thrombosis) (HCC)    Hypertension    Prediabetes    Pulmonary emboli [facility])     Past Surgical History:  Procedure Laterality Date   AMPUTATION TOE     APPENDECTOMY      HERNIA REPAIR      Current Medications: No outpatient medications have been marked as taking for the 11/23/21 encounter (Appointment) with Maisie Fus, MD.     Allergies:   Patient has no known allergies.   Social History   Socioeconomic History   Marital status: Single    Spouse name: Not on file   Number of children: Not on file   Years of education: Not on file   Highest education level: Not on file  Occupational History   Not on file  Tobacco Use   Smoking status: Former    Packs/day: 0.50    Types: Cigarettes    Quit date: 01/31/2021    Years since quitting: 0.8   Smokeless tobacco: Never  Vaping Use   Vaping Use: Never used  Substance and Sexual Activity   Alcohol use: Not Currently   Drug use: Yes    Types: Marijuana   Sexual activity: Yes    Partners: Female    Birth control/protection: Condom  Other Topics Concern   Not on file  Social History Narrative   Not on file   Social Determinants of Health   Financial Resource Strain: Not on file  Food Insecurity: Not on file  Transportation Needs: Unmet Transportation Needs   Lack of Transportation (Medical):  Yes   Lack of Transportation (Non-Medical): Yes  Physical Activity: Not on file  Stress: Not on file  Social Connections: Not on file     Family History: The patient's family history includes Diabetes in his father and mother.  ROS:   Please see the history of present illness.     All other systems reviewed and are negative.  EKGs/Labs/Other Studies Reviewed:    The following studies were reviewed today:   EKG:  EKG is  ordered today.  The ekg ordered today demonstrates   NSR HR 71 bpm, Qtc 402 ms  11/23/2021- sinus bradycardia 59 bpm and 1st degree AV block  Recent Labs: 05/07/2021: ALT 33 09/22/2021: B Natriuretic Peptide 22.9; Hemoglobin 16.0; Platelets 209 10/19/2021: BUN 7; Creatinine, Ser 0.88; Potassium 4.3; Sodium 143  Recent Lipid Panel    Component Value Date/Time   CHOL 289 (H)  05/07/2021 0938   TRIG 85 05/07/2021 0938   HDL 38 (L) 05/07/2021 0938   CHOLHDL 7.6 (H) 05/07/2021 0938   CHOLHDL 5.5 12/28/2018 2042   VLDL 15 12/28/2018 2042   LDLCALC 237 (H) 05/07/2021 0938     Risk Assessment/Calculations:     The 10-year ASCVD risk score (Arnett DK, et al., 2019) is: 14.7%   Values used to calculate the score:     Age: 89 years     Sex: Male     Is Non-Hispanic African American: Yes     Diabetic: No     Tobacco smoker: No     Systolic Blood Pressure: 127 mmHg     Is BP treated: Yes     HDL Cholesterol: 38 mg/dL     Total Cholesterol: 289 mg/dL       Physical Exam:    VS:   There were no vitals filed for this visit.    Wt Readings from Last 3 Encounters:  10/31/21 237 lb (107.5 kg)  10/19/21 237 lb (107.5 kg)  10/08/21 232 lb 9.6 oz (105.5 kg)     GEN:  Well nourished, well developed in no acute distress HEENT: moist mucous membranes LYMPHATICS: No lymphadenopathy CARDIAC: RRR, no murmurs, rubs, gallops RESPIRATORY:  Clear to auscultation without rales, wheezing or rhonchi  ABDOMEN: Soft, non-tender, non-distended MUSCULOSKELETAL:  No edema; No deformity  SKIN: Warm and dry NEUROLOGIC:  Alert and oriented x 3 PSYCHIATRIC:  Normal affect   ASSESSMENT:    #Pre-op: He has not followed up for the procedure due to apprehension about surgery, if he changes his mind, he has acceptable cardiac risk for severe hammertoe contracture deformity surgery. He had a normal lexiscan stress and echo. Blood pressure elevated sporadically, now consistently controlled. Xarelto can be held for the procedure for any duration.   HTN-  He has normal blood pressures today doing well. He is managed with intermittent medrol which can contribute. Recommend continued ambulatory blood pressure monitoring. Continue norvasc 5 mg daily. BP goal < 130/80 mmHg. If consistently elevated in the future, recommend increasing his norvasc and adding chlorthalidone 25 mg  daily.  HLD:  Patient has moderate ASCVD risk. LDL 237- Increased atorvastatin to 80 mg daily in March. LDL goal at least < 100 mg/dL. FH is possible in this range. Will aim to be aggressive with therapy.   PLAN:    In order of problems listed above:  Fasting lipid profile today Follow up one year     Signed, Maisie Fus, MD  11/23/2021 7:40 AM    [facility]

## 2021-11-23 NOTE — Patient Instructions (Signed)
Medication Instructions:  No Changes In Medications at this time.  *If you need a refill on your cardiac medications before your next appointment, please call your pharmacy*  Lab Work:  Please return for FASTING Blood Work in 1 WEEK. No appointment needed, lab here at the office is open Monday-Friday from 8AM to 4PM and closed daily for lunch from 12:45-1:45.   If you have labs (blood work) drawn today and your tests are completely normal, you will receive your results only by: MyChart Message (if you have MyChart) OR A paper copy in the mail If you have any lab test that is abnormal or we need to change your treatment, we will call you to review the results.  Testing/Procedures: None Ordered At This Time.   Follow-Up: At [facility], you and your health needs are our priority.  As part of our continuing mission to provide you with exceptional heart care, we have created designated Provider Care Teams.  These Care Teams include your primary Cardiologist (physician) and Advanced Practice Providers (APPs -  Physician Assistants and Nurse Practitioners) who all work together to provide you with the care you need, when you need it.  Your next appointment:   6 month(s)  The format for your next appointment:   In Person  Provider:   Maisie Fus, MD

## 2021-11-27 LAB — LIPID PANEL
Chol/HDL Ratio: 3.4 ratio (ref 0.0–5.0)
Cholesterol, Total: 120 mg/dL (ref 100–199)
HDL: 35 mg/dL — ABNORMAL LOW (ref >39)
LDL Chol Calc (NIH): 72 mg/dL (ref 0–99)
Triglycerides: 61 mg/dL (ref 0–149)
VLDL Cholesterol Cal: 13 mg/dL (ref 5–40)

## 2021-11-28 ENCOUNTER — Ambulatory Visit
Admission: RE | Admit: 2021-11-28 | Discharge: 2021-11-28 | Disposition: A | Discharge status: Home / Self Care | Payer: Medicaid Other | Source: Ambulatory Visit | Attending: Specialist | Admitting: Specialist

## 2021-11-28 DIAGNOSIS — M545 Low back pain, unspecified: Secondary | ICD-10-CM

## 2021-11-28 DIAGNOSIS — M5441 Lumbago with sciatica, right side: Secondary | ICD-10-CM

## 2021-11-28 DIAGNOSIS — M5136 Other intervertebral disc degeneration, lumbar region: Secondary | ICD-10-CM

## 2021-11-29 ENCOUNTER — Ambulatory Visit (INDEPENDENT_AMBULATORY_CARE_PROVIDER_SITE_OTHER): Payer: Medicaid Other | Admitting: Specialist

## 2021-11-29 ENCOUNTER — Encounter: Payer: Self-pay | Admitting: Specialist

## 2021-11-29 VITALS — BP 137/89 | HR 79 | Ht 73.0 in | Wt 239.0 lb

## 2021-11-29 DIAGNOSIS — R29898 Other symptoms and signs involving the musculoskeletal system: Secondary | ICD-10-CM

## 2021-11-29 DIAGNOSIS — M5416 Radiculopathy, lumbar region: Secondary | ICD-10-CM

## 2021-11-29 DIAGNOSIS — M5136 Other intervertebral disc degeneration, lumbar region: Secondary | ICD-10-CM | POA: Diagnosis not present

## 2021-11-29 DIAGNOSIS — M5441 Lumbago with sciatica, right side: Secondary | ICD-10-CM | POA: Diagnosis not present

## 2021-11-29 DIAGNOSIS — M4726 Other spondylosis with radiculopathy, lumbar region: Secondary | ICD-10-CM

## 2021-11-29 NOTE — Patient Instructions (Signed)
Avoid bending, stooping and avoid lifting weights greater than 10 lbs. Avoid prolong standing and walking. Order for a new walker with wheels. Surgery scheduling secretary Tivis Ringer, will call you in the next week to schedule for surgery.  Surgery recommended is a two level lumbar fusion right L5-S1 and right L4-5 this would be done with rods, screws and cages with local bone graft and allograft (donor bone graft). Take hydrocodone for for pain. Risk of surgery includes risk of infection 1 in 200 patients, bleeding 1/2% chance you would need a transfusion.   Risk to the nerves is one in 10,000. You will need to use a brace for 3 months and wean from the brace on the 4th month. Expect improved walking and standing tolerance. Expect relief of leg pain but numbness may persist depending on the length and degree of pressure that has been present.

## 2021-11-29 NOTE — Progress Notes (Signed)
Office Visit Note   Patient: Keith Potter           Date of Birth: 10-03-1965           MRN: [id number] Visit Date: 11/29/2021              Requested by: Marcine Matar, MD [address] [place] [address] [place],  [place] [address] PCP: Marcine Matar, MD   Assessment & Plan: Visit Diagnoses:  1. Degenerative disc disease, lumbar   2. Weakness of left leg   3. Radiculopathy, lumbar region   4. Low back pain with right-sided sciatica, unspecified back pain laterality, unspecified chronicity   5. Other spondylosis with radiculopathy, lumbar region     Plan: void bending, stooping and avoid lifting weights greater than 10 lbs. Avoid prolong standing and walking. Order for a new walker with wheels. Surgery scheduling secretary Tivis Ringer, will call you in the next week to schedule for surgery.  Surgery recommended is a two level lumbar fusion right L5-S1 and right L4-5 this would be done with rods, screws and cages with local bone graft and allograft (donor bone graft). Take hydrocodone for for pain. Risk of surgery includes risk of infection 1 in 200 patients, bleeding 1/2% chance you would need a transfusion.   Risk to the nerves is one in 10,000. You will need to use a brace for 3 months and wean from the brace on the 4th month. Expect improved walking and standing tolerance. Expect relief of leg pain but numbness may persist depending on the length and degree of pressure that has been present.    Follow-Up Instructions: No follow-ups on file.   Orders:  No orders of the defined types were placed in this encounter.  No orders of the defined types were placed in this encounter.     Procedures: No procedures performed   Clinical Data: Findings:  Narrative & Impression CLINICAL DATA:  Low back pain, symptoms persist with greater than 6 weeks treatment. Right-sided pain. Pain radiates to the left leg.   EXAM: MRI LUMBAR SPINE WITHOUT CONTRAST    TECHNIQUE: Multiplanar, multisequence MR imaging of the lumbar spine was performed. No intravenous contrast was administered.   COMPARISON:  Radiography 04/04/2021   FINDINGS: Segmentation:  5 lumbar type vertebral bodies.   Alignment:  Normal   Vertebrae:  No fracture or focal bone lesion.   Conus medullaris and cauda equina: Conus extends to the T12-L1 level. Conus and cauda equina appear normal.   Paraspinal and other soft tissues: Negative   Disc levels:   No significant finding at L4-5 or above. Minimal disc bulges. No compressive stenosis of the canal or foramina. Mild facet hypertrophy only at the L4-5 level but without edema or effusions.   L5-S1: More advanced disc degeneration with loss of disc height. Endplate osteophytes and bulging of the disc. No canal stenosis. Bilateral facet osteoarthritis. Bilateral foraminal narrowing that could possibly affect either L5 nerve.   IMPRESSION: 1. L5-S1: disc degeneration with loss of disc height. Endplate osteophytes and bulging of the disc. Bilateral facet osteoarthritis. Bilateral foraminal narrowing that could possibly affect either L5 nerve. The facet arthropathy could be a cause of back pain or referred facet syndrome pain. 2. L4-5: Minimal disc bulge. Mild facet hypertrophy. No compressive stenosis. The facet arthritis could possibly contribute to symptoms.     Electronically Signed   By: Paulina Fusi M.D.   On: 11/29/2021 08:16  Subjective: No chief complaint on file.   56 year old male with greater than 3 year history of lumbar pain that is lumbosacral and radiates into the right leg posterolateral. The pain is such that he stopped work about 3 years ago and he has been experiencing constant pain into the back with radiation into the right leg. He has prostate enlargment and is taking flomax for this.  Pain is some better with gabapentin about a 6, ESI did seem to decrease the pain right leg. No  bowel or bladder difficulties, He has pain that if impairing his ability to stand and walk and can't stand or sit to long. Had a crush injury to the right foot and injured the right great toe this happened years ago while working for [employer] and had a partial amputation of the right great toe with crush injury. This was treated at [facility].   Review of Systems  Constitutional: Negative.   HENT: Negative.    Eyes: Negative.   Respiratory: Negative.    Cardiovascular: Negative.   Gastrointestinal: Negative.   Endocrine: Negative.   Genitourinary: Negative.   Musculoskeletal: Negative.   Skin: Negative.   Allergic/Immunologic: Negative.   Neurological: Negative.   Hematological: Negative.   Psychiatric/Behavioral: Negative.       Objective: Vital Signs: BP 137/89 (BP Location: Left Arm, Patient Position: Sitting, Cuff Size: Large)   Pulse 79   Ht 6\' 1"  (1.854 m)   Wt 239 lb (108.4 kg)   BMI 31.53 kg/m   Physical Exam Musculoskeletal:     Lumbar back: Positive right straight leg raise test and positive left straight leg raise test.    Back Exam   Tenderness  The patient is experiencing tenderness in the lumbar.  Range of Motion  Extension:  abnormal  Flexion:  abnormal  Lateral bend right:  abnormal  Lateral bend left:  abnormal  Rotation right:  abnormal  Rotation left:  abnormal   Muscle Strength  Right Quadriceps:  5/5  Left Quadriceps:  5/5  Right Hamstrings:  5/5  Left Hamstrings:  5/5   Tests  Straight leg raise right: positive Straight leg raise left: positive  Reflexes  Patellar:  1/4 Achilles:  2/4  Other  Toe walk: abnormal Heel walk: abnormal Erythema: no back redness Scars: absent  Comments:  Tenderness right residual great toe and weakness in the right EHL at the MTP joint.     Specialty Comments:  No specialty comments available.  Imaging: No results found.   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   Chronic pain of toe, right 06/06/2016   Deformity of toe, right 06/06/2016   Increased frequency of urination 06/09/2013   Nocturia 06/09/2013   Past Medical History:  Diagnosis Date   DVT (deep venous thrombosis) (HCC)    Hypertension    Prediabetes    Pulmonary emboli (HCC)     Family History  Problem Relation Age of  Onset   Diabetes Mother    Diabetes Father     Past Surgical History:  Procedure Laterality Date   AMPUTATION TOE     APPENDECTOMY     HERNIA REPAIR     Social History   Occupational History   Not on file  Tobacco Use   Smoking status: Former    Packs/day: 0.50    Types: Cigarettes    Quit date: 01/31/2021    Years since quitting: 0.8   Smokeless tobacco: Never  Vaping Use   Vaping Use: Never used  Substance and Sexual Activity   Alcohol use: Not Currently   Drug use: Yes    Types: Marijuana   Sexual activity: Yes    Partners: Female    Birth control/protection: Condom

## 2021-12-08 ENCOUNTER — Other Ambulatory Visit: Payer: Self-pay | Admitting: Specialist

## 2021-12-11 ENCOUNTER — Other Ambulatory Visit: Payer: Self-pay | Admitting: Specialist

## 2021-12-11 MED ORDER — PREGABALIN 100 MG PO CAPS: 100.0000 mg | ORAL_CAPSULE | Freq: Two times a day (BID) | ORAL | 0 refills | Status: DC

## 2022-01-15 ENCOUNTER — Telehealth: Payer: Self-pay | Admitting: Specialist

## 2022-01-15 ENCOUNTER — Other Ambulatory Visit: Payer: Self-pay

## 2022-01-15 NOTE — Telephone Encounter (Signed)
Pt came up to the office because he has not been called for surgery and its been a month. Sherrie states she never got a surgery sheet and I didn't think Otelia Sergeant was doing surgeries anymore.   CB [phone]

## 2022-01-17 ENCOUNTER — Other Ambulatory Visit: Payer: Self-pay | Admitting: Specialist

## 2022-02-01 ENCOUNTER — Ambulatory Visit: Payer: Self-pay | Admitting: Specialist

## 2022-02-14 ENCOUNTER — Encounter: Payer: Self-pay | Admitting: Specialist

## 2022-02-14 ENCOUNTER — Ambulatory Visit (INDEPENDENT_AMBULATORY_CARE_PROVIDER_SITE_OTHER): Payer: Medicaid Other | Admitting: Specialist

## 2022-02-14 VITALS — BP 124/82 | HR 64 | Ht 73.0 in | Wt 239.0 lb

## 2022-02-14 DIAGNOSIS — R972 Elevated prostate specific antigen [PSA]: Secondary | ICD-10-CM

## 2022-02-14 DIAGNOSIS — M25551 Pain in right hip: Secondary | ICD-10-CM

## 2022-02-14 DIAGNOSIS — M5416 Radiculopathy, lumbar region: Secondary | ICD-10-CM

## 2022-02-14 DIAGNOSIS — R29898 Other symptoms and signs involving the musculoskeletal system: Secondary | ICD-10-CM

## 2022-02-14 DIAGNOSIS — M4726 Other spondylosis with radiculopathy, lumbar region: Secondary | ICD-10-CM

## 2022-02-14 DIAGNOSIS — M5136 Other intervertebral disc degeneration, lumbar region: Secondary | ICD-10-CM

## 2022-02-18 ENCOUNTER — Other Ambulatory Visit: Payer: Self-pay

## 2022-02-19 ENCOUNTER — Other Ambulatory Visit: Payer: Self-pay

## 2022-02-19 ENCOUNTER — Ambulatory Visit: Payer: Medicaid Other | Attending: Internal Medicine | Admitting: Internal Medicine

## 2022-02-19 ENCOUNTER — Encounter: Payer: Self-pay | Admitting: Internal Medicine

## 2022-02-19 VITALS — BP 128/79 | HR 63 | Ht 73.0 in | Wt 230.2 lb

## 2022-02-19 DIAGNOSIS — Z23 Encounter for immunization: Secondary | ICD-10-CM | POA: Diagnosis not present

## 2022-02-19 DIAGNOSIS — M545 Low back pain, unspecified: Secondary | ICD-10-CM

## 2022-02-19 DIAGNOSIS — I10 Essential (primary) hypertension: Secondary | ICD-10-CM | POA: Diagnosis not present

## 2022-02-19 DIAGNOSIS — K921 Melena: Secondary | ICD-10-CM | POA: Diagnosis not present

## 2022-02-19 DIAGNOSIS — D6859 Other primary thrombophilia: Secondary | ICD-10-CM | POA: Diagnosis not present

## 2022-02-19 DIAGNOSIS — M62830 Muscle spasm of back: Secondary | ICD-10-CM

## 2022-02-19 DIAGNOSIS — G8929 Other chronic pain: Secondary | ICD-10-CM

## 2022-02-19 DIAGNOSIS — E785 Hyperlipidemia, unspecified: Secondary | ICD-10-CM

## 2022-02-19 DIAGNOSIS — Z6830 Body mass index (BMI) 30.0-30.9, adult: Secondary | ICD-10-CM

## 2022-02-19 DIAGNOSIS — E669 Obesity, unspecified: Secondary | ICD-10-CM

## 2022-02-19 DIAGNOSIS — Z86010 Personal history of colonic polyps: Secondary | ICD-10-CM

## 2022-02-19 MED ORDER — CYCLOBENZAPRINE HCL 10 MG PO TABS
10.0000 mg | ORAL_TABLET | Freq: Two times a day (BID) | ORAL | 1 refills | Status: DC | PRN
  Filled 2022-02-19: qty 30, 15d supply, fill #0
  Filled 2022-04-30: qty 30, 15d supply, fill #1

## 2022-02-19 NOTE — Patient Instructions (Signed)
I recommend that you request your records from your orthopedic specialist Dr. Otelia Sergeant to send to your lawyer who is working with you to get disability based on your back issues.

## 2022-02-19 NOTE — Progress Notes (Signed)
Patient ID: Keith Potter, male    DOB: 1966/04/17  MRN: [id number]  CC: Chronic disease management  Subjective: Keith Potter is a 56 y.o. 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  On Norvasc 5 mg daily Med Adherence: [x]  Yes    []  No Medication side effects: []  Yes    [x]  No Adherence with salt restriction: [x]  Yes    []  No Home Monitoring?: []  Yes    []  No Monitoring Frequency:  Home BP results range:  SOB? [x]  Yes  when walking up hill Chest Pain?: []  Yes    [x]  No Leg swelling?: []  Yes    [x]  No Headaches?: []  Yes    [x]  No Dizziness? []  Yes    [x]  No Comments:   Hx DVT/PE: on Xarelto.  Reports blood in stools intermittently.  Episode yesterday.  Prior to that was 4-5 mths ago.  Last c-scop was 12/2016 in [facility]. .  Had several polyps removed.  Rec repeat c-scope in 3-5 yrs.  He would like to have this done in [place].  HL:  reports tolerating and taking Lipitor 80 mg daily.  Last LD 72  Obesity:  down 7 lbs since last visit 4 mths ago.  Reports he has cut out fried foods, eating more veggies, more lean meats.  Does exercise with rubber bands -  Request RF on Flexeril fro muscle spasms in the lower back back.  He has been seeing Dr. for his lower back issues.  Recent MRI of the lumbar spine done 11/2021 revealed osteoarthritis changes and bilateral foraminal narrowing that could possibly affect either the L5 nerve, L4-L5 minimal disc bulge with no compressive stenosis.  He is working on trying to get disability.  He has forms with him today from his lawyer. Patient Active Problem List   Diagnosis Date Noted    Prediabetes 05/12/2021   Elevated PSA 05/12/2021   Former smoker 05/05/2021   Essential hypertension 05/05/2021   Tobacco dependence 04/23/2019   Marijuana user 04/23/2019   Rectal bleed 04/23/2019   History of pulmonary embolism 04/23/2019   DVT (deep venous thrombosis) (HCC) 03/31/2019   Pulmonary emboli (HCC) 03/27/2019   Hyperlipidemia 01/10/2019   Healthcare maintenance 01/10/2019   Pulmonary embolism (HCC) 12/28/2018   History of DVT (deep vein thrombosis) 12/28/2018   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   Chronic pain of toe, right 06/06/2016   Deformity of toe, right 06/06/2016   Increased frequency of urination 06/09/2013   Nocturia 06/09/2013     Current Outpatient Medications on File Prior to Visit  Medication Sig Dispense Refill   amLODipine (NORVASC) 5 MG tablet Take 1 tablet (5 mg total) by mouth daily. 30 tablet 3   Ascorbic Acid (VITAMIN C) 1000 MG tablet Take 1,000 mg by mouth daily.     atorvastatin (LIPITOR) 80 MG tablet Take 1 tablet (80 mg total) by mouth daily. 90 tablet 3   cyclobenzaprine (FLEXERIL) 10 MG tablet Take 1 tablet (10 mg total) by mouth 3 (three) times daily as needed for muscle spasms. 30 tablet 1   methylPREDNISolone (MEDROL) 4 MG tablet 6 day taper to be taken  as directed. 21 tablet 0   Multiple Vitamin (MULTIVITAMIN WITH MINERALS) TABS tablet Take 1 tablet by mouth daily.     pregabalin (LYRICA) 100 MG capsule TAKE 1 CAPSULE BY MOUTH TWICE A DAY 60 capsule 0   rivaroxaban (XARELTO) 20 MG TABS tablet TAKE 1 TABLET BY MOUTH EVERY DAY WITH SUPPER 30 tablet 2   tamsulosin (FLOMAX) 0.4 MG CAPS capsule Take 2 capsules (0.8 mg total) by mouth daily. 60 capsule 6   tamsulosin (FLOMAX) 0.4 MG CAPS capsule take 2 caps by mouth daily 60 capsule 6   No current facility-administered medications on file prior to visit.    No Known Allergies  Social History   Socioeconomic History   Marital status: Single    Spouse name: Not on file   Number  of children: Not on file   Years of education: Not on file   Highest education level: Not on file  Occupational History   Not on file  Tobacco Use   Smoking status: Former    Packs/day: 0.50    Types: Cigarettes    Quit date: 01/31/2021    Years since quitting: 1.0   Smokeless tobacco: Never  Vaping Use   Vaping Use: Never used  Substance and Sexual Activity   Alcohol use: Not Currently   Drug use: Yes    Types: Marijuana   Sexual activity: Yes    Partners: Female    Birth control/protection: Condom  Other Topics Concern   Not on file  Social History Narrative   Not on file   Social Determinants of Health   Financial Resource Strain: Not on file  Food Insecurity: Not on file  Transportation Needs: Unmet Transportation Needs (08/24/2021)   PRAPARE - Administrator, Civil Service (Medical): Yes    Lack of Transportation (Non-Medical): Yes  Physical Activity: Not on file  Stress: Not on file  Social Connections: Not on file  Intimate Partner Violence: Not on file    Family History  Problem Relation Age of Onset   Diabetes Mother    Diabetes Father     Past Surgical History:  Procedure Laterality Date   AMPUTATION TOE     APPENDECTOMY     HERNIA REPAIR      ROS: Review of Systems Negative except as stated above  PHYSICAL EXAM: BP 128/79   Pulse 63   Ht 6\' 1"  (1.854 m)   Wt 230 lb 3.2 oz (104.4 kg)   SpO2 97%   BMI 30.37 kg/m   Wt Readings from Last 3 Encounters:  02/19/22 230 lb 3.2 oz (104.4 kg)  02/14/22 239 lb (108.4 kg)  11/29/21 239 lb (108.4 kg)    Physical Exam   General appearance - alert, well appearing, older African-American male and in no distress Mental status - normal mood, behavior, speech, dress, motor activity, and thought processes Eyes - pupils equal and reactive, extraocular eye movements intact Neck - supple, no significant adenopathy Chest - clear to auscultation, no wheezes, rales or rhonchi, symmetric air  entry Heart - normal rate, regular rhythm, normal S1, S2, no murmurs, rubs, clicks or gallops Extremities - peripheral pulses normal, no pedal edema, no clubbing or cyanosis     Latest Ref Rng & Units 10/19/2021   12:23 PM 09/22/2021    4:05 PM 05/07/2021    9:38 AM  CMP  Glucose 70 - 99 mg/dL 99  05/09/2021  952   BUN 6 - 24 mg/dL 7  16  22   Creatinine 0.76 - 1.27 mg/dL 4.16  6.06  3.01   Sodium 134 - 144 mmol/L 143  140  140   Potassium 3.5 - 5.2 mmol/L 4.3  3.9  4.1   Chloride 96 - 106 mmol/L 107  107  105   CO2 20 - 29 mmol/L 23  22  19    Calcium 8.7 - 10.2 mg/dL 9.4  9.8  9.5   Total Protein 6.0 - 8.5 g/dL   7.6   Total Bilirubin 0.0 - 1.2 mg/dL   0.8   Alkaline Phos 44 - 121 IU/L   125   AST 0 - 40 IU/L   28   ALT 0 - 44 IU/L   33    Lipid Panel     Component Value Date/Time   CHOL 120 11/27/2021 0813   TRIG 61 11/27/2021 0813   HDL 35 (L) 11/27/2021 0813   CHOLHDL 3.4 11/27/2021 0813   CHOLHDL 5.5 12/28/2018 2042   VLDL 15 12/28/2018 2042   LDLCALC 72 11/27/2021 0813    CBC    Component Value Date/Time   WBC 11.8 (H) 09/22/2021 1605   RBC 5.18 09/22/2021 1605   HGB 16.0 09/22/2021 1605   HGB 17.0 05/07/2021 0938   HCT 47.0 09/22/2021 1605   HCT 49.6 05/07/2021 0938   PLT 209 09/22/2021 1605   PLT 166 05/07/2021 0938   MCV 90.7 09/22/2021 1605   MCV 92 05/07/2021 0938   MCH 30.9 09/22/2021 1605   MCHC 34.0 09/22/2021 1605   RDW 12.8 09/22/2021 1605   RDW 12.7 05/07/2021 0938   LYMPHSABS 2.6 09/22/2021 1605   MONOABS 0.9 09/22/2021 1605   EOSABS 0.1 09/22/2021 1605   BASOSABS 0.1 09/22/2021 1605    ASSESSMENT AND PLAN: 1. Essential hypertension At goal.  Continue Norvasc and low-salt diet.  2. Hyperlipidemia, unspecified hyperlipidemia type LDL is good.  Continue atorvastatin 80 mg daily.  3. Thrombophilia (HCC) Continue Xarelto.  Continue to monitor for any excessive bruising.  He reports some rectal bleeding.  We will check CBC today.  He is due for  repeat colon cancer screening with colonoscopy.  Will refer to GI.  4. Hx of colonic polyp See #3 above - Ambulatory referral to Gastroenterology  5. Blood in stool See #3 above - Ambulatory referral to Gastroenterology - CBC  6. Muscle spasm of back Refill on Flexeril given.  7. Obesity (BMI 30.0-34.9) Commended him on weight loss.  Encouraged him to continue to try to eat healthy.  Further counseling given.  Encouraged him to move more.  8. Need for influenza vaccination Given today.  9. Need for shingles vaccine Second Shingrix vaccine given today.   Patient was given the opportunity to ask questions.  Patient verbalized understanding of the plan and was able to repeat key elements of the plan.   This documentation was completed using 10-26-1992.  Any transcriptional errors are unintentional.  No orders of the defined types were placed in this encounter.    Requested Prescriptions    No prescriptions requested or ordered in this encounter    No follow-ups on file.  Paediatric nurse, MD, FACP

## 2022-02-20 LAB — CBC
Hematocrit: 46 % (ref 37.5–51.0)
Hemoglobin: 15.5 g/dL (ref 13.0–17.7)
MCH: 30.6 pg (ref 26.6–33.0)
MCHC: 33.7 g/dL (ref 31.5–35.7)
MCV: 91 fL (ref 79–97)
Platelets: 154 10*3/uL (ref 150–450)
RBC: 5.06 x10E6/uL (ref 4.14–5.80)
RDW: 12.4 % (ref 11.6–15.4)
WBC: 7.8 10*3/uL (ref 3.4–10.8)

## 2022-03-07 ENCOUNTER — Encounter: Payer: Self-pay | Admitting: Specialist

## 2022-03-07 ENCOUNTER — Ambulatory Visit (INDEPENDENT_AMBULATORY_CARE_PROVIDER_SITE_OTHER): Payer: Medicaid Other | Admitting: Specialist

## 2022-03-07 VITALS — BP 119/75 | HR 71 | Ht 73.0 in | Wt 239.0 lb

## 2022-03-07 DIAGNOSIS — M5416 Radiculopathy, lumbar region: Secondary | ICD-10-CM | POA: Diagnosis not present

## 2022-03-07 DIAGNOSIS — M5441 Lumbago with sciatica, right side: Secondary | ICD-10-CM | POA: Diagnosis not present

## 2022-03-07 DIAGNOSIS — M545 Low back pain, unspecified: Secondary | ICD-10-CM

## 2022-03-07 DIAGNOSIS — M5136 Other intervertebral disc degeneration, lumbar region: Secondary | ICD-10-CM | POA: Diagnosis not present

## 2022-03-07 DIAGNOSIS — M25552 Pain in left hip: Secondary | ICD-10-CM

## 2022-03-07 DIAGNOSIS — R29898 Other symptoms and signs involving the musculoskeletal system: Secondary | ICD-10-CM | POA: Diagnosis not present

## 2022-03-07 DIAGNOSIS — R972 Elevated prostate specific antigen [PSA]: Secondary | ICD-10-CM

## 2022-03-07 DIAGNOSIS — M4726 Other spondylosis with radiculopathy, lumbar region: Secondary | ICD-10-CM

## 2022-03-07 DIAGNOSIS — M25551 Pain in right hip: Secondary | ICD-10-CM

## 2022-03-07 NOTE — Progress Notes (Signed)
Office Visit Note   Patient: Keith Potter           Date of Birth: 04/18/1966           MRN: [id number] Visit Date: 03/07/2022              Requested by: Keith Matar, MD [address] [place],  [place] [address] PCP: Keith Matar, MD   Assessment & Plan: Visit Diagnoses:  1. Degenerative disc disease, lumbar   2. Weakness of left leg   3. Radiculopathy, lumbar region   4. Low back pain with right-sided sciatica, unspecified back pain laterality, unspecified chronicity   5. Other spondylosis with radiculopathy, lumbar region   6. Pain in right hip   7. Bilateral hip pain   8. Low back pain, unspecified back pain laterality, unspecified chronicity, unspecified whether sciatica present   9. Elevated PSA, between 10 and less than 20 ng/ml     Plan: void bending, stooping and avoid lifting weights greater than 10 lbs. Avoid prolong standing and walking. Order for a new walker with wheels. Surgery scheduling secretary Keith Potter, will call you in the next week to schedule for surgery.  Surgery recommended is a two level lumbar fusion right L5-S1 and right L4-5 this would be done with rods, screws and cages with local bone graft and allograft (donor bone graft). Take hydrocodone for for pain. Risk of surgery includes risk of infection 1 in 200 patients, bleeding 1/2% chance you would need a transfusion.   Risk to the nerves is one in 10,000. You will need to use a brace for 3 months and wean from the brace on the 4th month. Expect improved walking and standing tolerance. Expect relief of leg pain but numbness may persist depending on the length and degree of pressure that has been present Pregablin and flexeril for pain I will fill disability forms as I believe Keith Potter is unable to perform work full time gainfully due to both discogenic and nerve compression within the lumbar spine. After 2 level fusion I believe he may be able to perform part time work  but I do not believe he  Will be able to retuy. rn to full time gainful employment and I encourage he to seek SSDI or long term disablilit Follow-Up Instructions: No follow-ups on file.   Orders:  No orders of the defined types were placed in this encounter.  No orders of the defined types were placed in this encounter.     Procedures: No procedures performed   Clinical Data: No additional findings.   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 We have been following for almost one year with ongoing pain in the back and some radiation into the right leg.Mostly pain in the back and right upper buttock medially. Underwent MRI with biforamenal stenosis L5 that could affect either nerve root. He is concerned that his paperwork for disability is not filled out and we will look into this. . Last seen and surgery in the form of a two level lumbar fusion was recommended. Pain persisting and it is about an 8-9 and pregablin and flexeril is helping some but at other times it is not as help. Pain is greater with standing and  walking and improves with sitting and stooping and bending. He can not sit too long. Sitting for 10-15 min and he has to move. Has difficult standing for more than 5-10 minutes  Review of Systems  Constitutional: Negative.   HENT: Negative.    Eyes: Negative.   Respiratory: Negative.    Cardiovascular: Negative.   Gastrointestinal: Negative.   Endocrine: Negative.   Genitourinary: Negative.   Musculoskeletal: Negative.   Skin: Negative.   Allergic/Immunologic: Negative.   Neurological: Negative.   Hematological: Negative.   Psychiatric/Behavioral: Negative.      Objective: Vital Signs: BP 119/75 (BP Location: Left Arm, Patient Position: Sitting)   Pulse 71   Ht 6\' 1"   (1.854 m)   Wt 239 lb (108.4 kg)   BMI 31.53 kg/m   Physical Exam Constitutional:      Appearance: He is well-developed.  HENT:     Head: Normocephalic and atraumatic.  Eyes:     Pupils: Pupils are equal, round, and reactive to light.  Pulmonary:     Effort: Pulmonary effort is normal.     Breath sounds: Normal breath sounds.  Abdominal:     General: Bowel sounds are normal.     Palpations: Abdomen is soft.  Musculoskeletal:     Cervical back: Normal range of motion and neck supple.     Lumbar back: Negative right straight leg raise test and negative left straight leg raise test.  Skin:    General: Skin is warm and dry.  Neurological:     Mental Status: He is alert and oriented to person, place, and time.  Psychiatric:        Behavior: Behavior normal.        Thought Content: Thought content normal.        Judgment: Judgment normal.   Back Exam   Tenderness  The patient is experiencing tenderness in the lumbar.  Range of Motion  Extension:  abnormal  Lateral bend right:  abnormal  Lateral bend left:  abnormal  Rotation right:  abnormal   Muscle Strength  Right Quadriceps:  5/5  Left Quadriceps:  5/5  Right Hamstrings:  5/5  Left Hamstrings:  5/5   Tests  Straight leg raise right: negative Straight leg raise left: negative  Other  Toe walk: abnormal Heel walk: abnormal Gait: abnormal   Comments:  Right great toe and 2nd toe amputation     Specialty Comments:  No specialty comments available.  Imaging: No results found.   PMFS History: Patient Active Problem List   Diagnosis Date Noted   Prediabetes 05/12/2021   Elevated PSA 05/12/2021   Former smoker 05/05/2021   Essential hypertension 05/05/2021   Tobacco dependence 04/23/2019   Marijuana user 04/23/2019   Rectal bleed 04/23/2019   History of pulmonary embolism 04/23/2019   DVT (deep venous thrombosis) (HCC) 03/31/2019   Pulmonary emboli (HCC) 03/27/2019   Hyperlipidemia 01/10/2019    Healthcare maintenance 01/10/2019   Pulmonary embolism (HCC) 12/28/2018   History of DVT (deep vein thrombosis) 12/28/2018   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   Chronic pain of toe, right 06/06/2016   Deformity of toe, right 06/06/2016   Increased frequency of urination 06/09/2013   Nocturia 06/09/2013   Past Medical History:  Diagnosis Date   DVT (deep venous thrombosis) (HCC)    Hypertension    Prediabetes    Pulmonary emboli (HCC)     Family History  Problem Relation Age of Onset   Diabetes Mother  Diabetes Father     Past Surgical History:  Procedure Laterality Date   AMPUTATION TOE     APPENDECTOMY     HERNIA REPAIR     Social History   Occupational History   Not on file  Tobacco Use   Smoking status: Former    Packs/day: 0.50    Types: Cigarettes    Quit date: 01/31/2021    Years since quitting: 1.0   Smokeless tobacco: Never  Vaping Use   Vaping Use: Never used  Substance and Sexual Activity   Alcohol use: Not Currently   Drug use: Yes    Types: Marijuana   Sexual activity: Yes    Partners: Female    Birth control/protection: Condom

## 2022-03-07 NOTE — Patient Instructions (Signed)
Plan: void bending, stooping and avoid lifting weights greater than 10 lbs. Avoid prolong standing and walking. Order for a new walker with wheels. Surgery scheduling secretary Tivis Ringer, will call you in the next week to schedule for surgery.  Surgery recommended is a two level lumbar fusion right L5-S1 and right L4-5 this would be done with rods, screws and cages with local bone graft and allograft (donor bone graft). Take hydrocodone for for pain. Risk of surgery includes risk of infection 1 in 200 patients, bleeding 1/2% chance you would need a transfusion.   Risk to the nerves is one in 10,000. You will need to use a brace for 3 months and wean from the brace on the 4th month. Expect improved walking and standing tolerance. Expect relief of leg pain but numbness may persist depending on the length and degree of pressure that has been present Pregablin and flexeril for pain I will fill disability forms as I believe Mr. Mellinger is unable to perform work full time gainfully due to both discogenic and nerve compression within the lumbar spine. After 2 level fusion I believe he may be able to perform part time work but I do not believe he  Will be able to retuy. rn to full time gainful employment and I encourage he to seek SSDI or long term disablilit

## 2022-03-14 ENCOUNTER — Telehealth: Payer: Self-pay | Admitting: Specialist

## 2022-03-14 NOTE — Telephone Encounter (Signed)
I called pt to let him know Dr.Nitka has now finished his paperwork and can come pick it up at any point.

## 2022-03-18 ENCOUNTER — Telehealth: Payer: Self-pay | Admitting: Specialist

## 2022-03-18 NOTE — Telephone Encounter (Signed)
Pt came into office on 9/25 to fax his disability forms that Dr.Nitka has given pt. I had faxed thr Authorization and paperwork to [phone].

## 2022-03-18 NOTE — Telephone Encounter (Signed)
Pt is wondering if he could get a cane?

## 2022-03-20 NOTE — Telephone Encounter (Signed)
I called and advised that he can pick one up at the pharmacy. I did advise that he needs to make sure it reached his hip level and to use it on the opposite side of his pain. He states that he understands this.

## 2022-03-21 NOTE — Progress Notes (Signed)
Office Visit Note   Patient: Keith Potter           Date of Birth: Feb 19, 1966           MRN: [id number] Visit Date: 02/14/2022              Requested by: Ladell Pier, MD [place],  [place] [address] PCP: Ladell Pier, MD   Assessment & Plan: Visit Diagnoses:  1. Degenerative disc disease, lumbar   2. Radiculopathy, lumbar region   3. Other spondylosis with radiculopathy, lumbar region   4. Pain in right hip   5. Weakness of left leg   6. Elevated PSA, between 10 and less than 20 ng/ml     Plan: Patient was not seen at this visit and he was rescheduled to return in the next one week.  Follow-Up Instructions: No follow-ups on file.   Orders:  No orders of the defined types were placed in this encounter.  No orders of the defined types were placed in this encounter.     Procedures: No procedures performed   Clinical Data: No additional findings.   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 We have been following for almost one year with ongoing pain in the back and some radiation into the right leg.Mostly pain in the back and right upper buttock medially. Underwent MRI with biforamenal stenosis L5 that could affect either nerve root. He is concerned that his paperwork for disability is not filled out and we will look into this. . Last seen and surgery in the form of a two level lumbar fusion was recommended. Pain persisting and it is about an 8-9 and pregablin and flexeril is helping some but at other times it is not as help. Pain is greater with standing and walking and improves with sitting and stooping and bending. He can not sit too long. Sitting for 10-15 min and he has to move. Has difficult standing for more than 5-10 minute. He  was not seen or evaluated on this day and a follow up visit was scheduled. He apparently left without being seen.      Review of Systems  Constitutional: Negative.   HENT: Negative.    Eyes: Negative.   Respiratory: Negative.    Cardiovascular: Negative.   Gastrointestinal: Negative.   Endocrine: Negative.   Genitourinary: Negative.   Musculoskeletal: Negative.   Skin: Negative.   Allergic/Immunologic: Negative.   Neurological: Negative.   Hematological: Negative.   Psychiatric/Behavioral: Negative.       Objective: Vital Signs: BP 124/82 (BP Location: Left Arm, Patient Position: Sitting, Cuff Size: Large)   Pulse 64   Ht 6\' 1"  (1.854 m)   Wt 239 lb (108.4 kg)   BMI 31.53 kg/m   Physical Exam Constitutional:      Appearance: He is well-developed.  HENT:     Head: Normocephalic and atraumatic.  Eyes:     Pupils: Pupils are equal, round, and reactive to light.  Pulmonary:     Effort: Pulmonary effort is normal.     Breath sounds: Normal breath sounds.  Abdominal:     General: Bowel sounds are normal.     Palpations: Abdomen is soft.  Musculoskeletal:  Cervical back: Normal range of motion and neck supple.  Skin:    General: Skin is warm and dry.  Neurological:     Mental Status: He is alert and oriented to person, place, and time.  Psychiatric:        Behavior: Behavior normal.        Thought Content: Thought content normal.        Judgment: Judgment normal.    Back Exam   Range of Motion  Flexion:  abnormal  Lateral bend right:  abnormal  Lateral bend left:  abnormal  Rotation right:  abnormal  Rotation left:  abnormal   Muscle Strength  Right Quadriceps:  4/5  Left Quadriceps:  4/5  Right Hamstrings:  4/5  Left Hamstrings:  4/5   Reflexes  Patellar:  0/4  Comments:  Has right foot amputation of toes due to on the job injury years ago      Specialty Comments:  No specialty comments available.  Imaging: No results found.   PMFS  History: Patient Active Problem List   Diagnosis Date Noted   Prediabetes 05/12/2021   Elevated PSA 05/12/2021   Former smoker 05/05/2021   Essential hypertension 05/05/2021   Tobacco dependence 04/23/2019   Marijuana user 04/23/2019   Rectal bleed 04/23/2019   History of pulmonary embolism 04/23/2019   DVT (deep venous thrombosis) ([place]) 03/31/2019   Pulmonary emboli ([place]) 03/27/2019   Hyperlipidemia 01/10/2019   Healthcare maintenance 01/10/2019   Pulmonary embolism ([place]) 12/28/2018   History of DVT (deep vein thrombosis) 12/28/2018   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   Chronic pain of toe, right 06/06/2016   Deformity of toe, right 06/06/2016   Increased frequency of urination 06/09/2013   Nocturia 06/09/2013   Past Medical History:  Diagnosis Date   DVT (deep venous thrombosis) (HCC)    Hypertension    Prediabetes    Pulmonary emboli ([place])     Family History  Problem Relation Age of Onset   Diabetes Mother    Diabetes Father     Past Surgical History:  Procedure Laterality Date   AMPUTATION TOE     APPENDECTOMY     HERNIA REPAIR     Social History   Occupational History   Not on file  Tobacco Use   Smoking status: Former    Packs/day: 0.50    Types: Cigarettes    Quit date: 01/31/2021    Years since quitting: 1.1   Smokeless tobacco: Never  Vaping Use   Vaping Use: Never used  Substance and Sexual Activity   Alcohol use: Not Currently   Drug use: Yes    Types: Marijuana   Sexual activity: Yes    Partners: Female    Birth control/protection: Condom

## 2022-03-25 ENCOUNTER — Ambulatory Visit (INDEPENDENT_AMBULATORY_CARE_PROVIDER_SITE_OTHER): Payer: Medicaid Other | Admitting: Orthopedic Surgery

## 2022-03-25 ENCOUNTER — Other Ambulatory Visit: Payer: Self-pay | Admitting: Internal Medicine

## 2022-03-25 ENCOUNTER — Ambulatory Visit (INDEPENDENT_AMBULATORY_CARE_PROVIDER_SITE_OTHER): Payer: Medicaid Other

## 2022-03-25 ENCOUNTER — Telehealth: Payer: Self-pay | Admitting: Orthopedic Surgery

## 2022-03-25 ENCOUNTER — Encounter: Payer: Self-pay | Admitting: Orthopedic Surgery

## 2022-03-25 ENCOUNTER — Other Ambulatory Visit: Payer: Self-pay

## 2022-03-25 VITALS — BP 135/80 | HR 64 | Ht 73.0 in | Wt 239.0 lb

## 2022-03-25 DIAGNOSIS — M5136 Other intervertebral disc degeneration, lumbar region: Secondary | ICD-10-CM

## 2022-03-25 DIAGNOSIS — I82409 Acute embolism and thrombosis of unspecified deep veins of unspecified lower extremity: Secondary | ICD-10-CM

## 2022-03-25 DIAGNOSIS — Z86711 Personal history of pulmonary embolism: Secondary | ICD-10-CM

## 2022-03-25 DIAGNOSIS — I10 Essential (primary) hypertension: Secondary | ICD-10-CM

## 2022-03-25 MED ORDER — RIVAROXABAN 20 MG PO TABS
ORAL_TABLET | ORAL | 2 refills | Status: DC
  Filled 2022-03-25: qty 30, 30d supply, fill #0
  Filled 2022-04-30: qty 30, 30d supply, fill #1
  Filled 2022-06-04: qty 30, 30d supply, fill #2

## 2022-03-25 MED ORDER — AMLODIPINE BESYLATE 5 MG PO TABS
5.0000 mg | ORAL_TABLET | Freq: Every day | ORAL | 2 refills | Status: DC
  Filled 2022-03-25: qty 30, 30d supply, fill #0
  Filled 2022-04-30: qty 30, 30d supply, fill #1
  Filled 2022-06-04: qty 30, 30d supply, fill #2

## 2022-03-25 MED ORDER — PREGABALIN 100 MG PO CAPS
100.0000 mg | ORAL_CAPSULE | Freq: Two times a day (BID) | ORAL | 1 refills | Status: DC
  Filled 2022-03-25: qty 60, 30d supply, fill #0
  Filled 2022-04-30: qty 60, 30d supply, fill #1

## 2022-03-25 NOTE — Progress Notes (Addendum)
Orthopedic Spine Surgery Office Note  Assessment: Patient is a 56 y.o. 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  It is worse at L5-S1.  He also has several physical exam findings consistent with SI joint pain.  Has not tried any injections yet.   Plan: -Explained that initially conservative treatment is tried as a significant number of patients may experience relief with these treatment modalities. Discussed that the conservative treatments include:  -activity modification  -physical therapy  -over the counter pain medications  -medrol dosepak  -lumbar steroid injections -Patient has tried ice/heat, ibuprofen, tylenol, activity modification  -Explained that there may be 2 problems going on.  Most of the exam seems to point towards the SI joint and that is where the majority of his pain is.  However, that would not explain the pain going down the back of his leg which may be due to the right-sided foraminal stenosis. -Recommended SI joint joint injection to the right side given his physical exam.  This will also be a diagnostic injection. -Has been previously prescribed lyrica by Dr. Louanne Skye. Refilled today but will have to have him transition to PCP or pain management for long-term management of this medication -Patient should return to office in 12 weeks, repeat x-rays of lumbar spine at next visit: scoli (check his LL, PI, and T1P angle)   Patient expressed understanding of the plan and all questions were answered to their satisfaction.   ___________________________________________________________________________   History:  Patient is a 56 y.o. male who presents today for lumbar spine.  Patient has had over 1 year of low back, right posterior hip, right posterior thigh pain.  There is no trauma or injury that brought on the pain.  He feels pain is worse with activity improved with rest.  He has paresthesias going in the same distribution as the pain.  His worst pain is in the  posterior hip.  No other paresthesias or numbness.   Weakness: Denies Symptoms of imbalance: Denies Paresthesias and numbness: Yes, as described above Bowel or bladder incontinence: Denies Saddle anesthesia: Denies Symptoms:  -leg cramping: Denies  -leg heaviness: Denies  -Symptoms improve with back flexion: Denies  Treatments tried: Ice/heat, ibuprofen, Tylenol, activity modification  Review of systems: Denies fevers and chills, night sweats, unexplained weight loss, history of cancer, pain that wakes them at night  Past medical history: DVT/PE HTN  Allergies: NKDA  Past surgical history:  Toe amputation Appendectomy Hernia repair  Social history: Denies use of nicotine product (smoking, vaping, patches, smokeless), but former smoker Alcohol use: denies Reports marijuana use, denies other recreational drug use    Physical Exam:  General: no acute distress, appears stated age Neurologic: alert, answering questions appropriately, following commands Respiratory: unlabored breathing on room air, symmetric chest rise Psychiatric: appropriate affect, normal cadence to speech   MSK (spine):  -Strength exam      Left  Right EHL    5/5  -/5 TA    5/5  5/5 GSC    5/5  5/5 Knee extension  5/5  5/5 Hip flexion   5/5  5/5  Toe amputation on right so could not test EHL. His hip abduction was 4/5 on the right and 5/5 on the left.   -Sensory exam    Sensation intact to light touch in L3-S1 nerve distributions of bilateral lower extremities  -Achilles DTR: 1/4 on the left, 1/4 on the right -Patellar tendon DTR: 1/4 on the left, 1/4 on the right  -Straight  leg raise: Positive -Contralateral straight leg raise: Negative -Femoral nerve stretch test: Negative  -Left hip exam: No pain through internal and external rotation, negative Stinchfield, negative FABER -Right hip exam: Positive Faber, tenderness palpation over the SI joint, pain with internal rotation at the  hip, no pain with external rotation at the hip, negative Stinchfield, pain reproduced with standing on single right leg, positive Gaenslen's, positive SI compression test  Imaging: XR of the lumbar spine from 03/25/2022 was independently reviewed and interpreted, showing disc height loss at L4-5 and L5-S1.  Facet arthropathy at the lower lumbar levels.  No evidence of instability on flexion/extension. PI of 71, LL of 58.   MRI of the lumbar spine from 11/28/2021 was independently reviewed and interpreted, showing DDD at L4/5 and L5/S1. 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 This stenosis is worse at L5/S1. At L4/5 it is worse on the right.    Patient name: Keith Potter Patient MRN: [id number] Date of visit: 03/25/22

## 2022-03-25 NOTE — Addendum Note (Signed)
Addended by: Ileene Rubens on: 03/25/2022 10:35 AM   Modules accepted: Orders

## 2022-03-25 NOTE — Telephone Encounter (Signed)
Pt seen Dr. Laurance Flatten this morning and usual get refill of pregabalin from Dr Louanne Skye. Please send refill from Dr Laurance Flatten to pharmacy on file [facility]. Pt phone number is [phone].

## 2022-03-28 ENCOUNTER — Other Ambulatory Visit: Payer: Self-pay

## 2022-03-28 NOTE — Telephone Encounter (Signed)
Pt called saying he has been out of this medication for over a week and wants to know if  Dr. Wynetta Emery is going to prescribe this for him again or refill.   [facility]

## 2022-03-29 ENCOUNTER — Other Ambulatory Visit: Payer: Self-pay

## 2022-04-30 ENCOUNTER — Telehealth: Payer: Self-pay | Admitting: Orthopedic Surgery

## 2022-04-30 ENCOUNTER — Other Ambulatory Visit: Payer: Self-pay

## 2022-04-30 NOTE — Telephone Encounter (Signed)
Patient would like a note stating that he can not work and how long he will be out of work would like the note mailed to him.

## 2022-05-01 NOTE — Telephone Encounter (Signed)
I called and spoke with patient, looks like JN filled out a FLMA form for pt in 02/2022.  I advised that this is a form that allows him to be out of work for 1-2 days at a time for back pain, he states that he thought it was for disability and to be out of work. I explained the purpose of the FLMA form to him and and he states that he understands now. He was scheduled for injection with Dr. Shon Baton on 05/03/22

## 2022-05-03 ENCOUNTER — Ambulatory Visit: Payer: Medicaid Other | Admitting: Sports Medicine

## 2022-05-06 ENCOUNTER — Encounter: Payer: Self-pay | Admitting: Sports Medicine

## 2022-05-06 ENCOUNTER — Ambulatory Visit: Payer: Medicaid Other | Admitting: Sports Medicine

## 2022-05-06 ENCOUNTER — Ambulatory Visit (INDEPENDENT_AMBULATORY_CARE_PROVIDER_SITE_OTHER): Payer: Medicaid Other | Admitting: Sports Medicine

## 2022-05-06 ENCOUNTER — Ambulatory Visit: Payer: Self-pay

## 2022-05-06 DIAGNOSIS — M533 Sacrococcygeal disorders, not elsewhere classified: Secondary | ICD-10-CM | POA: Diagnosis not present

## 2022-05-06 DIAGNOSIS — G8929 Other chronic pain: Secondary | ICD-10-CM | POA: Diagnosis not present

## 2022-05-06 NOTE — Telephone Encounter (Signed)
Pt checkout at visit... Pt requesting a work note to be out of work... Pt was a former [place] pt... Pt was seen by Dr. Shon Baton today... Pt requesting for note to be mailed out.Marland KitchenMarland Kitchen

## 2022-05-06 NOTE — Progress Notes (Signed)
   Procedure Note  Patient: Keith Potter             Date of Birth: 01/31/1966           MRN: [id number]             Visit Date: 05/06/2022  Procedures: Visit Diagnoses:  1. Chronic right SI joint pain    U/S-guided SI-joint injection, right   After discussion of risk/benefits/indications, informed verbal consent was obtained. A timeout was then performed. The patient was positioned in a prone position on exam room table with a pillow placed under the pelvis for mild hip flexion. The SI joint area was cleaned and prepped with betadine and alcohol swabs. Sterile ultrasound gel was applied and the ultrasound transducer was placed in an anatomic axial plane over the PSIS, then moved distally over the SI-joint. Using ultrasound guidance, a 22-gauge, 3.5" needle was inserted from a medial to lateral approach utilizing an in-plane approach and directed into the SI-joint. The SI-joint was then injected with a mixture of 4:2 lidocaine:depomedrol with visualization of the injectate flow into the SI-joint under ultrasound visualization. The patient tolerated the procedure well without immediate complications.  - I evaluated the patient about 10 minutes post-injection and he did have good improvement in pain and range of motion  - follow-up with Dr. Christell Constant as indicated; I am happy to see them as needed  Madelyn Brunner, DO Primary Care Sports Medicine Physician  [facility] - Orthopedics  This note was dictated using Dragon naturally speaking software and may contain errors in syntax, spelling, or content which have not been identified prior to signing this note.

## 2022-05-06 NOTE — Telephone Encounter (Addendum)
The office has spoken to the patient over the phone about his pain and desire to get a note to be off work. He is getting an injection today that will hopefully help with some his pain. He has had chronic pain with no acute changes or any recent surgery so I am not sure that a short-term note to let him off work is necessary. From what he told the office, he has been off work for many months now and is not any better. Accordingly, due to the duration of his symptoms and indefinite amount of time he would need to be off until he feels better, he probably should try to get disability if he is unable to work due to his chronic pain.

## 2022-05-22 ENCOUNTER — Ambulatory Visit (INDEPENDENT_AMBULATORY_CARE_PROVIDER_SITE_OTHER): Payer: Medicaid Other | Admitting: Podiatry

## 2022-05-22 ENCOUNTER — Telehealth: Payer: Self-pay | Admitting: Podiatry

## 2022-05-22 ENCOUNTER — Encounter: Payer: Self-pay | Admitting: Podiatry

## 2022-05-22 DIAGNOSIS — M2041 Other hammer toe(s) (acquired), right foot: Secondary | ICD-10-CM | POA: Diagnosis not present

## 2022-05-22 NOTE — Progress Notes (Signed)
Chief Complaint  Patient presents with   Toe Pain    Tip of 3rd toe pain    HPI: 56 y.o. male presenting today for follow-up evaluation of a symptomatic toe to the right third digit.  Patient has a history of amputation of the first and second digits about 20 years ago secondary to a work-related injury when his toes were crushed.  He states that his third toe was also crushed however it was salvaged.  He has had pain and tenderness for the last several years.  He has been unable to address the pain in his toe because he did not have insurance for a period of time.  Patient is on chronic anticoagulant secondary to history of DVT he presents for further treatment evaluation  Patient was last seen in the office 04/18/2021 and at that time partial toe amputation was recommended.  He was very nervous and was lost to follow-up.  Patient continues to have pain and tenderness however and he is ready to proceed with surgery.  Past Medical History:  Diagnosis Date   DVT (deep venous thrombosis) (HCC)    Hypertension    Prediabetes    Pulmonary emboli (HCC)    Past Surgical History:  Procedure Laterality Date   AMPUTATION TOE     APPENDECTOMY     HERNIA REPAIR     No Known Allergies  Physical Exam: General: The patient is alert and oriented x3 in no acute distress.  Dermatology: Skin is warm, dry and supple bilateral lower extremities. Negative for open lesions or macerations.  Vascular: Palpable pedal pulses bilaterally. No edema or erythema noted. Capillary refill within normal limits.  Neurological: Epicritic and protective threshold grossly intact bilaterally.   Musculoskeletal Exam: Absence of the first and second toe noted.  Severe hammertoe contracture deformity noted to the third digit of the right foot.  Associated chronic pain and severe pain with palpation of the toe.  Radiographic Exam RT foot 04/18/2021:  Absence of the first and second digits.  The bases of the proximal  phalanges are intact.  Severe hammertoe contracture noted especially to the third digit of the foot  Assessment: 1.  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  Chronic pain in toe right third digit  Plan of Care:  1. Patient evaluated. X-Rays reviewed.  2.  Again today we discussed the conservative versus surgical management of the presenting pathology. The patient opts for surgical management. All possible complications and details of the procedure were explained. All patient questions were answered. No guarantees were expressed or implied. 3. Authorization for surgery was reinitiated today. Surgery will consist of partial toe amputation right third digit.  I believe I can just disarticulate the DIPJ and remove the distal phalanx and toenail and this should help alleviate a lot of the patient's symptoms 4.  As per PCP recommendations stop anticoagulant medication 2 days prior to surgery then resume 24 hours after.   5.  Return to clinic 1 week postop       Felecia Shelling, DPM [facility]  Dr. Felecia Shelling, DPM    [address], [place] [address]  Office [phone]  Fax [phone]

## 2022-05-22 NOTE — Telephone Encounter (Signed)
DOS: 05/30/2022  [place] [employer]  Amputation Toe Interphalangeal 3rd Rt ([id number])  Clinicals were faxed to see if prior authorization is required.

## 2022-05-30 ENCOUNTER — Other Ambulatory Visit: Payer: Self-pay | Admitting: Podiatry

## 2022-05-30 ENCOUNTER — Other Ambulatory Visit (HOSPITAL_BASED_OUTPATIENT_CLINIC_OR_DEPARTMENT_OTHER): Payer: Self-pay

## 2022-05-30 ENCOUNTER — Other Ambulatory Visit: Payer: Self-pay

## 2022-05-30 DIAGNOSIS — M2041 Other hammer toe(s) (acquired), right foot: Secondary | ICD-10-CM

## 2022-05-30 MED ORDER — OXYCODONE-ACETAMINOPHEN 5-325 MG PO TABS
1.0000 | ORAL_TABLET | ORAL | 0 refills | Status: DC | PRN
  Filled 2022-05-30: qty 20, 4d supply, fill #0

## 2022-05-30 MED ORDER — IBUPROFEN 800 MG PO TABS
800.0000 mg | ORAL_TABLET | Freq: Three times a day (TID) | ORAL | 0 refills | Status: DC | PRN
  Filled 2022-05-30: qty 30, 10d supply, fill #0

## 2022-05-30 NOTE — Progress Notes (Signed)
PRN postop 

## 2022-06-03 ENCOUNTER — Ambulatory Visit: Payer: Medicaid Other | Attending: Internal Medicine | Admitting: Internal Medicine

## 2022-06-03 ENCOUNTER — Telehealth: Payer: Self-pay | Admitting: *Deleted

## 2022-06-03 ENCOUNTER — Encounter: Payer: Self-pay | Admitting: Internal Medicine

## 2022-06-03 VITALS — BP 122/84 | HR 51 | Ht 73.0 in | Wt 231.0 lb

## 2022-06-03 DIAGNOSIS — R42 Dizziness and giddiness: Secondary | ICD-10-CM | POA: Diagnosis not present

## 2022-06-03 NOTE — Telephone Encounter (Signed)
Patient is calling to ask when he should resume his medications stopped before surgery. Please advise.

## 2022-06-03 NOTE — Telephone Encounter (Signed)
Continue the Lyrica.  Only take oxycodone as needed for moderate to severe pain.  Okay to take both at the same time if needed.  Please notify patient.  Thanks, Dr. Logan Bores

## 2022-06-03 NOTE — Progress Notes (Signed)
Cardiology Office Note:    Date:  06/03/2022   ID:  Keith Potter, DOB 07-30-1965, MRN [id number]  PCP:  Marcine Matar, MD   [facility] Providers Cardiologist:  Maisie Fus, MD     Referring MD: Marcine Matar, MD   No chief complaint on file.  Originally Pre-Op Evaluation, follow up  History of Present Illness:    Keith Potter is a 56 y.o. 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 He said it was at night. He just finished eating. He had to stand up and stretch. It went away and then returned. He has a sharp pain/ and cramping. During the day, he doesn't do to much during the day. He walks to the store, he does not notice it. He gets some shortness of breath with walking. This has been 2-3 weeks. He lost his toes in a work accident.   He has not seen a cardiologist. He has no hx of stress test or LHC.   CVD Risk/Equivalent: HLD- yes  on atorvastatin HTN- yes, on norvasc 5 mg  PAD- no DMII -  pre diabetes A1c 5.7 Smoker- smoked cigarettes for 30 years, quit 3 months ago Premature Family History- No  Interim Hx 08/24/2021: Don't see documentation, per patient hammertoe surgery canceled 2/2 high LDL and blood pressure. Blood pressures typically well controlled. He had normal lexiscan stress ; EF <50 unclear if accurate. Echo showed low normal function.   Interim Hx 11/23/2021: Today Mr. Sebek presents for follow-up. He has not undergone surgery, he's concerned. His blood pressures are in good conrol today. They have been well controlled in April and May.   Interim Hx 06/03/2022 He had his hammertoe intervention. Bp is well controlled  Past Medical History:  Diagnosis Date   DVT (deep venous thrombosis) (HCC)    Hypertension    Prediabetes    Pulmonary emboli (HCC)     Past  Surgical History:  Procedure Laterality Date   AMPUTATION TOE     APPENDECTOMY     HERNIA REPAIR      Current Medications: Current Meds  Medication Sig   amLODipine (NORVASC) 5 MG tablet Take 1 tablet (5 mg total) by mouth daily.   Ascorbic Acid (VITAMIN C) 1000 MG tablet Take 1,000 mg by mouth daily.   atorvastatin (LIPITOR) 80 MG tablet Take 1 tablet (80 mg total) by mouth daily.   cyclobenzaprine (FLEXERIL) 10 MG tablet Take 1 tablet (10 mg total) by mouth 2 (two) times daily as needed for muscle spasms.   methylPREDNISolone (MEDROL) 4 MG tablet 6 day taper to be taken as directed.   Multiple Vitamin (MULTIVITAMIN WITH MINERALS) TABS tablet Take 1 tablet by mouth daily.   oxyCODONE-acetaminophen (PERCOCET) 5-325 MG tablet Take 1 tablet by mouth every 4 (four) hours as needed for severe pain.   pregabalin (LYRICA) 100 MG capsule Take 1 capsule (100 mg total) by mouth 2 (two) times daily.   rivaroxaban (XARELTO) 20 MG TABS tablet TAKE 1 TABLET BY MOUTH EVERY DAY WITH SUPPER   tamsulosin (FLOMAX) 0.4 MG CAPS capsule Take 2 capsules (0.8 mg total) by mouth daily.   tamsulosin (FLOMAX) 0.4 MG CAPS capsule take 2 caps by mouth daily     Allergies:   Patient  has no known allergies.   Social History   Socioeconomic History   Marital status: Single    Spouse name: Not on file   Number of children: Not on file   Years of education: Not on file   Highest education level: Not on file  Occupational History   Not on file  Tobacco Use   Smoking status: Former    Packs/day: 0.50    Types: Cigarettes    Quit date: 01/31/2021    Years since quitting: 1.3   Smokeless tobacco: Never  Vaping Use   Vaping Use: Never used  Substance and Sexual Activity   Alcohol use: Not Currently   Drug use: Yes    Types: Marijuana   Sexual activity: Yes    Partners: Female    Birth control/protection: Condom  Other Topics Concern   Not on file  Social History Narrative   Not on file   Social  Determinants of Health   Financial Resource Strain: Not on file  Food Insecurity: Not on file  Transportation Needs: Unmet Transportation Needs (08/24/2021)   PRAPARE - Administrator, Civil Service (Medical): Yes    Lack of Transportation (Non-Medical): Yes  Physical Activity: Not on file  Stress: Not on file  Social Connections: Not on file     Family History: The patient's family history includes Diabetes in his father and mother.  ROS:   Please see the history of present illness.     All other systems reviewed and are negative.  EKGs/Labs/Other Studies Reviewed:    The following studies were reviewed today:   EKG:  EKG is  ordered today.  The ekg ordered today demonstrates   NSR HR 71 bpm, Qtc 402 ms  11/23/2021- sinus bradycardia 59 bpm and 1st degree AV block  06/03/2022- sinus bradycardia with 1st degree AV block  Recent Labs: 09/22/2021: B Natriuretic Peptide 22.9 10/19/2021: BUN 7; Creatinine, Ser 0.88; Potassium 4.3; Sodium 143 02/19/2022: Hemoglobin 15.5; Platelets 154   Recent Lipid Panel    Component Value Date/Time   CHOL 120 11/27/2021 0813   TRIG 61 11/27/2021 0813   HDL 35 (L) 11/27/2021 0813   CHOLHDL 3.4 11/27/2021 0813   CHOLHDL 5.5 12/28/2018 2042   VLDL 15 12/28/2018 2042   LDLCALC 72 11/27/2021 0813     Risk Assessment/Calculations:     The ASCVD Risk score (Arnett DK, et al., 2019) failed to calculate for the following reasons:   The valid total cholesterol range is 130 to 320 mg/dL       Physical Exam:    VS:   Vitals:   06/03/22 0842  BP: 122/84  Pulse: (!) 51  SpO2: 95%     Wt Readings from Last 3 Encounters:  03/25/22 239 lb (108.4 kg)  03/07/22 239 lb (108.4 kg)  02/19/22 230 lb 3.2 oz (104.4 kg)     GEN:  Well nourished, well developed in no acute distress HEENT: moist mucous membranes LYMPHATICS: No lymphadenopathy CARDIAC: RRR, no murmurs, rubs, gallops RESPIRATORY:  Clear to auscultation without rales,  wheezing or rhonchi  ABDOMEN: Soft, non-tender, non-distended MUSCULOSKELETAL:  No edema; No deformity  SKIN: Warm and dry NEUROLOGIC:  Alert and oriented x 3 PSYCHIATRIC:  Normal affect   ASSESSMENT:    #Pre-op: Initially, he has not followed up for the procedure due to apprehension about surgery, if he changes his mind, he has acceptable cardiac risk for severe hammertoe contracture deformity surgery. He had a normal lexiscan  stress and echo. Blood pressure elevated sporadically in the past, now consistently controlled. Xarelto was recommended to be held for the procedure  Unprovoked DVT/PE: per PCP. On Xarelto  HTN-  He has normal blood pressures today doing well. He is managed with intermittent medrol which can contribute. Recommend continued ambulatory blood pressure monitoring. Continue norvasc 5 mg daily. BP goal < 130/80 mmHg. If consistently elevated in the future, recommend increasing his norvasc and adding chlorthalidone 25 mg daily.  HLD:  Patient has moderate ASCVD risk. LDL 237- Increased atorvastatin to 80 mg daily in March. LDL goal at least < 100 mg/dL. LDL 72 mg/dL. Will continue lipitor 80 .  PLAN:    In order of problems listed above:   As needed FU     Signed, Maisie Fus, MD  06/03/2022 8:46 AM    [facility]

## 2022-06-03 NOTE — Patient Instructions (Signed)

## 2022-06-03 NOTE — Telephone Encounter (Signed)
Pt has been taking started back taking his lipitor, norvasc,flomax and xarelto over the weekend. He is asking if he should take the Lyrica and the oxycodone together

## 2022-06-04 ENCOUNTER — Other Ambulatory Visit: Payer: Self-pay | Admitting: Orthopedic Surgery

## 2022-06-04 ENCOUNTER — Other Ambulatory Visit: Payer: Self-pay

## 2022-06-04 MED ORDER — PREGABALIN 100 MG PO CAPS
100.0000 mg | ORAL_CAPSULE | Freq: Two times a day (BID) | ORAL | 1 refills | Status: DC
  Filled 2022-06-04 – 2022-06-18 (×2): qty 60, 30d supply, fill #0
  Filled 2022-08-15 – 2022-11-14 (×2): qty 60, 30d supply, fill #1

## 2022-06-05 ENCOUNTER — Ambulatory Visit (INDEPENDENT_AMBULATORY_CARE_PROVIDER_SITE_OTHER): Payer: Medicaid Other | Admitting: Podiatry

## 2022-06-05 ENCOUNTER — Ambulatory Visit (INDEPENDENT_AMBULATORY_CARE_PROVIDER_SITE_OTHER): Payer: Medicaid Other

## 2022-06-05 DIAGNOSIS — Z9889 Other specified postprocedural states: Secondary | ICD-10-CM

## 2022-06-05 NOTE — Progress Notes (Signed)
   No chief complaint on file.   Subjective:  Patient presents today status post partial amputation to the third digit right foot.  DOS: 05/30/2022.  Patient doing well.  Dressings have been clean dry and intact.  He has been weightbearing as tolerated.   Past Medical History:  Diagnosis Date   DVT (deep venous thrombosis) (HCC)    Hypertension    Prediabetes    Pulmonary emboli (HCC)     Past Surgical History:  Procedure Laterality Date   AMPUTATION TOE     APPENDECTOMY     HERNIA REPAIR      No Known Allergies  Objective/Physical Exam Neurovascular status intact.  Incision well coapted with sutures intact. No sign of infectious process noted. No dehiscence. No active bleeding noted.  Negative for any significant edema noted to the surgical extremity.  Radiographic Exam RT foot 06/05/2022:  Amputations of the first second and third digits.  Base of the proximal phalanx remains to the first and second digit as well as the base of the middle phalanx third digit.  No appreciable soft tissue swelling or gas within the tissues  Assessment: 1. s/p partial third toe amputation right. DOS: 05/30/2022   Plan of Care:  1. Patient was evaluated. X-rays reviewed 2.  Dressings changed.  Clean dry and intact x 1 week 3.  Continue WBAT postsurgical shoe 4.  Return to clinic 1 week   Felecia Shelling, DPM [facility]  Dr. Felecia Shelling, DPM    [address] [place], [place] [address]                Office [phone]  Fax [phone]

## 2022-06-07 ENCOUNTER — Other Ambulatory Visit: Payer: Self-pay

## 2022-06-07 ENCOUNTER — Emergency Department (HOSPITAL_COMMUNITY)
Admission: EM | Admit: 2022-06-07 | Discharge: 2022-06-07 | Discharge status: Against Medical Advice | Payer: Medicaid Other | Attending: Emergency Medicine | Admitting: Emergency Medicine

## 2022-06-07 ENCOUNTER — Emergency Department (HOSPITAL_COMMUNITY): Payer: Medicaid Other

## 2022-06-07 ENCOUNTER — Encounter (HOSPITAL_COMMUNITY): Payer: Self-pay | Admitting: Emergency Medicine

## 2022-06-07 DIAGNOSIS — R0789 Other chest pain: Secondary | ICD-10-CM | POA: Diagnosis present

## 2022-06-07 DIAGNOSIS — Z5321 Procedure and treatment not carried out due to patient leaving prior to being seen by health care provider: Secondary | ICD-10-CM | POA: Diagnosis not present

## 2022-06-07 DIAGNOSIS — U071 COVID-19: Secondary | ICD-10-CM | POA: Insufficient documentation

## 2022-06-07 LAB — BASIC METABOLIC PANEL
Anion gap: 8 (ref 5–15)
BUN: 8 mg/dL (ref 6–20)
CO2: 24 mmol/L (ref 22–32)
Calcium: 9.3 mg/dL (ref 8.9–10.3)
Chloride: 110 mmol/L (ref 98–111)
Creatinine, Ser: 1.14 mg/dL (ref 0.61–1.24)
GFR, Estimated: >60 mL/min (ref >60)
Glucose, Bld: 122 mg/dL — ABNORMAL HIGH (ref 70–99)
Potassium: 4 mmol/L (ref 3.5–5.1)
Sodium: 142 mmol/L (ref 135–145)

## 2022-06-07 LAB — CBC
HCT: 45.4 % (ref 39.0–52.0)
Hemoglobin: 15.2 g/dL (ref 13.0–17.0)
MCH: 31.1 pg (ref 26.0–34.0)
MCHC: 33.5 g/dL (ref 30.0–36.0)
MCV: 92.8 fL (ref 80.0–100.0)
Platelets: 142 10*3/uL — ABNORMAL LOW (ref 150–400)
RBC: 4.89 MIL/uL (ref 4.22–5.81)
RDW: 12.9 % (ref 11.5–15.5)
WBC: 7.2 10*3/uL (ref 4.0–10.5)
nRBC: 0 % (ref 0.0–0.2)

## 2022-06-07 LAB — RESP PANEL BY RT-PCR (RSV, FLU A&B, COVID)  RVPGX2
Influenza A by PCR: NEGATIVE
Influenza B by PCR: NEGATIVE
Resp Syncytial Virus by PCR: NEGATIVE
SARS Coronavirus 2 by RT PCR: POSITIVE — AB

## 2022-06-07 LAB — TROPONIN I (HIGH SENSITIVITY)
Troponin I (High Sensitivity): 6 ng/L (ref <18)
Troponin I (High Sensitivity): 5 ng/L (ref <18)

## 2022-06-07 MED ORDER — ACETAMINOPHEN 325 MG PO TABS
650.0000 mg | ORAL_TABLET | Freq: Once | ORAL | Status: AC
  Administered 2022-06-07: 650 mg via ORAL
  Filled 2022-06-07: qty 2

## 2022-06-07 NOTE — ED Provider Triage Note (Signed)
Emergency Medicine Provider Triage Evaluation Note  Keith Potter , a 56 y.o. male  was evaluated in triage.  Pt complains of chest discomfort, back pain, nausea, shortness of breath for the last day.  Patient with history of blood clots in the lungs, but is compliant with his anticoagulant.  Patient denies arm discomfort, did feel some fever, chills, is worried about possible COVID, flu exposure.  Endorses up-to-date on COVID, flu vaccines.  Review of Systems  Positive: Chest pain, shortness of breath, nausea, chills Negative: Vomiting, abdominal pain  Physical Exam  BP 133/82 (BP Location: Right Arm)   Pulse 73   Temp 98.2 F (36.8 C)   Resp 18   SpO2 96%  Gen:   Awake, no distress   Resp:  Normal effort  MSK:   Moves extremities without difficulty  Other:    Medical Decision Making  Medically screening exam initiated at 3:22 PM.  Appropriate orders placed.  Keith Potter was informed that the remainder of the evaluation will be completed by another provider, this initial triage assessment does not replace that evaluation, and the importance of remaining in the ED until their evaluation is complete.  Workup initiated   Keith Potter, [place] 06/07/22 1523

## 2022-06-07 NOTE — ED Triage Notes (Addendum)
Pt reports chest discomfort and SOB this morning. Started to feel bad last night and thought may have flu. Took nyquil. Takes blood thinners. Endorses fever, chills, feeling flu like. Has been around people with flu. Compliant with home meds

## 2022-06-07 NOTE — ED Notes (Signed)
Pt called to be roomed, no response 

## 2022-06-11 ENCOUNTER — Other Ambulatory Visit: Payer: Self-pay

## 2022-06-11 ENCOUNTER — Other Ambulatory Visit: Payer: Medicaid Other

## 2022-06-12 ENCOUNTER — Encounter: Payer: Medicaid Other | Admitting: Podiatry

## 2022-06-18 ENCOUNTER — Other Ambulatory Visit: Payer: Self-pay | Admitting: *Deleted

## 2022-06-18 ENCOUNTER — Other Ambulatory Visit: Payer: Self-pay

## 2022-06-18 ENCOUNTER — Ambulatory Visit (INDEPENDENT_AMBULATORY_CARE_PROVIDER_SITE_OTHER): Payer: Medicaid Other | Admitting: *Deleted

## 2022-06-18 DIAGNOSIS — M2041 Other hammer toe(s) (acquired), right foot: Secondary | ICD-10-CM

## 2022-06-18 DIAGNOSIS — Z9889 Other specified postprocedural states: Secondary | ICD-10-CM

## 2022-06-18 NOTE — Progress Notes (Signed)
Patient presents today for post op visit # 2, patient of [facility].   POV #2 DOS 05/30/2022 PARTIAL RT 3RD TOE AMPUTATION   He presents in his surgical shoe. Denies any falls or injury to the foot. No signs of infection. No calf pain or shortness of breath. Bandages dry and intact. Incision is intact. Patient has completed antibiotics.   Sutures removed today. Incision stayed intact. No signs of infection.    Foot redressed today in a light DSD and placed him back in the shoe. Advised to leave bandages on until Friday and he could start showering regularly and he could try a regular sneaker. Monitor for signs of infection. Reviewed icing and elevation. She will follow up with Dr. Logan Bores in 2 weeks for POV# 3.   ~Patient was asking for refill of pain medication, Sent request to on call provider.

## 2022-06-19 ENCOUNTER — Ambulatory Visit (INDEPENDENT_AMBULATORY_CARE_PROVIDER_SITE_OTHER): Payer: Medicaid Other | Admitting: Orthopedic Surgery

## 2022-06-19 DIAGNOSIS — M533 Sacrococcygeal disorders, not elsewhere classified: Secondary | ICD-10-CM

## 2022-06-19 DIAGNOSIS — G8929 Other chronic pain: Secondary | ICD-10-CM

## 2022-06-19 NOTE — Progress Notes (Signed)
Orthopedic Spine Surgery Office Note  Assessment: Patient is a 56 y.o. male with low back pain that radiates down the posterior aspect of bilateral legs (R>L).  Also has right-sided low back pain with physical exam signs consistent with SI joint as etiology.  He did get some temporary relief with an injection of the SI joint.   Plan: -Explained that initially conservative treatment is tried as a significant number of patients may experience relief with these treatment modalities. Discussed that the conservative treatments include:  -activity modification  -physical therapy  -over the counter pain medications  -medrol dosepak  -lumbar steroid injections -Patient has tried Ice/heat, ibuprofen, Tylenol, activity modification, SI injection -He is recovering from his recent foot surgery and feels that some of his leg pain may be due to compensating as he is recovering from that.  I told him we should reevaluate after he has recovered from the foot surgery.  At that time we may do a L5/S1 injection for diagnostic and therapeutic purposes -Patient should return to office in 10 weeks, x-rays at next visit: none   Patient expressed understanding of the plan and all questions were answered to the patient's satisfaction.   ___________________________________________________________________________  History: Patient is a 56 y.o. male who has been previously seen in the office for symptoms consistent with lumbar radiculopathy. Since the last visit, symptom intensity has remained the same.  He is mostly having low back pain.  He feels it mostly on the right side.  It is in the area of the SI joint.  He does have some pain that radiates down the bilateral legs.  Feels it on the posterior aspect of the legs.  The right side is more symptomatic than the left.  He sometimes gets paresthesias down the back of the right leg.  He had a right-sided SI joint injection and feels like had decent relief for 2 weeks after  the injection.  Previous treatments: Ice/heat, ibuprofen, Tylenol, activity modification, SI injection   COPY OF FIRST NOTE Patient is a 56 y.o. male who presents today for lumbar spine.  Patient has had over 1 year of low back, right posterior hip, right posterior thigh pain.  There is no trauma or injury that brought on the pain.  He feels pain is worse with activity improved with rest.  He has paresthesias going in the same distribution as the pain.  His worst pain is in the posterior hip.  No other paresthesias or numbness.     Weakness: Denies Symptoms of imbalance: Denies Paresthesias and numbness: Yes, as described above Bowel or bladder incontinence: Denies Saddle anesthesia: Denies Symptoms:             -leg cramping: Denies             -leg heaviness: Denies             -Symptoms improve with back flexion: Denies END OF COPY  Physical Exam:  General: no acute distress, appears stated age Neurologic: alert, answering questions appropriately, following commands Respiratory: unlabored breathing on room air, symmetric chest rise Psychiatric: appropriate affect, normal cadence to speech   MSK (spine):  -Strength exam      Left  Right EHL    5/5  -/5 TA    5/5  3/5 GSC    5/5  3/5 Knee extension  5/5  5/5 Hip flexion   5/5  5/5  Did not test right ankle strength against resistance due to recent foot surgery. Had  amputation of the toe on the right so could not test EHL  -Sensory exam    Sensation intact to light touch in L3-S1 nerve distributions of bilateral lower extremities  -Achilles DTR: 1/4 on the left, 1/4 on the right -Patellar tendon DTR: 1/4 on the left, 1/4 on the right  -Straight leg raise: Negative  -Contralateral straight leg raise: Negative -Clonus: no beats bilaterally  -Left hip exam: No pain through internal and external rotation, negative Stinchfield, negative FABER -Right hip exam: Positive Faber, tenderness palpation over the SI joint, pain  with internal rotation at the hip, no pain with external rotation at the hip, negative Stinchfield, pain reproduced with standing on single right leg, positive Gaenslen's, positive SI compression test  Imaging: XR of the lumbar spine from 03/25/2022 was previously independently reviewed and interpreted, showing disc height loss at L4-5 and L5-S1.  Facet arthropathy at the lower lumbar levels.  No evidence of instability on flexion/extension. PI of 71, LL of 58.    MRI of the lumbar spine from 11/28/2021 was previously independently reviewed and interpreted, showing DDD at L4/5 and L5/S1. 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 This stenosis is worse at L5/S1. At L4/5 it is worse on the right.   Patient name: Keith Potter Patient MRN: [id number] Date of visit: 06/19/22

## 2022-07-01 ENCOUNTER — Ambulatory Visit (INDEPENDENT_AMBULATORY_CARE_PROVIDER_SITE_OTHER): Payer: Medicaid Other | Admitting: Podiatry

## 2022-07-01 VITALS — BP 133/76 | HR 59

## 2022-07-01 DIAGNOSIS — Z9889 Other specified postprocedural states: Secondary | ICD-10-CM

## 2022-07-01 NOTE — Progress Notes (Signed)
   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  DOS: 05/30/2022.  Patient continues to do well.  The pain has improved significantly.  Currently WB in the cam boot no new complaints at this time  Past Medical History:  Diagnosis Date   DVT (deep venous thrombosis) (HCC)    Hypertension    Prediabetes    Pulmonary emboli (HCC)     Past Surgical History:  Procedure Laterality Date   AMPUTATION TOE     APPENDECTOMY     HERNIA REPAIR      No Known Allergies  Objective/Physical Exam Neurovascular status intact.  Incision nicely healed.  Complete reepithelialization has occurred.  Radiographic Exam RT foot 06/05/2022:  Amputations of the first second and third digits.  Base of the proximal phalanx remains to the first and second digit as well as the base of the middle phalanx third digit.  No appreciable soft tissue swelling or gas within the tissues  Assessment: 1. s/p partial third toe amputation right. DOS: 05/30/2022 -Patient evaluated.  Light debridement of the flaking callus tissue was performed along the incision site.  Incision site is completely healed -Patient may discontinue the cam boot -Return to good supportive shoes and sneakers -Full activity no restrictions -Return to clinic as needed   Edrick Kins, DPM [facility]  Dr. Edrick Kins, DPM    [address], [place] [address]                Office [phone]  Fax [phone]

## 2022-07-10 ENCOUNTER — Other Ambulatory Visit: Payer: Self-pay

## 2022-07-10 ENCOUNTER — Other Ambulatory Visit: Payer: Self-pay | Admitting: Internal Medicine

## 2022-07-10 DIAGNOSIS — Z86711 Personal history of pulmonary embolism: Secondary | ICD-10-CM

## 2022-07-10 DIAGNOSIS — I10 Essential (primary) hypertension: Secondary | ICD-10-CM

## 2022-07-10 DIAGNOSIS — I82409 Acute embolism and thrombosis of unspecified deep veins of unspecified lower extremity: Secondary | ICD-10-CM

## 2022-07-10 MED ORDER — RIVAROXABAN 20 MG PO TABS
20.0000 mg | ORAL_TABLET | Freq: Every day | ORAL | 0 refills | Status: DC
  Filled 2022-07-10: qty 30, 30d supply, fill #0

## 2022-07-10 MED ORDER — AMLODIPINE BESYLATE 5 MG PO TABS
5.0000 mg | ORAL_TABLET | Freq: Every day | ORAL | 0 refills | Status: DC
  Filled 2022-07-10: qty 30, 30d supply, fill #0

## 2022-07-11 ENCOUNTER — Ambulatory Visit: Payer: Medicaid Other | Attending: Internal Medicine | Admitting: Internal Medicine

## 2022-07-11 ENCOUNTER — Encounter: Payer: Self-pay | Admitting: Internal Medicine

## 2022-07-11 VITALS — BP 109/71 | HR 82 | Temp 97.9°F | Ht 73.0 in | Wt 230.0 lb

## 2022-07-11 DIAGNOSIS — F320 Major depressive disorder, single episode, mild: Secondary | ICD-10-CM

## 2022-07-11 DIAGNOSIS — I10 Essential (primary) hypertension: Secondary | ICD-10-CM

## 2022-07-11 DIAGNOSIS — Z86711 Personal history of pulmonary embolism: Secondary | ICD-10-CM

## 2022-07-11 DIAGNOSIS — D6859 Other primary thrombophilia: Secondary | ICD-10-CM

## 2022-07-11 DIAGNOSIS — Z86010 Personal history of colonic polyps: Secondary | ICD-10-CM

## 2022-07-11 DIAGNOSIS — R8290 Unspecified abnormal findings in urine: Secondary | ICD-10-CM

## 2022-07-11 DIAGNOSIS — E785 Hyperlipidemia, unspecified: Secondary | ICD-10-CM | POA: Diagnosis not present

## 2022-07-11 DIAGNOSIS — M5416 Radiculopathy, lumbar region: Secondary | ICD-10-CM

## 2022-07-11 DIAGNOSIS — E669 Obesity, unspecified: Secondary | ICD-10-CM

## 2022-07-11 NOTE — Patient Instructions (Signed)

## 2022-07-11 NOTE — Progress Notes (Signed)
Patient ID: Keith Potter, male    DOB: January 27, 1966  MRN: [id number]  CC: Follow-up (Post-op f/u & HTN. Med refills - having financial difficulties to get meds.  Dallie Piles received flu vax this season.)   Subjective: Keith Potter is a 57 y.o. 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  On Norvasc 5 mg daily Med Adherence: [x]  Yes    []  No Medication side effects: []  Yes    [x]  No Adherence with salt restriction: [x]  Yes    []  No Home Monitoring?: []  Yes    [x]  No Monitoring Frequency:  Home BP results range:  SOB? [x]  Yes  when walking up hill Chest Pain?: []  Yes    [x]  No Leg swelling?: []  Yes    [x]  No  HL: reports tolerating and taking Lipitor 80 mg daily. Last LD 72   Hx DVT/PE on Xarelto: no bleeding on Xarelto  Obesity:  on last visit, he reported he had cut out fried foods, eating more veggies, more lean meats. Still doing well.  Drinks mainly water. Does exercises with rubber bands.  He walks a few times a week 1.5 blocks to and from a store close to his house.  Wgh still at 230 lbs same from last visit  +Dep/GAD7: He attributes the positive depression screen today to his social situation.  He has problems paying for his med with co-pay of $4; has had to receive on credit several times and has been told by pharmacy that they can no longer give to him on credit."I don't have a job or any money. " Applied for disability based on his chronic back issue.  Use to work housekeeping, maintenance and janitorial work.  Last worked 2020 or 2021.   Last MRI done 11/2021 showed disc degeneration at L5-S1, bulging disc, bilateral facet OA.  Additional finding  showed L4-L5 with minimal disc bulge no compressive stenosis. Has new ortho specialist Dr. since Dr. retired.  Given an injection to his back about 2 months ago.  He states that it lasted about 1 month.  Pain in the lower back is constant and radiates into the right leg.  It prevents him from doing just about everything including walking, standing and sitting.  Most comfortable when laying down.  Has a form in his car that he forgot to bring with him today that he would like for me to fill out regarding his disability. Reports his ortho specialist signed off on form to assist with him getting disability.   Denies any SI/HI  HM:  referred for c-scope on last visit. No appt/call.  Last c-scop was 12/2016 in [facility] Dr. .  Had several polyps removed.  Recommended repeat c-scope in 3-5 yrs.   Patient Active Problem List   Diagnosis Date Noted   Prediabetes 05/12/2021   Elevated PSA 05/12/2021   Former smoker 05/05/2021   Essential hypertension 05/05/2021   Tobacco dependence 04/23/2019   Marijuana user 04/23/2019   Rectal bleed 04/23/2019   History of pulmonary embolism 04/23/2019   DVT (deep venous thrombosis) (HCC) 03/31/2019   Pulmonary emboli (HCC) 03/27/2019   Hyperlipidemia 01/10/2019   Healthcare maintenance 01/10/2019  Pulmonary embolism ([place]) 12/28/2018   History of DVT (deep vein thrombosis) 12/28/2018   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   Chronic pain of toe, right 06/06/2016   Deformity of toe, right 06/06/2016   Increased frequency of urination 06/09/2013   Nocturia 06/09/2013     Current Outpatient Medications on File Prior to Visit  Medication Sig Dispense Refill   amLODipine (NORVASC) 5 MG tablet Take 1 tablet (5 mg total) by mouth daily. 30 tablet 0   Ascorbic Acid (VITAMIN C) 1000 MG tablet Take 1,000 mg by mouth daily.     atorvastatin (LIPITOR) 80 MG tablet Take 1 tablet (80 mg total) by mouth daily. 90 tablet 3   cyclobenzaprine  (FLEXERIL) 10 MG tablet Take 1 tablet (10 mg total) by mouth 2 (two) times daily as needed for muscle spasms. 30 tablet 1   methylPREDNISolone (MEDROL) 4 MG tablet 6 day taper to be taken as directed. 21 tablet 0   Multiple Vitamin (MULTIVITAMIN WITH MINERALS) TABS tablet Take 1 tablet by mouth daily.     oxyCODONE-acetaminophen (PERCOCET) 5-325 MG tablet Take 1 tablet by mouth every 4 (four) hours as needed for severe pain. 20 tablet 0   pregabalin (LYRICA) 100 MG capsule Take 1 capsule (100 mg total) by mouth 2 (two) times daily. 60 capsule 1   rivaroxaban (XARELTO) 20 MG TABS tablet Take 1 tablet (20 mg total) by mouth daily with supper. 30 tablet 0   tamsulosin (FLOMAX) 0.4 MG CAPS capsule Take 2 capsules (0.8 mg total) by mouth daily. 60 capsule 6   tamsulosin (FLOMAX) 0.4 MG CAPS capsule take 2 caps by mouth daily 60 capsule 6   No current facility-administered medications on file prior to visit.    No Known Allergies  Social History   Socioeconomic History   Marital status: Single    Spouse name: Not on file   Number of children: Not on file   Years of education: Not on file   Highest education level: Not on file  Occupational History   Not on file  Tobacco Use   Smoking status: Former    Packs/day: 0.50    Types: Cigarettes    Quit date: 01/31/2021    Years since quitting: 1.4   Smokeless tobacco: Never  Vaping Use   Vaping Use: Never used  Substance and Sexual Activity   Alcohol use: Not Currently   Drug use: Yes    Types: Marijuana   Sexual activity: Yes    Partners: Female    Birth control/protection: Condom  Other Topics Concern   Not on file  Social History Narrative   Not on file   Social Determinants of Health   Financial Resource Strain: Not on file  Food Insecurity: Not on file  Transportation Needs: Unmet Transportation Needs (08/24/2021)   PRAPARE - Hydrologist (Medical): Yes    Lack of Transportation (Non-Medical): Yes   Physical Activity: Not on file  Stress: Not on file  Social Connections: Not on file  Intimate Partner Violence: Not on file    Family History  Problem Relation Age of Onset   Diabetes Mother    Diabetes Father     Past Surgical History:  Procedure Laterality Date   AMPUTATION TOE     APPENDECTOMY     HERNIA REPAIR      ROS: Review of Systems Negative except as stated above  PHYSICAL EXAM: BP 109/71 (BP Location: Left Arm, Patient  Position: Sitting, Cuff Size: Normal)   Pulse 82   Temp 97.9 F (36.6 C) (Oral)   Ht 6\' 1"  (1.854 m)   Wt 230 lb (104.3 kg)   SpO2 96%   BMI 30.34 kg/m   Wt Readings from Last 3 Encounters:  07/11/22 230 lb (104.3 kg)  06/03/22 231 lb (104.8 kg)  03/25/22 239 lb (108.4 kg)    Physical Exam   General appearance - alert, well appearing, and in no distress Mental status - normal mood, behavior, speech, dress, motor activity, and thought processes Neck - supple, no significant adenopathy Chest - clear to auscultation, no wheezes, rales or rhonchi, symmetric air entry Heart - normal rate, regular rhythm, normal S1, S2, no murmurs, rubs, clicks or gallops Neurological -straight leg raise on the right side reproduces pain in the lower back.  Gait is stable.  Power in the lower extremities 5/5 on the left, 4/5 on the right proximally and distally. Extremities -no lower extremity edema.    07/11/2022    9:32 AM 02/19/2022    8:51 AM 10/19/2021   11:41 AM  Depression screen PHQ 2/9  Decreased Interest 2 2 2   Down, Depressed, Hopeless 2 2 0  PHQ - 2 Score 4 4 2   Altered sleeping 1 2 1   Tired, decreased energy 2 2 1   Change in appetite 2 0 2  Feeling bad or failure about yourself  3 1 0  Trouble concentrating 2 2 0  Moving slowly or fidgety/restless 0 0 0  Suicidal thoughts 0 0 0  PHQ-9 Score 14 11 6       07/11/2022    9:32 AM 02/19/2022    8:51 AM 10/19/2021   11:41 AM 06/12/2021    3:58 PM  GAD 7 : Generalized Anxiety Score   Nervous, Anxious, on Edge 0 0 0 1  Control/stop worrying 1 1 1 1   Worry too much - different things 3 2 3 1   Trouble relaxing 0 3 2 1   Restless 1 3 3 1   Easily annoyed or irritable 1 1 1 1   Afraid - awful might happen 0 0 0 2  Total GAD 7 Score 6 10 10 8         Latest Ref Rng & Units 06/07/2022    3:40 PM 10/19/2021   12:23 PM 09/22/2021    4:05 PM  CMP  Glucose 70 - 99 mg/dL 02/21/2022  99  10/21/2021   BUN 6 - 20 mg/dL 8  7  16    Creatinine 0.61 - 1.24 mg/dL 06/14/2021     Sodium 135 - 145 mmol/L 142  143  140   Potassium 3.5 - 5.1 mmol/L 4.0  4.3  3.9   Chloride 98 - 111 mmol/L 110  107  107   CO2 22 - 32 mmol/L 24  23  22    Calcium 8.9 - 10.3 mg/dL 9.3  9.4  9.8    Lipid Panel     Component Value Date/Time   CHOL 120 11/27/2021 0813   TRIG 61 11/27/2021 0813   HDL 35 (L) 11/27/2021 0813   CHOLHDL 3.4 11/27/2021 0813   CHOLHDL 5.5 12/28/2018 2042   VLDL 15 12/28/2018 2042   LDLCALC 72 11/27/2021 0813    CBC    Component Value Date/Time   WBC 7.2 06/07/2022 1540   RBC 4.89 06/07/2022 1540   HGB 15.2 06/07/2022 1540   HGB 15.5 02/19/2022 0941   HCT 45.4 06/07/2022 1540  HCT 46.0 02/19/2022 0941   PLT 142 (L) 06/07/2022 1540   PLT 154 02/19/2022 0941   MCV 92.8 06/07/2022 1540   MCV 91 02/19/2022 0941   MCH 31.1 06/07/2022 1540   MCHC 33.5 06/07/2022 1540   RDW 12.9 06/07/2022 1540   RDW 12.4 02/19/2022 0941   LYMPHSABS 2.6 09/22/2021 1605   MONOABS 0.9 09/22/2021 1605   EOSABS 0.1 09/22/2021 1605   BASOSABS 0.1 09/22/2021 1605    ASSESSMENT AND PLAN: 1. Essential hypertension At goal.  Continue Norvasc 5 mg daily.  2. Hyperlipidemia, unspecified hyperlipidemia type Continue atorvastatin.  He is due for liver function test. - Hepatic Function Panel  3. Thrombophilia ([place]) 4. History of pulmonary embolism Continue Xarelto.  5. Hx of colonic polyp I will resubmit referral for him to get his colonoscopy done. - Ambulatory referral to  Gastroenterology  6. Major depressive disorder, single episode, mild ([place]) Patient does not feel that he needs medication or counseling at this time.  He feels his mood will improve once he is approved for disability or his financial situation improves. Advised patient to speak with DSS to see if they have any programs to assist with him purchasing his medicines.  7. Lumbar radiculopathy, right Advised patient that he can leave the form at the front desk and I will take a look at it.  If it is 1 that asked about functional capacity testing, I told him that I do not complete those forms.  In regards to disability I usually have patient request his records to put with his disability application.  It seems that his orthopedics would more likely be the one in determining whether his back issue warrants long-term disability.  8. Obesity (BMI 30.0-34.9) Commended him on dietary changes.  Further dietary counseling given.  Advised to change from white bread to wheat bread.  Try to move is much as his back would allow.  9. Bad odor of urine After I had left the room and my CMA went to discharge patient, he told her that his urine has had a bad odor.  I told her to let them know we can send off for urine to check for UTI.     Patient was given the opportunity to ask questions.  Patient verbalized understanding of the plan and was able to repeat key elements of the plan.   This documentation was completed using Radio producer.  Any transcriptional errors are unintentional.  No orders of the defined types were placed in this encounter.    Requested Prescriptions    No prescriptions requested or ordered in this encounter    No follow-ups on file.  Karle Plumber, MD, FACP

## 2022-07-12 LAB — URINALYSIS, ROUTINE W REFLEX MICROSCOPIC
Bilirubin, UA: NEGATIVE
Glucose, UA: NEGATIVE
Ketones, UA: NEGATIVE
Leukocytes,UA: NEGATIVE
Nitrite, UA: NEGATIVE
Protein,UA: NEGATIVE
RBC, UA: NEGATIVE
Specific Gravity, UA: 1.024 (ref 1.005–1.030)
Urobilinogen, Ur: 0.2 mg/dL (ref 0.2–1.0)
pH, UA: 5.5 (ref 5.0–7.5)

## 2022-07-12 LAB — HEPATIC FUNCTION PANEL
ALT: 50 IU/L — ABNORMAL HIGH (ref 0–44)
AST: 34 IU/L (ref 0–40)
Albumin: 4.3 g/dL (ref 3.8–4.9)
Alkaline Phosphatase: 110 IU/L (ref 44–121)
Bilirubin Total: 0.3 mg/dL (ref 0.0–1.2)
Bilirubin, Direct: 0.11 mg/dL (ref 0.00–0.40)
Total Protein: 7.3 g/dL (ref 6.0–8.5)

## 2022-08-15 ENCOUNTER — Other Ambulatory Visit: Payer: Self-pay

## 2022-08-15 ENCOUNTER — Other Ambulatory Visit: Payer: Self-pay | Admitting: Internal Medicine

## 2022-08-15 DIAGNOSIS — Z86711 Personal history of pulmonary embolism: Secondary | ICD-10-CM

## 2022-08-15 DIAGNOSIS — I82409 Acute embolism and thrombosis of unspecified deep veins of unspecified lower extremity: Secondary | ICD-10-CM

## 2022-08-15 DIAGNOSIS — I10 Essential (primary) hypertension: Secondary | ICD-10-CM

## 2022-08-15 MED ORDER — RIVAROXABAN 20 MG PO TABS
20.0000 mg | ORAL_TABLET | Freq: Every day | ORAL | 1 refills | Status: DC
  Filled 2022-08-15: qty 90, 90d supply, fill #0
  Filled 2022-11-14: qty 90, 90d supply, fill #1

## 2022-08-15 MED ORDER — AMLODIPINE BESYLATE 5 MG PO TABS
5.0000 mg | ORAL_TABLET | Freq: Every day | ORAL | 1 refills | Status: DC
  Filled 2022-08-15: qty 90, 90d supply, fill #0
  Filled 2022-11-14: qty 90, 90d supply, fill #1

## 2022-08-15 NOTE — Telephone Encounter (Signed)
Requested Prescriptions  Pending Prescriptions Disp Refills   amLODipine (NORVASC) 5 MG tablet 90 tablet 1    Sig: Take 1 tablet (5 mg total) by mouth daily.     Cardiovascular: Calcium Channel Blockers 2 Passed - 08/15/2022  9:51 AM      Passed - Last BP in normal range    BP Readings from Last 1 Encounters:  07/11/22 109/71         Passed - Last Heart Rate in normal range    Pulse Readings from Last 1 Encounters:  07/11/22 82         Passed - Valid encounter within last 6 months    Recent Outpatient Visits           1 month ago Essential hypertension   [place], MD   5 months ago Essential hypertension   [place], MD   8 months ago Need for zoster vaccination   [place], Jarome Matin, RPH-CPP   10 months ago Essential hypertension   [place], MD   1 year ago Essential hypertension   [place], MD       Future Appointments             In 6 days Callie Fielding, MD [facility]   In 3 months Ladell Pier, MD [place]             rivaroxaban (XARELTO) 20 MG TABS tablet 30 tablet 0    Sig: Take 1 tablet (20 mg total) by mouth daily with supper.     Hematology: Anticoagulants - rivaroxaban Failed - 08/15/2022  9:51 AM      Failed - ALT in normal range and within 360 days    ALT  Date Value Ref Range Status  07/11/2022 50 (H) 0 - 44 IU/L Final         Failed - PLT in normal range and within 360 days    Platelets  Date Value Ref Range Status  06/07/2022 142 (L) 150 - 400 K/uL Final  02/19/2022 154 150 - 450 x10E3/uL Final         Passed - AST in normal range and within 360 days    AST  Date Value Ref Range Status  07/11/2022  34 0 - 40 IU/L Final         Passed - Cr in normal range and within 360 days    Creatinine, Ser  Date Value Ref Range Status  06/07/2022 1.14 0.61 - 1.24 mg/dL Final         Passed - HCT in normal range and within 360 days    HCT  Date Value Ref Range Status  06/07/2022 45.4 39.0 - 52.0 % Final   Hematocrit  Date Value Ref Range Status  02/19/2022 46.0 37.5 - 51.0 % Final         Passed - HGB in normal range and within 360 days    Hemoglobin  Date Value Ref Range Status  06/07/2022 15.2 13.0 - 17.0 g/dL Final  02/19/2022 15.5 13.0 - 17.7 g/dL Final         Passed - eGFR is 15 or above and within 360 days    GFR  calc Af Amer  Date Value Ref Range Status  04/01/2019 >60 >60 mL/min Final   GFR, Estimated  Date Value Ref Range Status  06/07/2022 >60 >60 mL/min Final    Comment:    (NOTE) Calculated using the CKD-EPI Creatinine Equation (2021)    eGFR  Date Value Ref Range Status  10/19/2021 100 >59 mL/min/1.73 Final         Passed - Patient is not pregnant      Passed - Valid encounter within last 12 months    Recent Outpatient Visits           1 month ago Essential hypertension   [place], MD   5 months ago Essential hypertension   [place], MD   8 months ago Need for zoster vaccination   [place], Jarome Matin, RPH-CPP   10 months ago Essential hypertension   [place] Ladell Pier, MD   1 year ago Essential hypertension   [place], MD       Future Appointments             In 6 days Callie Fielding, MD [place]   In 3 months Ladell Pier, MD [place]

## 2022-08-21 ENCOUNTER — Other Ambulatory Visit: Payer: Self-pay

## 2022-08-21 ENCOUNTER — Ambulatory Visit (INDEPENDENT_AMBULATORY_CARE_PROVIDER_SITE_OTHER): Payer: Medicaid Other | Admitting: Orthopedic Surgery

## 2022-08-21 ENCOUNTER — Telehealth: Payer: Self-pay | Admitting: Internal Medicine

## 2022-08-21 DIAGNOSIS — M5441 Lumbago with sciatica, right side: Secondary | ICD-10-CM | POA: Diagnosis not present

## 2022-08-21 DIAGNOSIS — M5442 Lumbago with sciatica, left side: Secondary | ICD-10-CM

## 2022-08-21 DIAGNOSIS — G8929 Other chronic pain: Secondary | ICD-10-CM

## 2022-08-21 NOTE — Telephone Encounter (Signed)
Pt states he had a referral he is just wanting to know the status of it.

## 2022-08-21 NOTE — Progress Notes (Signed)
Patient seen by Angus Seller, PharmD Candidate on 08/21/22 while they were picking up prescriptions at [place] at [facility].   Blood pressure today was: 125/84, HR 71   Patient does not have an automated home blood pressure machine. He does not know what his blood pressure usually is.   Medication review was performed. They are not taking medications as prescribed. Differences from their prescribed list include: Tamsulosin - taking BID instead of 2 capsules once daily. He's fine with taking it in the evening. Has not been taking medications (Xarelto, amlodipine, atorvastatin) for the past 3 days because he was out and has trouble getting to the pharmacy.   The following barriers to adherence were noted:  - They do have cost concerns. He is on Medicaid.  - They do have transportation concerns. He prefers this pharmacy, but has trouble getting to the pharmacy.  - They do not need assistance obtaining refills.  - They do occasionally forget to take some of their prescribed medications. Has missed past 3 days due to socioeconomic barriers.  - They do not feel like one/some of their medications make them feel poorly.  - They do have questions or concerns about their medications. States that his pregabalin isn't working for his back pain. Doesn't like Tylenol and knows he shouldn't be on NSAIDs, would like to check up on this with his next PCP visit.  - They do have follow up scheduled with their primary care provider/cardiologist.   The following interventions were completed:  - Medications were reviewed  - Patient was educated on goal blood pressures and long term health implications of elevated blood pressure  - Patient was educated on medications, including indication and administration  - Patient was educated on use of adherence strategies, like a pill box or alarms   The patient has follow up scheduled: 11/14/2022  PCP: Dr. Sunny Schlein,  PharmD Candidate  [employer]   Joseph Art, [place].D. PGY-2 Ambulatory Care Pharmacy Resident

## 2022-08-21 NOTE — Progress Notes (Signed)
Orthopedic Spine Surgery Office Note  Assessment: Patient is a 57 y.o. male with low back pain that radiates down the posterior aspect of bilateral legs (R>L).  Also has right-sided low back pain with physical exam signs consistent with SI joint as etiology.  He did get some temporary relief with an injection of the SI joint.    Plan: -Explained that initially conservative treatment is tried as a significant number of patients may experience relief with these treatment modalities. Discussed that the conservative treatments include:  -activity modification  -physical therapy  -over the counter pain medications  -medrol dosepak  -lumbar steroid injections -Patient has tried Ice/heat, ibuprofen, Tylenol, activity modification, SI joint injection, Lyrica -Discussed diagnostic and therapeutic injection to L5-S1, but patient not interested at this time.  Ask for something stronger in terms of medication help with this pain.  I then discussed pain management as an option for him if that is the treatment he is seeking.  He wanted to go that route, so pain management referral provided to him today -I told him that if he ever wants to discuss diagnostic and therapeutic injections to evaluate his low back pain radiating to his bilateral lower extremities, he should call the office and I will see him again   Patient expressed understanding of the plan and all questions were answered to the patient's satisfaction.   ___________________________________________________________________________  History: Patient is a 57 y.o. male who has been previously seen in the office for symptoms of low back pain that radiates into the bilateral lower extremities.  He has a history of chronic low back pain on the right side.  He has had an injection of the SI joint that did give him some temporary relief.  He states that his back pain is the majority of his pain.  His leg pain is tolerable.  He feels the leg pain along the  posterior aspect of the bilateral thighs and legs.  He notices this on a constant basis.  His most significant pain though is his low back and he is interested in medical management of that.  Denies paresthesias and numbness.  No bowel/bladder incontinence.  No saddle anesthesia.  Previous treatments: Ice/heat, ibuprofen, Tylenol, activity modification, SI joint injection, Lyrica   Physical Exam:  General: no acute distress, appears stated age Neurologic: alert, answering questions appropriately, following commands Respiratory: unlabored breathing on room air, symmetric chest rise Psychiatric: appropriate affect, normal cadence to speech   MSK (spine):   -Strength exam                                                   Left                  Right EHL                              5/5                  -/5 TA                                 5/5                  4/5 GSC  5/5                  4/5 Knee extension            5/5                  5/5 Hip flexion                    5/5                  5/5   Right ankle strength against resistance limited due to recent foot surgery. Had amputation of the toe on the right so could not test EHL   -Sensory exam                           Sensation intact to light touch in L3-S1 nerve distributions of bilateral lower extremities   -Achilles DTR: 1/4 on the left, 1/4 on the right -Patellar tendon DTR: 1/4 on the left, 1/4 on the right   -Straight leg raise: Negative  -Contralateral straight leg raise: Negative -Clonus: no beats bilaterally   -Left hip exam: No pain through internal and external rotation, negative Stinchfield, negative FABER -Right hip exam: Positive Faber, tenderness palpation over the SI joint, positive FADIR, no pain with external rotation at the hip, negative Stinchfield, positive Gaenslen's  Imaging: XR of the lumbar spine from 03/25/2022 was previously independently reviewed and interpreted,  showing disc height loss at L4-5 and L5-S1.  Facet arthropathy at the lower lumbar levels.  No evidence of instability on flexion/extension. PI of 71, LL of 58.    MRI of the lumbar spine from 11/28/2021 was previously independently reviewed and interpreted, showing DDD at L4/5 and L5/S1. 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 This stenosis is worse at L5/S1. At L4/5 it is worse on the right.   Patient name: Keith Potter Patient MRN: [id number] Date of visit: 08/21/22

## 2022-08-29 ENCOUNTER — Other Ambulatory Visit: Payer: Self-pay

## 2022-08-29 MED ORDER — OXYCODONE-ACETAMINOPHEN 5-325 MG PO TABS
ORAL_TABLET | ORAL | 0 refills | Status: DC
  Filled 2022-08-29: qty 20, 5d supply, fill #0

## 2022-09-04 ENCOUNTER — Other Ambulatory Visit: Payer: Self-pay

## 2022-09-04 MED ORDER — OXYCODONE-ACETAMINOPHEN 5-325 MG PO TABS
1.0000 | ORAL_TABLET | Freq: Four times a day (QID) | ORAL | 0 refills | Status: DC | PRN
  Filled 2022-09-04: qty 20, 5d supply, fill #0

## 2022-09-10 ENCOUNTER — Other Ambulatory Visit: Payer: Self-pay

## 2022-09-17 ENCOUNTER — Other Ambulatory Visit: Payer: Self-pay

## 2022-09-17 ENCOUNTER — Ambulatory Visit: Payer: Medicaid Other | Attending: Physician Assistant

## 2022-09-17 DIAGNOSIS — M542 Cervicalgia: Secondary | ICD-10-CM | POA: Diagnosis present

## 2022-09-17 DIAGNOSIS — M6281 Muscle weakness (generalized): Secondary | ICD-10-CM | POA: Diagnosis present

## 2022-09-17 NOTE — Therapy (Addendum)
OUTPATIENT PHYSICAL THERAPY CERVICAL EVALUATION   Patient Name: Keith Potter MRN: [id number] DOB:Aug 11, 1965, 57 y.o., male Today's Date: 09/18/2022  END OF SESSION:  PT End of Session - 09/17/22 0929     Visit Number 1    Number of Visits 12    Date for PT Re-Evaluation 10/29/22    Authorization Type [place] [employer]    PT Start Time 0845    PT Stop Time 0915    PT Time Calculation (min) 30 min    Activity Tolerance Patient limited by pain    Behavior During Therapy [facility] for tasks assessed/performed             Past Medical History:  Diagnosis Date   DVT (deep venous thrombosis) ([place])    Hypertension    Prediabetes    Pulmonary emboli ([place])    Past Surgical History:  Procedure Laterality Date   AMPUTATION TOE     APPENDECTOMY     HERNIA REPAIR     Patient Active Problem List   Diagnosis Date Noted   Thrombophilia ([place]) 07/11/2022   Prediabetes 05/12/2021   Elevated PSA 05/12/2021   Former smoker 05/05/2021   Essential hypertension 05/05/2021   Tobacco dependence 04/23/2019   Marijuana user 04/23/2019   Rectal bleed 04/23/2019   History of pulmonary embolism 04/23/2019   DVT (deep venous thrombosis) ([place]) 03/31/2019   Hyperlipidemia 01/10/2019   Healthcare maintenance 01/10/2019   History of DVT (deep vein thrombosis) 12/28/2018   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   Chronic pain of toe, right 06/06/2016   Deformity of toe, right 06/06/2016   Increased frequency of urination 06/09/2013   Nocturia 06/09/2013    PCP: Ladell Pier, MD  REFERRING PROVIDER: Starr Lake, PA-C  REFERRING DIAG: M54.2 (ICD-10-CM) - Cervicalgia   THERAPY DIAG:  Cervicalgia - Plan: PT plan of care cert/re-cert  Muscle weakness (generalized) - Plan: PT plan of care cert/re-cert  Rationale for Evaluation and Treatment: Rehabilitation  ONSET DATE: Chronic  SUBJECTIVE:                                                                                                                                                                                                          SUBJECTIVE STATEMENT: Pt presents to PT with reports of chronic neck pain and discomfort. Notes midline neck and upper trap pain, no referral past AC joint or N/T. Denies bowel/bladder changes or saddle anesthesia. Has difficulty with home ADLs and other daily activities secondary to pain.  PERTINENT HISTORY:  HTN, PE  PAIN:  Are you having pain?  Yes: NPRS scale: 7/10 Worst: 10/10 Pain location: cervical spine Pain description: sharp, stiff Aggravating factors: sleeping in certain positions,  Relieving factors: rest, medication  PRECAUTIONS: None  WEIGHT BEARING RESTRICTIONS: No  FALLS:  Has patient fallen in last 6 months? No  LIVING ENVIRONMENT: Lives with: lives alone Lives in: House/apartment Stairs: No Has following equipment at home: None  OCCUPATION: On disability  PLOF: Independent  PATIENT GOALS: decrease neck pain, improve comfort with daily activity  NEXT MD VISIT: 09/18/2022  OBJECTIVE:   DIAGNOSTIC FINDINGS:  N/A  PATIENT SURVEYS:  NDI: 36/50 - 72% disability  COGNITION: Overall cognitive status: Within functional limits for tasks assessed  SENSATION: WFL  POSTURE: rounded shoulders and forward head  PALPATION: TTP to mid cervical spine, R upper trap   CERVICAL ROM:   Active ROM A/PROM (deg) eval  Flexion   Extension   Right lateral flexion   Left lateral flexion   Right rotation 34  Left rotation 42   (Blank rows = not tested)  UPPER EXTREMITY ROM:  Active ROM Right eval Left eval  Shoulder flexion 115 115  Shoulder extension    Shoulder abduction    Shoulder adduction    Shoulder extension    Shoulder internal rotation    Shoulder external rotation    Elbow flexion    Elbow extension    Wrist flexion    Wrist extension    Wrist ulnar deviation    Wrist radial deviation    Wrist pronation    Wrist  supination     (Blank rows = not tested)  UPPER EXTREMITY MMT:  MMT Right eval Left eval  Shoulder flexion 3/5 3/5  Shoulder extension    Shoulder abduction 3/5 3/5  Shoulder adduction    Shoulder extension    Shoulder internal rotation    Shoulder external rotation    Middle trapezius 3/5 3/5  Lower trapezius 3/5 3/5  Elbow flexion    Elbow extension    Wrist flexion    Wrist extension    Wrist ulnar deviation    Wrist radial deviation    Wrist pronation    Wrist supination    Grip strength     (Blank rows = not tested)  CERVICAL SPECIAL TESTS:  Neck flexor muscle endurance test: 5 sec  TREATMENT: [facility] Adult PT Treatment:                                                DATE: 09/17/2022 Therapeutic Exercise: Ext SNAG x 5 Seated bilateral ER x 5 GTB Row x 10 GTB Seated chin tuck x 5   PATIENT EDUCATION:  Education details: eval findings, NDI, HEP, POC Person educated: Patient Education method: Explanation, Demonstration, and Handouts Education comprehension: verbalized understanding and returned demonstration  HOME EXERCISE PROGRAM: Access Code: [email or web address] URL: [email or web address] Date: 09/17/2022 Prepared by: Octavio Manns  Exercises - cervical extension snag with towel  - 1 x daily - 7 x weekly - 2 sets - 10 reps - Shoulder External Rotation and Scapular Retraction with Resistance  - 1 x daily - 7 x weekly - 2 sets - 10 reps - green hold - Standing Shoulder Row with Anchored Resistance  - 1 x daily - 7 x weekly - 3 sets - 10 reps - green band hold - Seated  Cervical Retraction  - 1 x daily - 7 x weekly - 2 sets - 10 reps  ASSESSMENT:  CLINICAL IMPRESSION: Patient is a 57 y.o. M who was seen today for physical therapy evaluation and treatment for chronic neck pain.  Physical findings are consistent with referring provider impression as pt has decrease in cervical ROM and postural related deficits. His NDI indicates severe disability in performance  of home ADLs and community activity. Pt will benefit from skilled PT, will progress as able per POC.  OBJECTIVE IMPAIRMENTS: decreased activity tolerance, decreased mobility, difficulty walking, decreased ROM, decreased strength, and pain.   ACTIVITY LIMITATIONS: lifting, standing, dressing, reach over head, and locomotion level  PARTICIPATION LIMITATIONS: driving, shopping, community activity, and yard work  PERSONAL FACTORS: Fitness, Time since onset of injury/illness/exacerbation, and 1-2 comorbidities: HTN, PE  are also affecting patient's functional outcome.   REHAB POTENTIAL: Good - slightly guarded due to past response to therapy  CLINICAL DECISION MAKING: Stable/uncomplicated  EVALUATION COMPLEXITY: Low   GOALS: Goals reviewed with patient? No  SHORT TERM GOALS: Target date: 10/08/2022    Pt will be compliant and knowledgeable with initial HEP for improved comfort and carryover Baseline: initial HEP given  Goal status: INITIAL  2.  Pt will self report neck pain no greater than 7/10 for improved comfort and functional ability Baseline: 10/10 at worst Goal status: INITIAL   LONG TERM GOALS: Target date: 10/29/2022   Pt will decrease NDI disability score to no greater than 60% as proxy for functional improvement with home ADLs and community activity Baseline: 72% disability Goal status: INITIAL   2.  Pt will self report neck pain no greater than 3/10 for improved comfort and functional ability Baseline: 10/10 at worst Goal status: INITIAL   3.  Pt will improve cervical rotation AROM to no less than 45 degrees bilaterally for improved comfort and functional ability Baseline: see chart Goal status: INITIAL  4.  Pt will improve DNF strength hold time to no less than 15 seconds for improved postural endurance and comfort Baseline: 5 seconds Goal status: INITIAL  PLAN:  PT FREQUENCY: 2x/week  PT DURATION: 6 weeks  PLANNED INTERVENTIONS: Therapeutic exercises,  Therapeutic activity, Neuromuscular re-education, Balance training, Gait training, Patient/Family education, Self Care, Joint mobilization, Dry Needling, Electrical stimulation, Cryotherapy, Moist heat, and Re-evaluation  PLAN FOR NEXT SESSION: assess HEP response, DNF and periscapular strengthening  Check all possible CPT codes: [id number] - PT Re-evaluation, 97110- Therapeutic Exercise, [phone]- Neuro Re-education, [phone] - Gait Training, [phone] - Manual Therapy, 97530 - Therapeutic Activities, and 97535 - Self Care    Check all conditions that are expected to impact treatment: None of these apply   If treatment provided at initial evaluation, no treatment charged due to lack of authorization.        Ward Chatters, PT 09/18/2022, 1:37 PM

## 2022-09-24 ENCOUNTER — Telehealth: Payer: Self-pay

## 2022-09-24 ENCOUNTER — Ambulatory Visit: Payer: Medicaid Other | Attending: Physician Assistant

## 2022-09-24 NOTE — Telephone Encounter (Signed)
Attempted to call patient regarding missed visit, unable to leave VM due to VM not being set up.  1st no-show  Keith Potter, [place] 09/24/22 9:35 AM

## 2022-10-02 ENCOUNTER — Ambulatory Visit: Payer: Medicaid Other

## 2022-10-09 ENCOUNTER — Ambulatory Visit: Payer: Medicaid Other

## 2022-10-10 ENCOUNTER — Telehealth: Payer: Self-pay

## 2022-10-10 NOTE — Telephone Encounter (Signed)
PT called regarding 3rd no show but was again unable to leave voicemail. He will need new referral in future.   Eloy End, PT 10/10/22 2:35 PM

## 2022-10-14 ENCOUNTER — Ambulatory Visit: Payer: Medicaid Other

## 2022-11-01 ENCOUNTER — Ambulatory Visit (HOSPITAL_COMMUNITY)
Admission: EM | Admit: 2022-11-01 | Discharge: 2022-11-01 | Disposition: A | Discharge status: Home / Self Care | Payer: Medicaid Other | Attending: Family Medicine | Admitting: Family Medicine

## 2022-11-01 ENCOUNTER — Encounter (HOSPITAL_COMMUNITY): Payer: Self-pay | Admitting: Emergency Medicine

## 2022-11-01 ENCOUNTER — Other Ambulatory Visit: Payer: Self-pay

## 2022-11-01 DIAGNOSIS — W503XXA Accidental bite by another person, initial encounter: Secondary | ICD-10-CM

## 2022-11-01 DIAGNOSIS — S71159A Open bite, unspecified thigh, initial encounter: Secondary | ICD-10-CM

## 2022-11-01 HISTORY — DX: Other chronic pain: G89.29

## 2022-11-01 HISTORY — DX: Benign prostatic hyperplasia without lower urinary tract symptoms: N40.0

## 2022-11-01 HISTORY — DX: Pure hypercholesterolemia, unspecified: E78.00

## 2022-11-01 MED ORDER — AMOXICILLIN-POT CLAVULANATE 875-125 MG PO TABS: 1.0000 | ORAL_TABLET | Freq: Two times a day (BID) | ORAL | 0 refills | Status: DC

## 2022-11-01 NOTE — Discharge Instructions (Signed)
If he fever, worsening of swelling, increased pain after completion of antibiotics return here or go to the nearest ED for evaluation.
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
## 2022-11-14 ENCOUNTER — Other Ambulatory Visit: Payer: Self-pay

## 2022-11-14 ENCOUNTER — Other Ambulatory Visit: Payer: Self-pay | Admitting: Internal Medicine

## 2022-11-14 ENCOUNTER — Encounter: Payer: Self-pay | Admitting: Internal Medicine

## 2022-11-14 ENCOUNTER — Ambulatory Visit: Payer: Medicaid Other | Attending: Internal Medicine | Admitting: Internal Medicine

## 2022-11-14 VITALS — BP 114/74 | HR 60 | Temp 97.8°F | Ht 73.0 in | Wt 216.0 lb

## 2022-11-14 DIAGNOSIS — I10 Essential (primary) hypertension: Secondary | ICD-10-CM | POA: Diagnosis not present

## 2022-11-14 DIAGNOSIS — N183 Chronic kidney disease, stage 3 unspecified: Secondary | ICD-10-CM | POA: Insufficient documentation

## 2022-11-14 DIAGNOSIS — E785 Hyperlipidemia, unspecified: Secondary | ICD-10-CM

## 2022-11-14 DIAGNOSIS — R7303 Prediabetes: Secondary | ICD-10-CM | POA: Diagnosis not present

## 2022-11-14 DIAGNOSIS — R972 Elevated prostate specific antigen [PSA]: Secondary | ICD-10-CM

## 2022-11-14 DIAGNOSIS — M5416 Radiculopathy, lumbar region: Secondary | ICD-10-CM

## 2022-11-14 DIAGNOSIS — Z1211 Encounter for screening for malignant neoplasm of colon: Secondary | ICD-10-CM

## 2022-11-14 DIAGNOSIS — R350 Frequency of micturition: Secondary | ICD-10-CM

## 2022-11-14 DIAGNOSIS — I82409 Acute embolism and thrombosis of unspecified deep veins of unspecified lower extremity: Secondary | ICD-10-CM | POA: Diagnosis not present

## 2022-11-14 DIAGNOSIS — N401 Enlarged prostate with lower urinary tract symptoms: Secondary | ICD-10-CM

## 2022-11-14 LAB — POCT GLYCOSYLATED HEMOGLOBIN (HGB A1C): HbA1c, POC (prediabetic range): 5.9 % (ref 5.7–6.4)

## 2022-11-14 LAB — GLUCOSE, POCT (MANUAL RESULT ENTRY): POC Glucose: 117 mg/dl — AB (ref 70–99)

## 2022-11-14 MED ORDER — TAMSULOSIN HCL 0.4 MG PO CAPS
0.8000 mg | ORAL_CAPSULE | Freq: Every day | ORAL | 6 refills | Status: DC
  Filled 2022-11-14 (×2): qty 60, 30d supply, fill #0
  Filled 2023-01-08: qty 60, 30d supply, fill #1
  Filled 2023-02-05: qty 60, 30d supply, fill #2

## 2022-11-14 MED ORDER — PREGABALIN 100 MG PO CAPS
100.0000 mg | ORAL_CAPSULE | Freq: Two times a day (BID) | ORAL | 1 refills | Status: DC
  Filled 2023-01-08 (×2): qty 30, 15d supply, fill #0
  Filled 2023-02-05: qty 60, 30d supply, fill #1

## 2022-11-14 MED ORDER — ATORVASTATIN CALCIUM 80 MG PO TABS
80.0000 mg | ORAL_TABLET | Freq: Every day | ORAL | 1 refills | Status: DC
  Filled 2022-11-14: qty 90, 90d supply, fill #0

## 2022-11-14 MED ORDER — AMLODIPINE BESYLATE 5 MG PO TABS: 5.0000 mg | ORAL_TABLET | Freq: Every day | ORAL | 1 refills | Status: DC

## 2022-11-14 NOTE — Patient Instructions (Signed)
Please call [facility] to schedule your colonoscopy.  They tried calling you last month to schedule.   [address] [place], [place] [address] PH# [phone]
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
## 2022-11-15 LAB — LIPID PANEL
Chol/HDL Ratio: 4.1 ratio (ref 0.0–5.0)
Cholesterol, Total: 164 mg/dL (ref 100–199)
HDL: 40 mg/dL (ref >39)
LDL Chol Calc (NIH): 103 mg/dL — ABNORMAL HIGH (ref 0–99)
Triglycerides: 114 mg/dL (ref 0–149)
VLDL Cholesterol Cal: 21 mg/dL (ref 5–40)

## 2022-11-15 LAB — HEPATIC FUNCTION PANEL
ALT: 41 IU/L (ref 0–44)
AST: 28 IU/L (ref 0–40)
Albumin: 4.3 g/dL (ref 3.8–4.9)
Alkaline Phosphatase: 118 IU/L (ref 44–121)
Bilirubin Total: 0.5 mg/dL (ref 0.0–1.2)
Bilirubin, Direct: 0.14 mg/dL (ref 0.00–0.40)
Total Protein: 7.1 g/dL (ref 6.0–8.5)

## 2022-11-15 LAB — PSA: Prostate Specific Ag, Serum: 15.8 ng/mL — ABNORMAL HIGH (ref 0.0–4.0)

## 2022-11-20 ENCOUNTER — Telehealth: Payer: Self-pay | Admitting: *Deleted

## 2022-11-20 DIAGNOSIS — R972 Elevated prostate specific antigen [PSA]: Secondary | ICD-10-CM

## 2022-11-20 DIAGNOSIS — Z1211 Encounter for screening for malignant neoplasm of colon: Secondary | ICD-10-CM

## 2022-11-20 NOTE — Telephone Encounter (Addendum)
Patient was called and given lab results.  States he is taking the atorvastatin daily HS.  He would also like for his PCP to place the Urology referral and GI referral for colonoscopy.   He did not go to [facility]. Would like to go somewhere local for the colonoscopy.   Please advise.

## 2022-11-20 NOTE — Telephone Encounter (Signed)
Called & spoke with the patient. Verified name & DOB. Informed that referrals for Urology and Gastroenterology have been placed. Patient expressed verbal understanding. No further questions at this time.

## 2022-11-20 NOTE — Addendum Note (Signed)
Addended by: Jonah Blue B on: 11/20/2022 05:19 PM   Modules accepted: Orders

## 2022-11-20 NOTE — Telephone Encounter (Signed)
-----   Message from Marcine Matar, MD sent at 11/16/2022  6:04 PM EDT ----- Let patient know that his prostate level is still elevated but stable compared to a year ago.  [facility]  Let me know if he is willing for [place] to submit that referral. Cholesterol level is 103 with goal being less than 100.  Please make sure that he is taking the atorvastatin every day as prescribed. Liver function test normal.

## 2023-01-08 ENCOUNTER — Other Ambulatory Visit: Payer: Self-pay

## 2023-01-27 ENCOUNTER — Encounter (HOSPITAL_COMMUNITY): Payer: Self-pay

## 2023-01-27 ENCOUNTER — Other Ambulatory Visit: Payer: Self-pay

## 2023-01-27 ENCOUNTER — Emergency Department (HOSPITAL_COMMUNITY)
Admission: EM | Admit: 2023-01-27 | Discharge: 2023-01-27 | Disposition: A | Discharge status: Home / Self Care | Payer: Medicaid Other

## 2023-01-27 DIAGNOSIS — D72829 Elevated white blood cell count, unspecified: Secondary | ICD-10-CM | POA: Insufficient documentation

## 2023-01-27 DIAGNOSIS — Z79899 Other long term (current) drug therapy: Secondary | ICD-10-CM | POA: Insufficient documentation

## 2023-01-27 DIAGNOSIS — R339 Retention of urine, unspecified: Secondary | ICD-10-CM

## 2023-01-27 DIAGNOSIS — R300 Dysuria: Secondary | ICD-10-CM | POA: Insufficient documentation

## 2023-01-27 DIAGNOSIS — R1030 Lower abdominal pain, unspecified: Secondary | ICD-10-CM | POA: Diagnosis not present

## 2023-01-27 DIAGNOSIS — Z7901 Long term (current) use of anticoagulants: Secondary | ICD-10-CM | POA: Diagnosis not present

## 2023-01-27 DIAGNOSIS — I10 Essential (primary) hypertension: Secondary | ICD-10-CM | POA: Insufficient documentation

## 2023-01-27 LAB — CBC
HCT: 47.6 % (ref 39.0–52.0)
Hemoglobin: 16 g/dL (ref 13.0–17.0)
MCH: 30.7 pg (ref 26.0–34.0)
MCHC: 33.6 g/dL (ref 30.0–36.0)
MCV: 91.4 fL (ref 80.0–100.0)
Platelets: 153 10*3/uL (ref 150–400)
RBC: 5.21 MIL/uL (ref 4.22–5.81)
RDW: 12.4 % (ref 11.5–15.5)
WBC: 11.4 10*3/uL — ABNORMAL HIGH (ref 4.0–10.5)
nRBC: 0 % (ref 0.0–0.2)

## 2023-01-27 LAB — URINALYSIS, ROUTINE W REFLEX MICROSCOPIC
Bacteria, UA: NONE SEEN
Bilirubin Urine: NEGATIVE
Glucose, UA: >=500 mg/dL — AB
Ketones, ur: 20 mg/dL — AB
Leukocytes,Ua: NEGATIVE
Nitrite: NEGATIVE
Protein, ur: NEGATIVE mg/dL
RBC / HPF: >50 RBC/hpf (ref 0–5)
Specific Gravity, Urine: 1.011 (ref 1.005–1.030)
pH: 5 (ref 5.0–8.0)

## 2023-01-27 LAB — BASIC METABOLIC PANEL
Anion gap: 12 (ref 5–15)
BUN: 12 mg/dL (ref 6–20)
CO2: 21 mmol/L — ABNORMAL LOW (ref 22–32)
Calcium: 9.2 mg/dL (ref 8.9–10.3)
Chloride: 102 mmol/L (ref 98–111)
Creatinine, Ser: 1.04 mg/dL (ref 0.61–1.24)
GFR, Estimated: >60 mL/min (ref >60)
Glucose, Bld: 148 mg/dL — ABNORMAL HIGH (ref 70–99)
Potassium: 3.3 mmol/L — ABNORMAL LOW (ref 3.5–5.1)
Sodium: 135 mmol/L (ref 135–145)

## 2023-01-27 MED ORDER — POTASSIUM CHLORIDE CRYS ER 20 MEQ PO TBCR
20.0000 meq | EXTENDED_RELEASE_TABLET | Freq: Once | ORAL | Status: AC
  Administered 2023-01-27: 20 meq via ORAL
  Filled 2023-01-27: qty 2

## 2023-01-27 NOTE — ED Provider Notes (Signed)
EMERGENCY DEPARTMENT AT [facility] Provider Note   CSN: [id number] Arrival date & time: 01/27/23  1913     History  Chief Complaint  Patient presents with   Dysuria   Groin Pain    Pt bib ems for c/o groin pain and painful urination. Pt with a history of enlarged prostate. 10/10 aching pain. Pt states he is able to void but very little    Keith Potter is a 57 y.o. male.  57 year old male history of prostate issues on Flomax presenting emergency department for urinary retention.  States he started having more difficulty than usual urinating last night.  Has had increasing suprapubic discomfort.  Denies any recent dysuria, hematuria.  No fevers.  No nausea or vomiting.  Triage note mentions groin pain.  He says he is not having groin pain and only difficulty urinating.   Dysuria Presenting symptoms: dysuria   Associated symptoms: groin pain   Groin Pain       Home Medications Prior to Admission medications   Medication Sig Start Date End Date Taking? Authorizing Provider  amLODipine (NORVASC) 5 MG tablet Take 1 tablet (5 mg total) by mouth daily. 11/14/22   Marcine Matar, MD  amoxicillin-clavulanate (AUGMENTIN) 875-125 MG tablet Take 1 tablet by mouth every 12 (twelve) hours. 11/01/22   Bing Neighbors, NP  Ascorbic Acid (VITAMIN C) 1000 MG tablet Take 1,000 mg by mouth daily.    [provider]  atorvastatin (LIPITOR) 80 MG tablet Take 1 tablet (80 mg total) by mouth daily. 11/14/22   Marcine Matar, MD  cyclobenzaprine (FLEXERIL) 10 MG tablet Take 1 tablet (10 mg total) by mouth 2 (two) times daily as needed for muscle spasms. 02/19/22   Marcine Matar, MD  Multiple Vitamin (MULTIVITAMIN WITH MINERALS) TABS tablet Take 1 tablet by mouth daily.    [provider]  pregabalin (LYRICA) 100 MG capsule Take 1 capsule (100 mg total) by mouth 2 (two) times daily. 11/14/22   Marcine Matar, MD  rivaroxaban (XARELTO) 20 MG TABS  tablet Take 1 tablet (20 mg total) by mouth daily with supper. 08/15/22   Marcine Matar, MD  tamsulosin (FLOMAX) 0.4 MG CAPS capsule Take 2 capsules (0.8 mg total) by mouth daily. 11/14/22   Marcine Matar, MD      Allergies    Patient has no known allergies.    Review of Systems   Review of Systems  Genitourinary:  Positive for dysuria.  All other systems reviewed and are negative.   Physical Exam Updated Vital Signs BP (!) 152/84 (BP Location: Right Arm)   Pulse 84   Temp 98 F (36.7 C) (Oral)   Resp 20   Ht 6\' 1"  (1.854 m)   Wt 104.3 kg   SpO2 100%   BMI 30.34 kg/m  Physical Exam Vitals and nursing note reviewed.  Constitutional:      General: He is not in acute distress.    Appearance: He is not toxic-appearing.  HENT:     Head: Normocephalic.     Nose: Nose normal.     Mouth/Throat:     Mouth: Mucous membranes are moist.  Eyes:     Conjunctiva/sclera: Conjunctivae normal.  Cardiovascular:     Rate and Rhythm: Normal rate and regular rhythm.  Pulmonary:     Effort: Pulmonary effort is normal.     Breath sounds: Normal breath sounds.  Abdominal:     General: Abdomen is flat.  There is no distension.     Tenderness: There is abdominal tenderness (suprapubic tenderness). There is no guarding or rebound.  Musculoskeletal:        General: Normal range of motion.  Skin:    General: Skin is warm and dry.     Capillary Refill: Capillary refill takes less than 2 seconds.  Neurological:     Mental Status: He is alert and oriented to person, place, and time.  Psychiatric:        Mood and Affect: Mood normal.        Behavior: Behavior normal.     ED Results / Procedures / Treatments   Labs (all labs ordered are listed, but only abnormal results are displayed) Labs Reviewed  CBC - Abnormal; Notable for the following components:      Result Value   WBC 11.4 (*)    All other components within normal limits  BASIC METABOLIC PANEL - Abnormal; Notable for the  following components:   Potassium 3.3 (*)    CO2 21 (*)    Glucose, Bld 148 (*)    All other components within normal limits  URINALYSIS, ROUTINE W REFLEX MICROSCOPIC - Abnormal; Notable for the following components:   Glucose, UA >=500 (*)    Hgb urine dipstick LARGE (*)    Ketones, ur 20 (*)    All other components within normal limits    EKG None  Radiology No results found.  Procedures Procedures    Medications Ordered in ED Medications  potassium chloride SA (KLOR-CON M) CR tablet 20 mEq (has no administration in time range)    ED Course/ Medical Decision Making/ A&P Clinical Course as of 01/27/23 2058  [facility] Jan 27, 2023  1925 Per PCP visit on 523/24 "BPH/elev PSA:  passing urine okay on Flomax.  Request RF. Wakes once at nights to urinate Has not seen urology since prostate bx 08/2021." [TY]  1950 Foley placed, reports feeling much improved.  His ~250 mL of pink-tinged urine.  Nurse reported there was some trauma with insertion as he was a difficult Foley. [TY]  2040 Basic metabolic panel(!) Mildly low potassium.  No evidence of kidney injury [TY]  2040 CBC(!) Mild leukocytosis.  Nonspecific, reactionary?.  Does not appear to be systemically ill. [TY]  2054 Urinalysis, Routine w reflex microscopic -Urine, Clean Catch(!) No evidence of UTI. [TY]  2056 Labs overall reassuring.  Mild low potassium, repleted.  Feeling better after Foley placed.  Low suspicion for post obstructive diuresis.  Will give patient follow-up with urology.  Stable for discharge. [TY]    Clinical Course User Index [TY] Coral Spikes, DO                                 Medical Decision Making 57 year old male present emergency department urinary retention in the setting of prostate issues.  He is afebrile nontachycardic slightly hypertensive.  Physical exam reassuring with soft nonsurgical abdomen.  Will get basic labs to evaluate for infection and renal injury.  Will place Foley catheter given  his complaint of urinary retention.  See ED course for further MDM and disposition.  Amount and/or Complexity of Data Reviewed External Data Reviewed: notes.    Details: See ED course Labs: ordered. Decision-making details documented in ED Course. Radiology:     Details: Consider labs, however patient with known prostate issues and has had some issues urinating some time.  Low  suspicion that CT scan would change management or disposition         Final Clinical Impression(s) / ED Diagnoses Final diagnoses:  Urinary retention    Rx / DC Orders ED Discharge Orders     None         Coral Spikes, DO 01/27/23 2058

## 2023-01-27 NOTE — ED Notes (Signed)
Discharge papers reviewed with pt. Teaching regarding foley leg bag and standard drainage bag reviewed with pt, pt verbalized understanding. Pt ambulatory from ED

## 2023-01-27 NOTE — Discharge Instructions (Addendum)
Please keep Foley catheter in until you are able to see urology.  Please follow-up with your primary doctor soon as possible.  Return [place] fevers, chills, Foley stops draining, drains blood, develop worsening abdominal pain or you develop any new or worsening symptoms

## 2023-02-05 ENCOUNTER — Other Ambulatory Visit: Payer: Self-pay

## 2023-02-05 MED ORDER — SILDENAFIL CITRATE 100 MG PO TABS
50.0000 mg | ORAL_TABLET | ORAL | 3 refills | Status: AC | PRN
  Filled 2023-02-05: qty 30, 30d supply, fill #0
  Filled 2024-01-19: qty 30, 30d supply, fill #1

## 2023-03-26 ENCOUNTER — Other Ambulatory Visit: Payer: Self-pay | Admitting: Internal Medicine

## 2023-03-26 DIAGNOSIS — Z86711 Personal history of pulmonary embolism: Secondary | ICD-10-CM

## 2023-03-26 DIAGNOSIS — I10 Essential (primary) hypertension: Secondary | ICD-10-CM

## 2023-03-26 DIAGNOSIS — I82409 Acute embolism and thrombosis of unspecified deep veins of unspecified lower extremity: Secondary | ICD-10-CM

## 2023-03-26 NOTE — Telephone Encounter (Signed)
Patient called, no answer, no mailbox. Calling to verify if patient needs Cialis or sildenafil (Viagra) 100 mg, because Cialis is not on his current or past medication list.

## 2023-03-26 NOTE — Telephone Encounter (Signed)
Medication Refill - Medication: Xarelto 20 mg /Amlodipine  5mg /atorvastatin (LIPITOR) 80 MG tablet / flomax 4mg / pregablin 100mg /Cialis 100mg   Pt states medicine he lost these meds  Has the patient contacted their pharmacy? No Pt didn't think pharmacy would assist him  Preferred Pharmacy (with phone number or street name):  Has the patient been seen for an appointment in the last year OR does the patient have an upcoming appointment? yes  Agent: Please be advised that RX refills may take up to 3 business days. We ask that you follow-up with your pharmacy.

## 2023-03-27 ENCOUNTER — Other Ambulatory Visit: Payer: Self-pay

## 2023-03-27 MED ORDER — PREGABALIN 100 MG PO CAPS
100.0000 mg | ORAL_CAPSULE | Freq: Two times a day (BID) | ORAL | 3 refills | Status: AC
  Filled 2023-03-27: qty 60, 30d supply, fill #0

## 2023-03-27 MED ORDER — ATORVASTATIN CALCIUM 80 MG PO TABS
80.0000 mg | ORAL_TABLET | Freq: Every day | ORAL | 0 refills | Status: DC
  Filled 2023-03-27: qty 90, 90d supply, fill #0

## 2023-03-27 MED ORDER — RIVAROXABAN 20 MG PO TABS
20.0000 mg | ORAL_TABLET | Freq: Every day | ORAL | 0 refills | Status: DC
  Filled 2023-03-27: qty 90, 90d supply, fill #0

## 2023-03-27 MED ORDER — AMLODIPINE BESYLATE 5 MG PO TABS
5.0000 mg | ORAL_TABLET | Freq: Every day | ORAL | 0 refills | Status: DC
  Filled 2023-03-27: qty 90, 90d supply, fill #0

## 2023-03-27 NOTE — Telephone Encounter (Signed)
Requested medication (s) are due for refill today: Yes  Requested medication (s) are on the active medication list: Yes  Last refill:  11/14/22  Future visit scheduled: No  Notes to clinic:  Not delegated.    Requested Prescriptions  Pending Prescriptions Disp Refills   pregabalin (LYRICA) 100 MG capsule 60 capsule 1    Sig: Take 1 capsule (100 mg total) by mouth 2 (two) times daily.     Not Delegated - Neurology:  Anticonvulsants - Controlled - pregabalin Failed - 03/26/2023  5:09 PM      Failed - This refill cannot be delegated      Passed - Cr in normal range and within 360 days    Creatinine, Ser  Date Value Ref Range Status  01/27/2023 1.04 0.61 - 1.24 mg/dL Final         Passed - Completed PHQ-2 or PHQ-9 in the last 360 days      Passed - Valid encounter within last 12 months    Recent Outpatient Visits           4 months ago Essential hypertension   [facility] & [facility] Marcine Matar, MD   8 months ago Essential hypertension   [facility] Marcine Matar, MD   1 year ago Essential hypertension   [place] Marcine Matar, MD   1 year ago Need for zoster vaccination   [facility] Drucilla Chalet, RPH-CPP   1 year ago Essential hypertension   [place] [facility] Marcine Matar, MD              Signed Prescriptions Disp Refills   rivaroxaban (XARELTO) 20 MG TABS tablet 90 tablet 0    Sig: Take 1 tablet (20 mg total) by mouth daily with supper.     Hematology: Anticoagulants - rivaroxaban Passed - 03/26/2023  5:09 PM      Passed - ALT in normal range and within 360 days    ALT  Date Value Ref Range Status  11/14/2022 41 0 - 44 IU/L Final         Passed - AST in normal range and within 360 days    AST  Date Value Ref Range Status  11/14/2022 28 0 - 40 IU/L Final          Passed - Cr in normal range and within 360 days    Creatinine, Ser  Date Value Ref Range Status  01/27/2023 1.04 0.61 - 1.24 mg/dL Final         Passed - HCT in normal range and within 360 days    HCT  Date Value Ref Range Status  01/27/2023 47.6 39.0 - 52.0 % Final   Hematocrit  Date Value Ref Range Status  02/19/2022 46.0 37.5 - 51.0 % Final         Passed - HGB in normal range and within 360 days    Hemoglobin  Date Value Ref Range Status  01/27/2023 16.0 13.0 - 17.0 g/dL Final  16/03/9603 54.0 13.0 - 17.7 g/dL Final         Passed - PLT in normal range and within 360 days    Platelets  Date Value Ref Range Status  01/27/2023 153 150 - 400 K/uL Final  02/19/2022 154 150 - 450 x10E3/uL Final  Passed - eGFR is 15 or above and within 360 days    GFR calc Af Amer  Date Value Ref Range Status  04/01/2019 >60 >60 mL/min Final   GFR, Estimated  Date Value Ref Range Status  01/27/2023 >60 >60 mL/min Final    Comment:    (NOTE) Calculated using the CKD-EPI Creatinine Equation (2021)    eGFR  Date Value Ref Range Status  10/19/2021 100 >59 mL/min/1.73 Final         Passed - Patient is not pregnant      Passed - Valid encounter within last 12 months    Recent Outpatient Visits           4 months ago Essential hypertension   [place] [facility] Marcine Matar, MD   8 months ago Essential hypertension   [place] [facility] Marcine Matar, MD   1 year ago Essential hypertension   [place] [facility] Marcine Matar, MD   1 year ago Need for zoster vaccination   [facility] Drucilla Chalet, RPH-CPP   1 year ago Essential hypertension   [place] [facility] Jonah Blue B, MD               amLODipine (NORVASC) 5 MG tablet 90 tablet 0    Sig: Take 1 tablet (5 mg total) by mouth daily.      Cardiovascular: Calcium Channel Blockers 2 Failed - 03/26/2023  5:09 PM      Failed - Last BP in normal range    BP Readings from Last 1 Encounters:  01/27/23 (!) 152/84         Passed - Last Heart Rate in normal range    Pulse Readings from Last 1 Encounters:  01/27/23 90         Passed - Valid encounter within last 6 months    Recent Outpatient Visits           4 months ago Essential hypertension   [place] [facility] Marcine Matar, MD   8 months ago Essential hypertension   [place] Marcine Matar, MD   1 year ago Essential hypertension   [place] [facility] Marcine Matar, MD   1 year ago Need for zoster vaccination   [facility] Drucilla Chalet, RPH-CPP   1 year ago Essential hypertension   [place] [facility] & [facility] Jonah Blue B, MD               atorvastatin (LIPITOR) 80 MG tablet 90 tablet 0    Sig: Take 1 tablet (80 mg total) by mouth daily.     Cardiovascular:  Antilipid - Statins Failed - 03/26/2023  5:09 PM      Failed - Lipid Panel in normal range within the last 12 months    Cholesterol, Total  Date Value Ref Range Status  11/14/2022 164 100 - 199 mg/dL Final   LDL Chol Calc (NIH)  Date Value Ref Range Status  11/14/2022 103 (H) 0 - 99 mg/dL Final   HDL  Date Value Ref Range Status  11/14/2022 40 >39 mg/dL Final   Triglycerides  Date Value Ref Range Status  11/14/2022 114 0 - 149 mg/dL Final  Passed - Patient is not pregnant      Passed - Valid encounter within last 12 months    Recent Outpatient Visits           4 months ago Essential hypertension   [place] [facility] Marcine Matar, MD   8 months ago Essential hypertension   [place] [facility] Marcine Matar, MD   1 year ago Essential hypertension    [place] [facility] Marcine Matar, MD   1 year ago Need for zoster vaccination   [facility] Drucilla Chalet, RPH-CPP   1 year ago Essential hypertension   [place] [facility] Marcine Matar, MD

## 2023-03-27 NOTE — Telephone Encounter (Signed)
Requested Prescriptions  Pending Prescriptions Disp Refills   rivaroxaban (XARELTO) 20 MG TABS tablet 90 tablet 0    Sig: Take 1 tablet (20 mg total) by mouth daily with supper.     Hematology: Anticoagulants - rivaroxaban Passed - 03/26/2023  5:09 PM      Passed - ALT in normal range and within 360 days    ALT  Date Value Ref Range Status  11/14/2022 41 0 - 44 IU/L Final         Passed - AST in normal range and within 360 days    AST  Date Value Ref Range Status  11/14/2022 28 0 - 40 IU/L Final         Passed - Cr in normal range and within 360 days    Creatinine, Ser  Date Value Ref Range Status  01/27/2023 1.04 0.61 - 1.24 mg/dL Final         Passed - HCT in normal range and within 360 days    HCT  Date Value Ref Range Status  01/27/2023 47.6 39.0 - 52.0 % Final   Hematocrit  Date Value Ref Range Status  02/19/2022 46.0 37.5 - 51.0 % Final         Passed - HGB in normal range and within 360 days    Hemoglobin  Date Value Ref Range Status  01/27/2023 16.0 13.0 - 17.0 g/dL Final  16/03/9603 54.0 13.0 - 17.7 g/dL Final         Passed - PLT in normal range and within 360 days    Platelets  Date Value Ref Range Status  01/27/2023 153 150 - 400 K/uL Final  02/19/2022 154 150 - 450 x10E3/uL Final         Passed - eGFR is 15 or above and within 360 days    GFR calc Af Amer  Date Value Ref Range Status  04/01/2019 >60 >60 mL/min Final   GFR, Estimated  Date Value Ref Range Status  01/27/2023 >60 >60 mL/min Final    Comment:    (NOTE) Calculated using the CKD-EPI Creatinine Equation (2021)    eGFR  Date Value Ref Range Status  10/19/2021 100 >59 mL/min/1.73 Final         Passed - Patient is not pregnant      Passed - Valid encounter within last 12 months    Recent Outpatient Visits           4 months ago Essential hypertension   [place] [facility] Marcine Matar, MD   8 months ago Essential hypertension   [place]  [facility]ine Matar, MD   1 year ago Essential hypertension    [place] Marcine Matar, MD   1 year ago Need for zoster vaccination   [facility] Drucilla Chalet, RPH-CPP   1 year ago Essential hypertension    [facility] Jonah Blue B, MD               amLODipine (NORVASC) 5 MG tablet 90 tablet 0    Sig: Take 1 tablet (5 mg total) by mouth daily.     Cardiovascular: Calcium Channel Blockers 2 Failed - 03/26/2023  5:09 PM      Failed - Last BP in normal range    BP Readings from Last 1 Encounters:  01/27/23 (!) 152/84  Passed - Last Heart Rate in normal range    Pulse Readings from Last 1 Encounters:  01/27/23 90         Passed - Valid encounter within last 6 months    Recent Outpatient Visits           4 months ago Essential hypertension   [place] [facility] Marcine Matar, MD   8 months ago Essential hypertension   [place] [facility] Marcine Matar, MD   1 year ago Essential hypertension   [place] [facility] Marcine Matar, MD   1 year ago Need for zoster vaccination   [facility] & [facility] Drucilla Chalet, RPH-CPP   1 year ago Essential hypertension   [place] [facility] Jonah Blue B, MD               atorvastatin (LIPITOR) 80 MG tablet 90 tablet 0    Sig: Take 1 tablet (80 mg total) by mouth daily.     Cardiovascular:  Antilipid - Statins Failed - 03/26/2023  5:09 PM      Failed - Lipid Panel in normal range within the last 12 months    Cholesterol, Total  Date Value Ref Range Status  11/14/2022 164 100 - 199 mg/dL Final   LDL Chol Calc (NIH)  Date Value Ref Range Status  11/14/2022 103 (H) 0 - 99 mg/dL Final   HDL  Date Value Ref  Range Status  11/14/2022 40 >39 mg/dL Final   Triglycerides  Date Value Ref Range Status  11/14/2022 114 0 - 149 mg/dL Final         Passed - Patient is not pregnant      Passed - Valid encounter within last 12 months    Recent Outpatient Visits           4 months ago Essential hypertension   [place] [facility] Marcine Matar, MD   8 months ago Essential hypertension   [facility] Marcine Matar, MD   1 year ago Essential hypertension   [place] [facility] Marcine Matar, MD   1 year ago Need for zoster vaccination   [facility] [place], Cornelius Moras, RPH-CPP   1 year ago Essential hypertension   [place] [facility] Jonah Blue B, MD               pregabalin (LYRICA) 100 MG capsule 60 capsule 1    Sig: Take 1 capsule (100 mg total) by mouth 2 (two) times daily.     Not Delegated - Neurology:  Anticonvulsants - Controlled - pregabalin Failed - 03/26/2023  5:09 PM      Failed - This refill cannot be delegated      Passed - Cr in normal range and within 360 days    Creatinine, Ser  Date Value Ref Range Status  01/27/2023 1.04 0.61 - 1.24 mg/dL Final         Passed - Completed PHQ-2 or PHQ-9 in the last 360 days      Passed - Valid encounter within last 12 months    Recent Outpatient Visits           4 months ago Essential hypertension   [facility]  [facility] Marcine Matar, MD   8 months ago Essential hypertension   [place] [facility] Marcine Matar, MD   1 year ago Essential hypertension   [place] [facility] Marcine Matar, MD   1 year ago Need for zoster vaccination   [facility] Drucilla Chalet, RPH-CPP   1 year ago Essential hypertension   [place]  [facility] Marcine Matar, MD

## 2023-03-28 ENCOUNTER — Other Ambulatory Visit: Payer: Self-pay

## 2023-04-08 ENCOUNTER — Other Ambulatory Visit: Payer: Self-pay

## 2023-05-22 IMAGING — CT CT ANGIO CHEST
2 of 8 series · 19 of 36 positions shown · IV contrast (Omnipaque)
Comparison: 03/26/2019

CLINICAL DATA: Rule out pulmonary embolus. Patient complains of
chest pain and SOB.

EXAM:
CT ANGIOGRAPHY CHEST WITH CONTRAST
TECHNIQUE: Multidetector CT imaging of the chest was performed using the
standard protocol during bolus administration of intravenous
contrast. Multiplanar CT image reconstructions and MIPs were
obtained to evaluate the vascular anatomy.

[Series 6: pe thins · axial · 0.77mm/px · z∈[-310,-48]mm · 18 of 295 slices shown]
[im 16/295  lung]
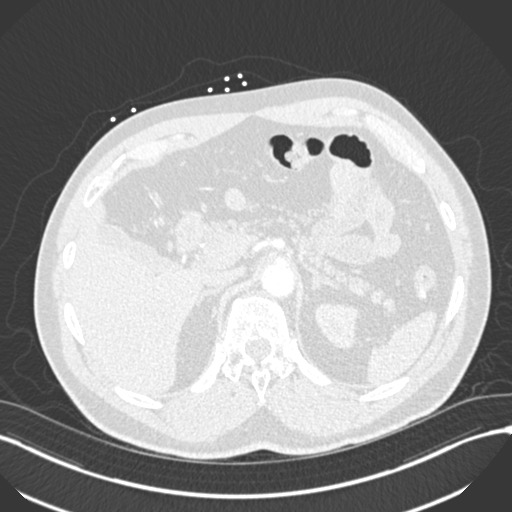
[im 31/295  mediastinal]
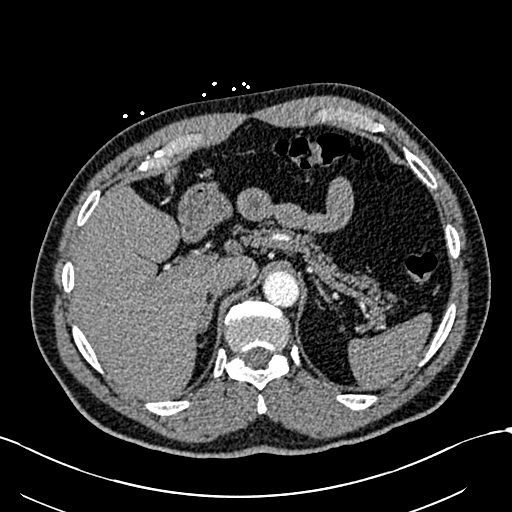
[im 47/295  lung]
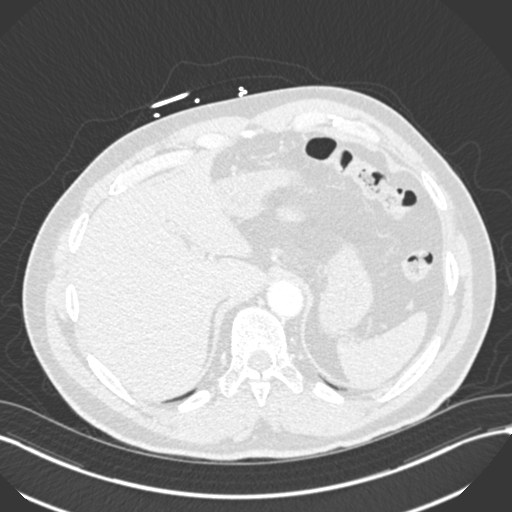
[im 62/295  mediastinal]
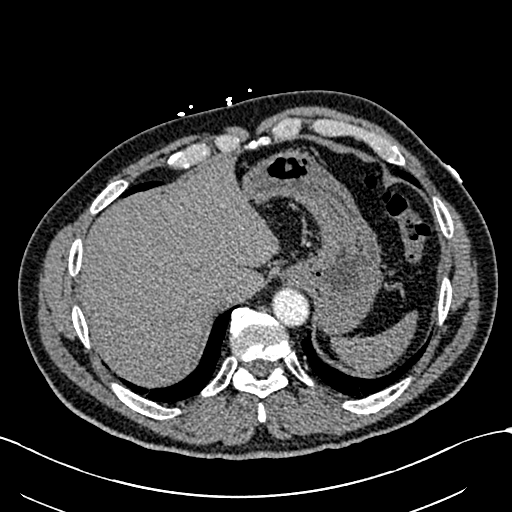
[im 78/295  lung]
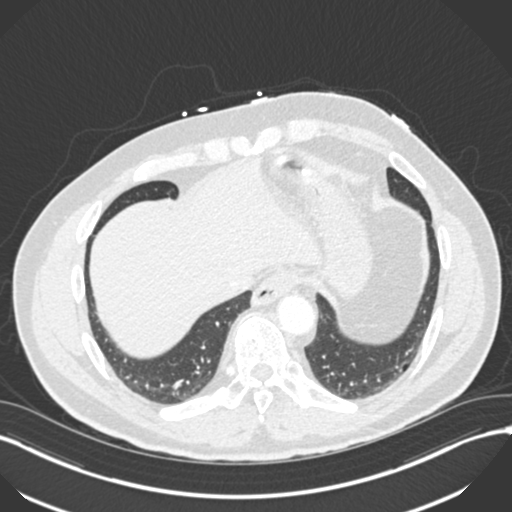
[im 93/295  mediastinal]
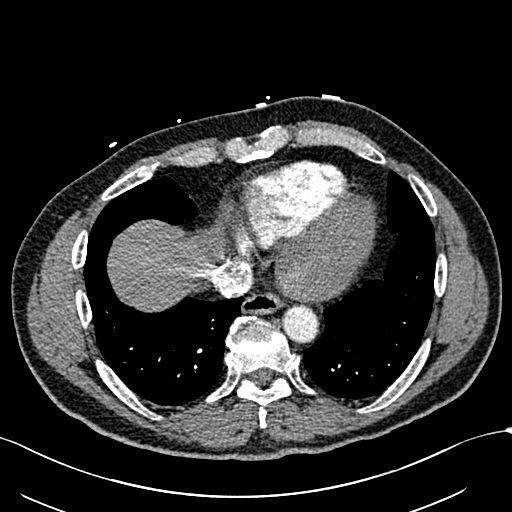
[im 109/295  lung]
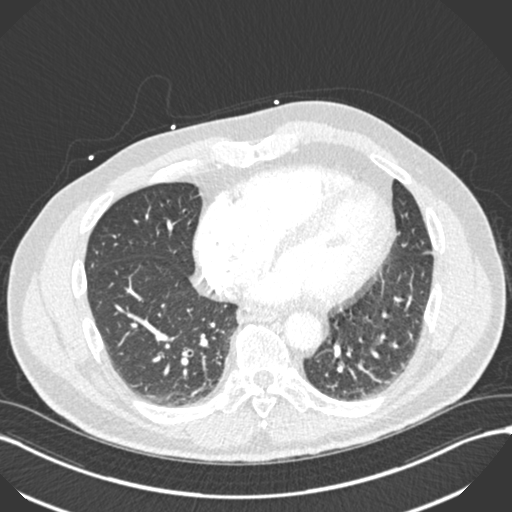
[im 124/295  mediastinal]
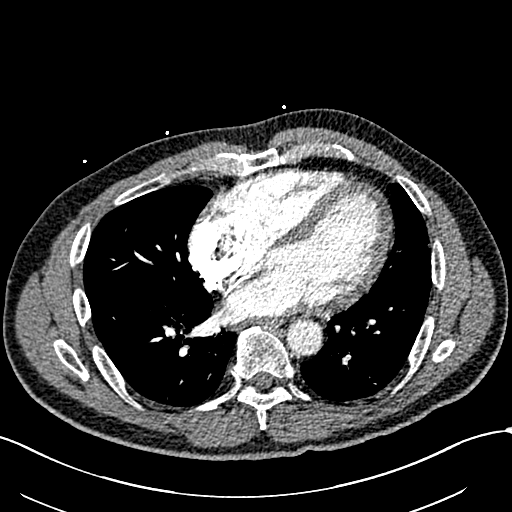
[im 140/295  lung]
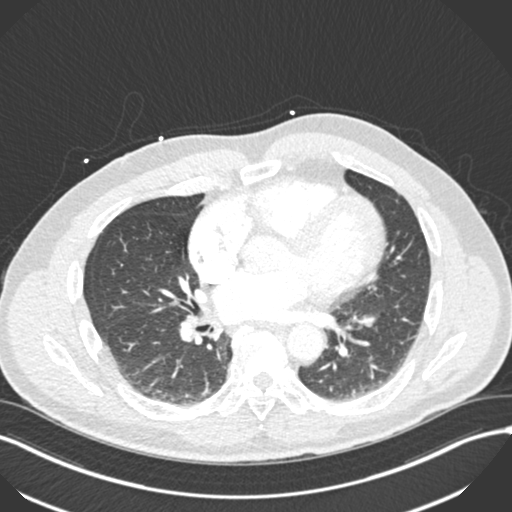
[im 155/295  mediastinal]
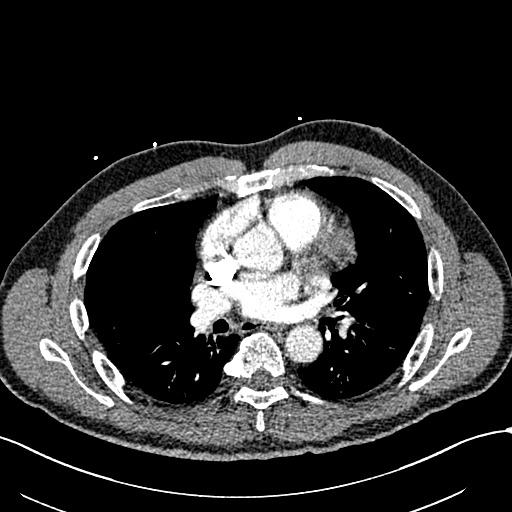
[im 171/295  lung]
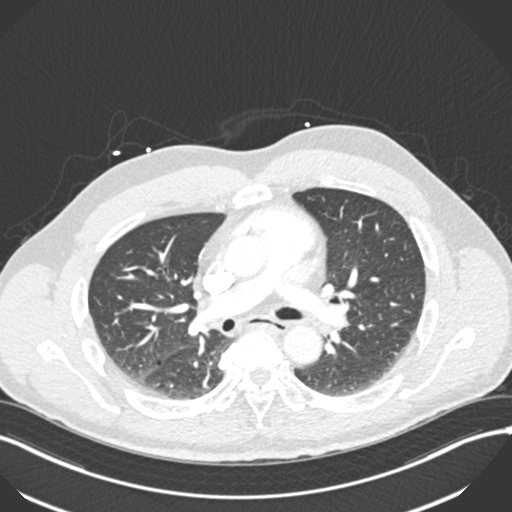
[im 186/295  mediastinal]
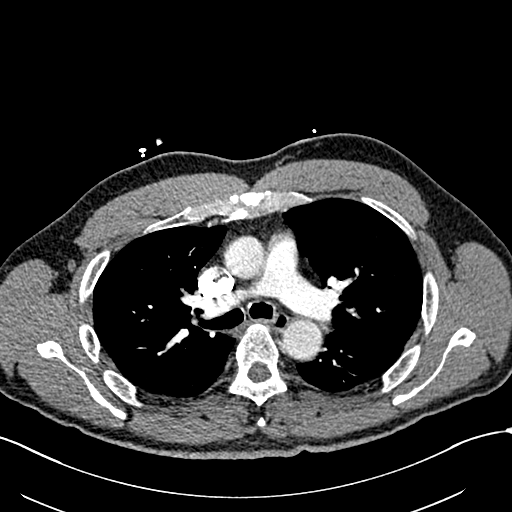
[im 202/295  lung]
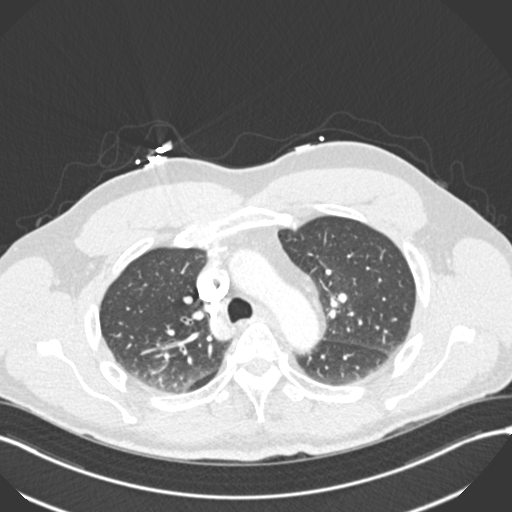
[im 217/295  mediastinal]
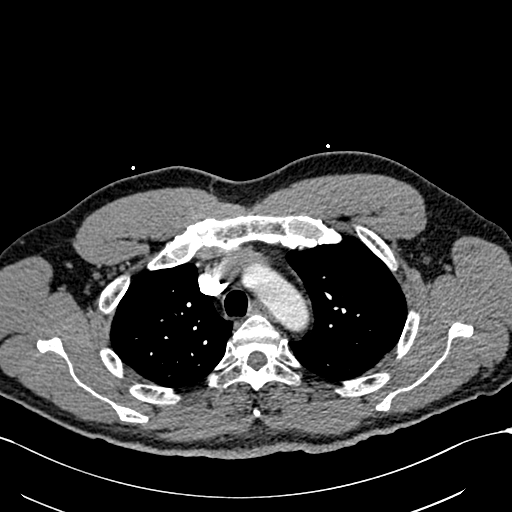
[im 233/295  lung]
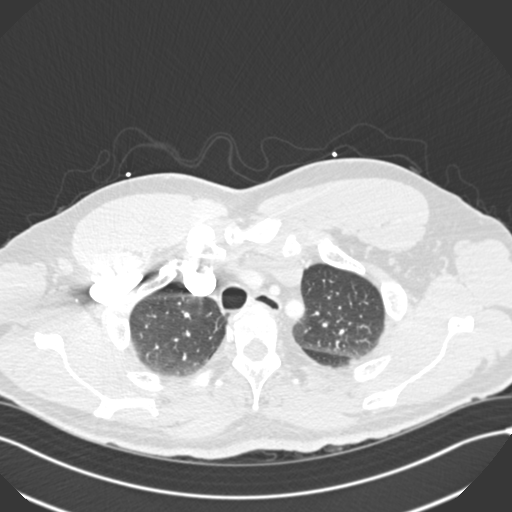
[im 248/295  mediastinal]
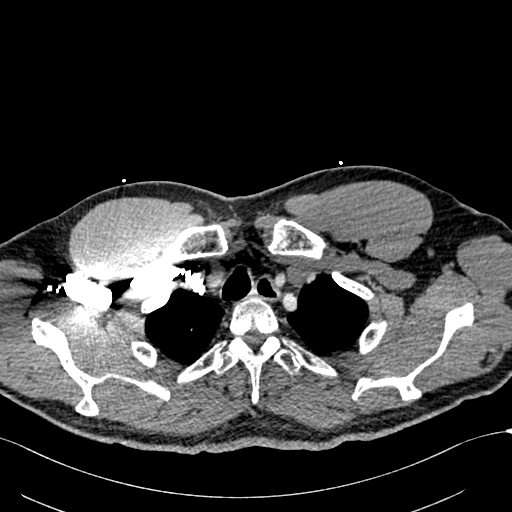
[im 264/295  lung]
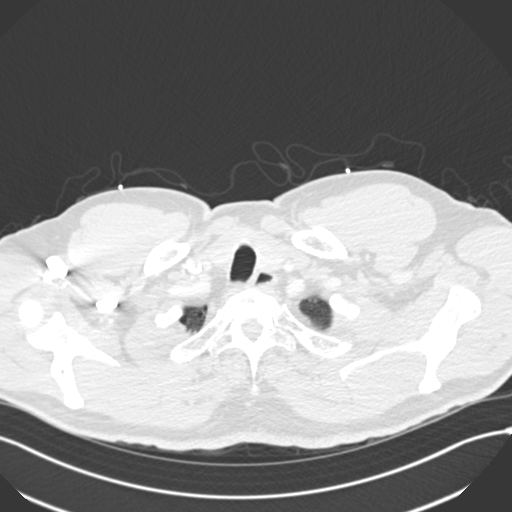
[im 279/295  mediastinal]
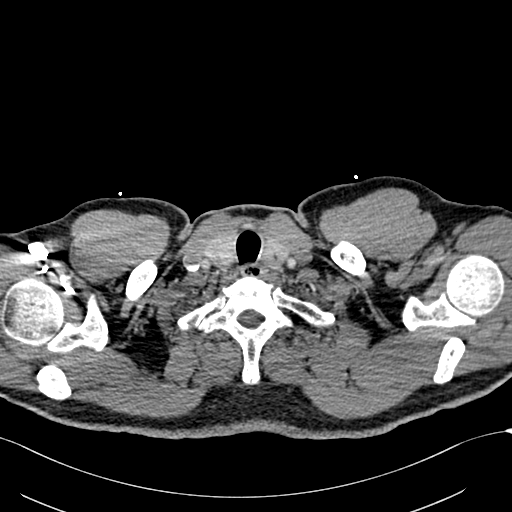

[Series 7: pe coronal mpr · coronal · 0.59mm/px · 1 of 151 slices shown]
[im 76/151  mediastinal]
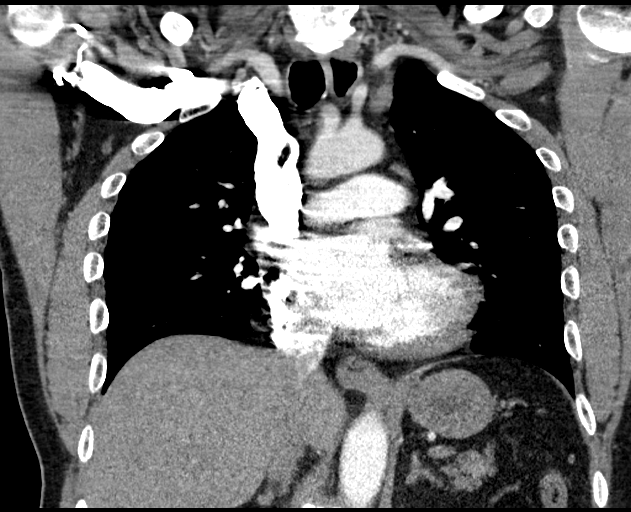

[19 of 36 positions shown; findings below may reference images not displayed]

RADIATION DOSE REDUCTION: This exam was performed according to the
departmental dose-optimization program which includes automated
exposure control, adjustment of the mA and/or kV according to
patient size and/or use of iterative reconstruction technique.

CONTRAST:  100mL OMNIPAQUE IOHEXOL 350 MG/ML SOLN
FINDINGS: Cardiovascular: Satisfactory opacification of the pulmonary arteries
to the segmental level. No evidence of pulmonary embolism. Normal
heart size. No pericardial effusion.

Mediastinum/Nodes: No enlarged mediastinal, hilar, or axillary lymph
nodes. Thyroid gland, trachea, and esophagus demonstrate no
significant findings.

Lungs/Pleura: No pleural effusion, airspace consolidation, or
atelectasis. No pneumothorax identified. Mild paraseptal emphysema.
No suspicious lung nodules.

Upper Abdomen: No acute abnormality.

Musculoskeletal: No chest wall abnormality. No acute or significant
osseous findings.

Review of the MIP images confirms the above findings.
IMPRESSION: 1. No evidence for acute pulmonary embolism. No acute
cardiopulmonary abnormalities to account for patient's chest pain
and shortness of breath.
2. Emphysema ([id number]).

## 2023-08-04 ENCOUNTER — Other Ambulatory Visit: Payer: Self-pay

## 2023-08-05 ENCOUNTER — Other Ambulatory Visit: Payer: Self-pay

## 2023-11-18 ENCOUNTER — Telehealth: Payer: Self-pay | Admitting: Internal Medicine

## 2023-11-18 DIAGNOSIS — M62830 Muscle spasm of back: Secondary | ICD-10-CM

## 2023-11-18 DIAGNOSIS — N401 Enlarged prostate with lower urinary tract symptoms: Secondary | ICD-10-CM

## 2023-11-18 DIAGNOSIS — I10 Essential (primary) hypertension: Secondary | ICD-10-CM

## 2023-11-18 DIAGNOSIS — Z86711 Personal history of pulmonary embolism: Secondary | ICD-10-CM

## 2023-11-18 DIAGNOSIS — I82409 Acute embolism and thrombosis of unspecified deep veins of unspecified lower extremity: Secondary | ICD-10-CM

## 2023-11-18 NOTE — Telephone Encounter (Unsigned)
 Copied from CRM [id number]. Topic: Clinical - Medication Refill >> Nov 18, 2023 11:23 AM Ethelle Herb L wrote: Medication:  tamsulosin  (FLOMAX ) 0.4 MG CAPS capsule rivaroxaban  (XARELTO ) 20 MG TABS tablet cyclobenzaprine  (FLEXERIL ) 10 MG tablet  pregabalin  (LYRICA ) 100 MG capsule Tramadol  50mg   amLODipine  (NORVASC ) 5 MG tablet atorvastatin  (LIPITOR) 80 MG tablet Patient requesting courtesy refills. Pt scheduled OV w/ Dr. Lincoln Renshaw for 01/13/2024.  Has the patient contacted their pharmacy? Yes Pt out of refills told to contact office.  This is the patient's preferred pharmacy:  [facility] [address] [place] [address] Phone: [phone] Fax: [phone]  Is this the correct pharmacy for this prescription? Yes  Has the prescription been filled recently? No  Is the patient out of the medication? Yes  Has the patient been seen for an appointment in the last year OR does the patient have an upcoming appointment? Yes  Can we respond through MyChart? No  Agent: Please be advised that Rx refills may take up to 3 business days. We ask that you follow-up with your pharmacy.

## 2023-11-20 ENCOUNTER — Other Ambulatory Visit: Payer: Self-pay

## 2023-11-20 ENCOUNTER — Other Ambulatory Visit: Payer: Self-pay | Admitting: Pharmacist

## 2023-11-20 MED ORDER — TAMSULOSIN HCL 0.4 MG PO CAPS
0.8000 mg | ORAL_CAPSULE | Freq: Every day | ORAL | 6 refills | Status: AC
  Filled 2023-11-20 – 2024-01-19 (×2): qty 60, 30d supply, fill #0

## 2023-11-20 MED ORDER — AMLODIPINE BESYLATE 5 MG PO TABS
5.0000 mg | ORAL_TABLET | Freq: Every day | ORAL | 0 refills | Status: DC
  Filled 2023-11-20 – 2024-01-19 (×2): qty 90, 90d supply, fill #0

## 2023-11-20 MED ORDER — RIVAROXABAN 20 MG PO TABS
20.0000 mg | ORAL_TABLET | Freq: Every day | ORAL | 0 refills | Status: DC
  Filled 2023-11-20 – 2024-01-19 (×2): qty 90, 90d supply, fill #0

## 2023-11-20 MED ORDER — ATORVASTATIN CALCIUM 80 MG PO TABS
80.0000 mg | ORAL_TABLET | Freq: Every day | ORAL | 0 refills | Status: DC
  Filled 2023-11-20 – 2024-01-19 (×2): qty 90, 90d supply, fill #0

## 2023-11-20 NOTE — Progress Notes (Signed)
 Opened in error

## 2023-11-20 NOTE — Telephone Encounter (Signed)
 Requested medication (s) are due for refill today: Yes  Requested medication (s) are on the active medication list: Yes  Last refill:  03/27/23  Future visit scheduled: Yes  Notes to clinic:  Unable to refill per protocol due to failed labs, no updated results, cannot delegate.     Requested Prescriptions  Pending Prescriptions Disp Refills   tamsulosin  (FLOMAX ) 0.4 MG CAPS capsule 60 capsule 6    Sig: Take 2 capsules (0.8 mg total) by mouth daily.     Urology: Alpha-Adrenergic Blocker Failed - 11/20/2023  3:49 PM      Failed - PSA in normal range and within 360 days    Prostate Specific Ag, Serum  Date Value Ref Range Status  11/14/2022 15.8 (H) 0.0 - 4.0 ng/mL Final    Comment:    Roche ECLIA methodology. According to the American Urological Association, Serum PSA should decrease and remain at undetectable levels after radical prostatectomy. The AUA defines biochemical recurrence as an initial PSA value 0.2 ng/mL or greater followed by a subsequent confirmatory PSA value 0.2 ng/mL or greater. Values obtained with different assay methods or kits cannot be used interchangeably. Results cannot be interpreted as absolute evidence of the presence or absence of malignant disease.          Failed - Last BP in normal range    BP Readings from Last 1 Encounters:  01/27/23 (!) 152/84         Failed - Valid encounter within last 12 months    Recent Outpatient Visits           1 year ago Essential hypertension   [facility] - [facility] Dept Of [place]. [facility] Lawrance Presume, MD   1 year ago Essential hypertension   [facility] [place] [facility] - A Dept Of [place]. [employer] Lawrance Presume, MD   1 year ago Essential hypertension   [facility] [facility] [place] - A Dept Of [place]. [facility] Lawrance Presume, MD   1 year ago Need for zoster vaccination   [facility] - A Dept Of [place].  [facility] Valente Gaskin, RPH-CPP   2 years ago Essential hypertension   [facility] [place] - A Dept Of [place]. [facility] Lawrance Presume, MD               rivaroxaban  (XARELTO ) 20 MG TABS tablet 90 tablet 0    Sig: Take 1 tablet (20 mg total) by mouth daily with supper.Must have office visit for refills.     Hematology: Anticoagulants - rivaroxaban  Failed - 11/20/2023  3:49 PM      Failed - ALT in normal range and within 360 days    ALT  Date Value Ref Range Status  11/14/2022 41 0 - 44 IU/L Final         Failed - AST in normal range and within 360 days    AST  Date Value Ref Range Status  11/14/2022 28 0 - 40 IU/L Final         Failed - Valid encounter within last 12 months    Recent Outpatient Visits           1 year ago Essential hypertension   [facility] [facility] - A Dept Of [place]. [facility] Lawrance Presume, MD   1 year ago  Essential hypertension   [facility] - A Dept Of [place]. [facility] Lawrance Presume, MD   1 year ago Essential hypertension   [place] [facility] - A Dept Of [place]. [facility] Lawrance Presume, MD   1 year ago Need for zoster vaccination   [facility] [facility] - A Dept Of [place]. [facility] Valente Gaskin, RPH-CPP   2 years ago Essential hypertension   [place] [facility] [place] - A Dept Of [place]. [facility] Lawrance Presume, MD              Passed - Cr in normal range and within 360 days    Creatinine, Ser  Date Value Ref Range Status  01/27/2023 1.04 0.61 - 1.24 mg/dL Final         Passed - HCT in normal range and within 360 days    HCT  Date Value Ref Range Status  01/27/2023 47.6 39.0 - 52.0 % Final   Hematocrit  Date Value Ref Range Status  02/19/2022 46.0 37.5 - 51.0 % Final         Passed - HGB in normal range and within 360 days     Hemoglobin  Date Value Ref Range Status  01/27/2023 16.0 13.0 - 17.0 g/dL Final  40/98/1191 47.8 13.0 - 17.7 g/dL Final         Passed - PLT in normal range and within 360 days    Platelets  Date Value Ref Range Status  01/27/2023 153 150 - 400 K/uL Final  02/19/2022 154 150 - 450 x10E3/uL Final         Passed - eGFR is 15 or above and within 360 days    GFR calc Af Amer  Date Value Ref Range Status  04/01/2019 >60 >60 mL/min Final   GFR, Estimated  Date Value Ref Range Status  01/27/2023 >60 >60 mL/min Final    Comment:    (NOTE) Calculated using the CKD-EPI Creatinine Equation (2021)    eGFR  Date Value Ref Range Status  10/19/2021 100 >59 mL/min/1.73 Final         Passed - Patient is not pregnant       pregabalin  (LYRICA ) 100 MG capsule 60 capsule 3    Sig: Take 1 capsule (100 mg total) by mouth 2 (two) times daily.     Not Delegated - Neurology:  Anticonvulsants - Controlled - pregabalin  Failed - 11/20/2023  3:49 PM      Failed - This refill cannot be delegated      Failed - Completed PHQ-2 or PHQ-9 in the last 360 days      Failed - Valid encounter within last 12 months    Recent Outpatient Visits           1 year ago Essential hypertension   [facility] [place] [facility] [place] - A Dept Of [place]. [facility] Lawrance Presume, MD   1 year ago Essential hypertension   [place] [facility] [place] - A Dept Of [place]. [facility] Lawrance Presume, MD   1 year ago Essential hypertension   [place] [facility] - A Dept Of [place]. [facility] Lawrance Presume, MD   1 year ago Need for zoster vaccination   [facility] [facility] - A Dept Of [place]. [facility]  Valente Gaskin, RPH-CPP   2 years ago Essential hypertension   [place] [facility] [place] - A Dept Of [place]. [facility] Lawrance Presume, MD              Passed - Cr in normal range and within 360  days    Creatinine, Ser  Date Value Ref Range Status  01/27/2023 1.04 0.61 - 1.24 mg/dL Final          cyclobenzaprine  (FLEXERIL ) 10 MG tablet 30 tablet 1    Sig: Take 1 tablet (10 mg total) by mouth 2 (two) times daily as needed for muscle spasms.     Not Delegated - Analgesics:  Muscle Relaxants Failed - 11/20/2023  3:49 PM      Failed - This refill cannot be delegated      Failed - Valid encounter within last 6 months    Recent Outpatient Visits           1 year ago Essential hypertension   [place] [facility] [place] - A Dept Of [place]. [facility] Lawrance Presume, MD   1 year ago Essential hypertension   [place] [facility] [place] - A Dept Of [place]. [facility] Lawrance Presume, MD   1 year ago Essential hypertension   [place] [facility] [place] - A Dept Of [place]. [facility] Lawrance Presume, MD   1 year ago Need for zoster vaccination   [facility] - A Dept Of [place]. [facility] Valente Gaskin, RPH-CPP   2 years ago Essential hypertension   [place] [facility] [place] - A Dept Of [place]. [facility] Concetta Dee B, MD               amLODipine  (NORVASC ) 5 MG tablet 90 tablet 0    Sig: Take 1 tablet (5 mg total) by mouth daily.     Cardiovascular: Calcium  Channel Blockers 2 Failed - 11/20/2023  3:49 PM      Failed - Last BP in normal range    BP Readings from Last 1 Encounters:  01/27/23 (!) 152/84         Failed - Valid encounter within last 6 months    Recent Outpatient Visits           1 year ago Essential hypertension   [place] [facility] [place] - A Dept Of [place]. [facility] Lawrance Presume, MD   1 year ago Essential hypertension   [place] [facility] [place] - A Dept Of [place]. [facility] Lawrance Presume, MD   1 year ago Essential hypertension   [place] [facility] [place] - A Dept Of [place]  [facility] Lawrance Presume, MD   1 year ago Need for zoster vaccination   [place] [facility] - A Dept Of [place]. [facility] Valente Gaskin, RPH-CPP   2 years ago Essential hypertension    Comm [facility] [place] - A Dept Of . [facility] Lawrance Presume, MD              Passed - Last Heart Rate in normal range    Pulse Readings from Last 1 Encounters:  01/27/23 90          atorvastatin  (LIPITOR) 80 MG tablet 90 tablet  0    Sig: Take 1 tablet (80 mg total) by mouth daily.     Cardiovascular:  Antilipid - Statins Failed - 11/20/2023  3:49 PM      Failed - Valid encounter within last 12 months    Recent Outpatient Visits           1 year ago Essential hypertension   [place] [facility] [place] - A Dept Of [place]. [facility] Lawrance Presume, MD   1 year ago Essential hypertension   [place] [facility] - A Dept Of [place]. [facility] Lawrance Presume, MD   1 year ago Essential hypertension   [place] [facility] [place] - A Dept Of [place]. [facility] Lawrance Presume, MD   1 year ago Need for zoster vaccination   [place] [facility] - A Dept Of [place]. [facility] Valente Gaskin, RPH-CPP   2 years ago Essential hypertension   [place] [facility] [place] - A Dept Of [place]. [facility] Concetta Dee B, MD              Failed - Lipid Panel in normal range within the last 12 months    Cholesterol, Total  Date Value Ref Range Status  11/14/2022 164 100 - 199 mg/dL Final   LDL Chol Calc (NIH)  Date Value Ref Range Status  11/14/2022 103 (H) 0 - 99 mg/dL Final   HDL  Date Value Ref Range Status  11/14/2022 40 >39 mg/dL Final   Triglycerides  Date Value Ref Range Status  11/14/2022 114 0 - 149 mg/dL Final         Passed - Patient is not pregnant

## 2023-11-20 NOTE — Telephone Encounter (Signed)
 Requested medication (s) are due for refill today: Yes  Requested medication (s) are on the active medication list: Yes  Last refill:    Future visit scheduled: Ys  Notes to clinic:  Medications not delegated.    Requested Prescriptions  Pending Prescriptions Disp Refills   pregabalin  (LYRICA ) 100 MG capsule 60 capsule 3    Sig: Take 1 capsule (100 mg total) by mouth 2 (two) times daily.     Not Delegated - Neurology:  Anticonvulsants - Controlled - pregabalin  Failed - 11/20/2023  3:50 PM      Failed - This refill cannot be delegated      Failed - Completed PHQ-2 or PHQ-9 in the last 360 days      Failed - Valid encounter within last 12 months    Recent Outpatient Visits           1 year ago Essential hypertension   [facility] [place] - A Dept Of [place]. [facility] Lawrance Presume, MD   1 year ago Essential hypertension   [place] [facility] [place] - A Dept Of [place]. [facility] Lawrance Presume, MD   1 year ago Essential hypertension   [place] [facility] [place] - A Dept Of [place]. [facility] Lawrance Presume, MD   1 year ago Need for zoster vaccination   [place] [facility] - A Dept Of [place]. [facility] Valente Gaskin, RPH-CPP   2 years ago Essential hypertension   [place] [facility] [place] - A Dept Of [place]. [facility] Lawrance Presume, MD              Passed - Cr in normal range and within 360 days    Creatinine, Ser  Date Value Ref Range Status  01/27/2023 1.04 0.61 - 1.24 mg/dL Final          cyclobenzaprine  (FLEXERIL ) 10 MG tablet 30 tablet 1    Sig: Take 1 tablet (10 mg total) by mouth 2 (two) times daily as needed for muscle spasms.     Not Delegated - Analgesics:  Muscle Relaxants Failed - 11/20/2023  3:50 PM      Failed - This refill cannot be delegated      Failed - Valid encounter within last 6 months    Recent Outpatient  Visits           1 year ago Essential hypertension   [place] [facility] [place] - A Dept Of [place]. [facility] Lawrance Presume, MD   1 year ago Essential hypertension   [place] [facility] - A Dept Of [place]. [facility] Lawrance Presume, MD   1 year ago Essential hypertension   [place] [facility] [place] [facility] - A Dept Of [place]. [facility] Lawrance Presume, MD   1 year ago Need for zoster vaccination   [place] [facility] - A Dept Of [place]. [facility] Valente Gaskin, RPH-CPP   2 years ago Essential hypertension   [place] [facility] [place] - A Dept Of [place]. [facility] Lawrance Presume, MD              Signed Prescriptions Disp Refills   tamsulosin  (FLOMAX ) 0.4 MG CAPS capsule 60 capsule 6    Sig: Take 2 capsules (0.8 mg total)  by mouth daily.     Urology: Alpha-Adrenergic Blocker Failed - 11/20/2023  3:50 PM      Failed - PSA in normal range and within 360 days    Prostate Specific Ag, Serum  Date Value Ref Range Status  11/14/2022 15.8 (H) 0.0 - 4.0 ng/mL Final    Comment:    Roche ECLIA methodology. According to the American Urological Association, Serum PSA should decrease and remain at undetectable levels after radical prostatectomy. The AUA defines biochemical recurrence as an initial PSA value 0.2 ng/mL or greater followed by a subsequent confirmatory PSA value 0.2 ng/mL or greater. Values obtained with different assay methods or kits cannot be used interchangeably. Results cannot be interpreted as absolute evidence of the presence or absence of malignant disease.          Failed - Last BP in normal range    BP Readings from Last 1 Encounters:  01/27/23 (!) 152/84         Failed - Valid encounter within last 12 months    Recent Outpatient Visits           1 year ago Essential hypertension   [place] [facility] [place] - A Dept Of [place].  [facility] Lawrance Presume, MD   1 year ago Essential hypertension   [place] [facility] [place] - A Dept Of [place]. [facility] Lawrance Presume, MD   1 year ago Essential hypertension   [place] [facility] [place] - A Dept Of [place]. [facility] Lawrance Presume, MD   1 year ago Need for zoster vaccination   [place] [facility] - A Dept Of [place]. [facility] Valente Gaskin, RPH-CPP   2 years ago Essential hypertension   [place] [facility] [place] [facility] - A Dept Of [place]. [facility] Lawrance Presume, MD               rivaroxaban  (XARELTO ) 20 MG TABS tablet 90 tablet 0    Sig: Take 1 tablet (20 mg total) by mouth daily with supper.Must have office visit for refills.     Hematology: Anticoagulants - rivaroxaban  Failed - 11/20/2023  3:50 PM      Failed - ALT in normal range and within 360 days    ALT  Date Value Ref Range Status  11/14/2022 41 0 - 44 IU/L Final         Failed - AST in normal range and within 360 days    AST  Date Value Ref Range Status  11/14/2022 28 0 - 40 IU/L Final         Failed - Valid encounter within last 12 months    Recent Outpatient Visits           1 year ago Essential hypertension   [place] [facility] - A Dept Of [place]. [facility] Lawrance Presume, MD   1 year ago Essential hypertension   [place] [facility] [place] - A Dept Of [place]. [facility] Lawrance Presume, MD   1 year ago Essential hypertension   [place] [facility] [place] - A Dept Of [place]. [facility] Lawrance Presume, MD   1 year ago Need for zoster vaccination   [facility] - A Dept Of [place]. [facility] Valente Gaskin, RPH-CPP  2 years ago Essential hypertension   [place] [facility] [place] - A Dept Of [place]. [facility] Lawrance Presume, MD               Passed - Cr in normal range and within 360 days    Creatinine, Ser  Date Value Ref Range Status  01/27/2023 1.04 0.61 - 1.24 mg/dL Final         Passed - HCT in normal range and within 360 days    HCT  Date Value Ref Range Status  01/27/2023 47.6 39.0 - 52.0 % Final   Hematocrit  Date Value Ref Range Status  02/19/2022 46.0 37.5 - 51.0 % Final         Passed - HGB in normal range and within 360 days    Hemoglobin  Date Value Ref Range Status  01/27/2023 16.0 13.0 - 17.0 g/dL Final  16/03/9603 54.0 13.0 - 17.7 g/dL Final         Passed - PLT in normal range and within 360 days    Platelets  Date Value Ref Range Status  01/27/2023 153 150 - 400 K/uL Final  02/19/2022 154 150 - 450 x10E3/uL Final         Passed - eGFR is 15 or above and within 360 days    GFR calc Af Amer  Date Value Ref Range Status  04/01/2019 >60 >60 mL/min Final   GFR, Estimated  Date Value Ref Range Status  01/27/2023 >60 >60 mL/min Final    Comment:    (NOTE) Calculated using the CKD-EPI Creatinine Equation (2021)    eGFR  Date Value Ref Range Status  10/19/2021 100 >59 mL/min/1.73 Final         Passed - Patient is not pregnant       amLODipine  (NORVASC ) 5 MG tablet 90 tablet 0    Sig: Take 1 tablet (5 mg total) by mouth daily.     Cardiovascular: Calcium  Channel Blockers 2 Failed - 11/20/2023  3:50 PM      Failed - Last BP in normal range    BP Readings from Last 1 Encounters:  01/27/23 (!) 152/84         Failed - Valid encounter within last 6 months    Recent Outpatient Visits           1 year ago Essential hypertension   [place] [facility] [place] - A Dept Of [place]. [facility] Lawrance Presume, MD   1 year ago Essential hypertension   [place] [facility] [place] - A Dept Of [place]. [facility] Lawrance Presume, MD   1 year ago Essential hypertension   [place] [facility] [place] - A Dept Of [place]. [facility]  Lawrance Presume, MD   1 year ago Need for zoster vaccination   [place] [facility] - A Dept Of [place]. [facility] Valente Gaskin, RPH-CPP   2 years ago Essential hypertension   [place] [facility] Uncertain - A Dept Of . [facility] Lawrance Presume, MD              Passed - Last Heart Rate in normal range    Pulse Readings from Last 1 Encounters:  01/27/23 90          atorvastatin  (LIPITOR) 80 MG tablet 90 tablet 0    Sig: Take 1  tablet (80 mg total) by mouth daily.     Cardiovascular:  Antilipid - Statins Failed - 11/20/2023  3:50 PM      Failed - Valid encounter within last 12 months    Recent Outpatient Visits           1 year ago Essential hypertension   [place] [facility] [place] - A Dept Of [place]. [facility] Lawrance Presume, MD   1 year ago Essential hypertension   [place] [facility] [place] - A Dept Of [place]. [facility] Lawrance Presume, MD   1 year ago Essential hypertension   [place] [facility] [place] - A Dept Of [place]. [facility] Lawrance Presume, MD   1 year ago Need for zoster vaccination   [place] [facility] - A Dept Of [place]. [facility] Valente Gaskin, RPH-CPP   2 years ago Essential hypertension   [place] [facility] [place] - A Dept Of [place]. [facility] Concetta Dee B, MD              Failed - Lipid Panel in normal range within the last 12 months    Cholesterol, Total  Date Value Ref Range Status  11/14/2022 164 100 - 199 mg/dL Final   LDL Chol Calc (NIH)  Date Value Ref Range Status  11/14/2022 103 (H) 0 - 99 mg/dL Final   HDL  Date Value Ref Range Status  11/14/2022 40 >39 mg/dL Final   Triglycerides  Date Value Ref Range Status  11/14/2022 114 0 - 149 mg/dL Final         Passed - Patient is not pregnant

## 2023-11-20 NOTE — Telephone Encounter (Signed)
 Requested Prescriptions  Pending Prescriptions Disp Refills   tamsulosin  (FLOMAX ) 0.4 MG CAPS capsule 60 capsule 6    Sig: Take 2 capsules (0.8 mg total) by mouth daily.     Urology: Alpha-Adrenergic Blocker Failed - 11/20/2023  3:49 PM      Failed - PSA in normal range and within 360 days    Prostate Specific Ag, Serum  Date Value Ref Range Status  11/14/2022 15.8 (H) 0.0 - 4.0 ng/mL Final    Comment:    Roche ECLIA methodology. According to the American Urological Association, Serum PSA should decrease and remain at undetectable levels after radical prostatectomy. The AUA defines biochemical recurrence as an initial PSA value 0.2 ng/mL or greater followed by a subsequent confirmatory PSA value 0.2 ng/mL or greater. Values obtained with different assay methods or kits cannot be used interchangeably. Results cannot be interpreted as absolute evidence of the presence or absence of malignant disease.          Failed - Last BP in normal range    BP Readings from Last 1 Encounters:  01/27/23 (!) 152/84         Failed - Valid encounter within last 12 months    Recent Outpatient Visits           1 year ago Essential hypertension   [facility] Dept Of [place]. [facility] Lawrance Presume, MD   1 year ago Essential hypertension   [facility] - [facility] Dept Of [place]. [facility] Lawrance Presume, MD   1 year ago Essential hypertension   [facility] - [facility] Dept Of [place]. [facility] Lawrance Presume, MD   1 year ago Need for zoster vaccination   [facility] Of [place]. [facility] Valente Gaskin, RPH-CPP   2 years ago Essential hypertension   [facility] [place] - A Dept Of [place]. [facility] Concetta Dee B, MD               rivaroxaban  (XARELTO ) 20 MG TABS tablet 90 tablet 0    Sig: Take 1 tablet (20 mg  total) by mouth daily with supper.Must have office visit for refills.     Hematology: Anticoagulants - rivaroxaban  Failed - 11/20/2023  3:49 PM      Failed - ALT in normal range and within 360 days    ALT  Date Value Ref Range Status  11/14/2022 41 0 - 44 IU/L Final         Failed - AST in normal range and within 360 days    AST  Date Value Ref Range Status  11/14/2022 28 0 - 40 IU/L Final         Failed - Valid encounter within last 12 months    Recent Outpatient Visits           1 year ago Essential hypertension   [facility] - [facility] Dept Of [place]. [facility] Lawrance Presume, MD   1 year ago Essential hypertension   [facility] - A Dept Of [place]. [facility] Lawrance Presume, MD   1 year ago Essential hypertension   [facility] [place] - A Dept Of [place]. [facility] Lawrance Presume, MD   1 year  ago Need for zoster vaccination   [facility] [place] - [facility] Dept Of [place]. [facility] Valente Gaskin, RPH-CPP   2 years ago Essential hypertension   [facility] - A Dept Of [place]. [facility] Lawrance Presume, MD              Passed - Cr in normal range and within 360 days    Creatinine, Ser  Date Value Ref Range Status  01/27/2023 1.04 0.61 - 1.24 mg/dL Final         Passed - HCT in normal range and within 360 days    HCT  Date Value Ref Range Status  01/27/2023 47.6 39.0 - 52.0 % Final   Hematocrit  Date Value Ref Range Status  02/19/2022 46.0 37.5 - 51.0 % Final         Passed - HGB in normal range and within 360 days    Hemoglobin  Date Value Ref Range Status  01/27/2023 16.0 13.0 - 17.0 g/dL Final  91/47/8295 62.1 13.0 - 17.7 g/dL Final         Passed - PLT in normal range and within 360 days    Platelets  Date Value Ref Range Status  01/27/2023 153 150 - 400 K/uL Final  02/19/2022 154 150 - 450  [id number]/uL Final         Passed - eGFR is 15 or above and within 360 days    GFR calc Af Amer  Date Value Ref Range Status  04/01/2019 >60 >60 mL/min Final   GFR, Estimated  Date Value Ref Range Status  01/27/2023 >60 >60 mL/min Final    Comment:    (NOTE) Calculated using the CKD-EPI Creatinine Equation (2021)    eGFR  Date Value Ref Range Status  10/19/2021 100 >59 mL/min/1.73 Final         Passed - Patient is not pregnant       pregabalin  (LYRICA ) 100 MG capsule 60 capsule 3    Sig: Take 1 capsule (100 mg total) by mouth 2 (two) times daily.     Not Delegated - Neurology:  Anticonvulsants - Controlled - pregabalin  Failed - 11/20/2023  3:49 PM      Failed - This refill cannot be delegated      Failed - Completed PHQ-2 or PHQ-9 in the last 360 days      Failed - Valid encounter within last 12 months    Recent Outpatient Visits           1 year ago Essential hypertension   [facility] Dept Of [place]. [facility] Lawrance Presume, MD   1 year ago Essential hypertension   [facility] [place] - [facility] Dept Of [place]. [facility] Lawrance Presume, MD   1 year ago Essential hypertension   [facility] [place] - [facility] Dept Of [place]. [facility] Lawrance Presume, MD   1 year ago Need for zoster vaccination   [facility] - A Dept Of [place]. [facility] Valente Gaskin, RPH-CPP   2 years ago Essential hypertension   [facility] - A Dept Of . [facility] Lawrance Presume, MD              Passed - Cr in normal range and within  360 days    Creatinine, Ser  Date Value Ref Range Status  01/27/2023 1.04 0.61 - 1.24 mg/dL Final          cyclobenzaprine  (FLEXERIL ) 10 MG tablet 30 tablet 1    Sig: Take 1 tablet (10 mg total) by mouth 2 (two) times daily as needed for muscle spasms.     Not Delegated - Analgesics:   Muscle Relaxants Failed - 11/20/2023  3:49 PM      Failed - This refill cannot be delegated      Failed - Valid encounter within last 6 months    Recent Outpatient Visits           1 year ago Essential hypertension   [facility] - [facility] Dept Of [place]. [facility] Lawrance Presume, MD   1 year ago Essential hypertension   [facility] - A Dept Of [place]. [facility] Lawrance Presume, MD   1 year ago Essential hypertension   [facility] [place] - A Dept Of [place]. [facility] Lawrance Presume, MD   1 year ago Need for zoster vaccination   [facility] - A Dept Of [place]. [facility] Valente Gaskin, RPH-CPP   2 years ago Essential hypertension   [facility] - A Dept Of [place]. [facility] Concetta Dee B, MD               amLODipine  (NORVASC ) 5 MG tablet 90 tablet 0    Sig: Take 1 tablet (5 mg total) by mouth daily.     Cardiovascular: Calcium  Channel Blockers 2 Failed - 11/20/2023  3:49 PM      Failed - Last BP in normal range    BP Readings from Last 1 Encounters:  01/27/23 (!) 152/84         Failed - Valid encounter within last 6 months    Recent Outpatient Visits           1 year ago Essential hypertension   [facility] [place] - [facility] Dept Of [place]. [facility] Lawrance Presume, MD   1 year ago Essential hypertension   [facility] [place] - A Dept Of [place]. [facility] Lawrance Presume, MD   1 year ago Essential hypertension   [facility] [place] [facility] [place] [facility] - A Dept Of [place]. [facility] Lawrance Presume, MD   1 year ago Need for zoster vaccination   [facility] - A Dept Of [place]. [facility] Valente Gaskin, RPH-CPP   2 years ago Essential hypertension   [facility] [facility] [place] - A Dept Of [place]  [facility]. [facility] Lawrance Presume, MD              Passed - Last Heart Rate in normal range    Pulse Readings from Last 1 Encounters:  01/27/23 90          atorvastatin  (LIPITOR) 80 MG tablet 90 tablet 0    Sig: Take 1 tablet (80 mg total) by mouth daily.     Cardiovascular:  Antilipid - Statins Failed - 11/20/2023  3:49 PM      Failed - Valid encounter within last 12 months    Recent Outpatient Visits  1 year ago Essential hypertension   [facility] [place] - [facility] Dept Of [place]. [facility] Lawrance Presume, MD   1 year ago Essential hypertension   [facility] - A Dept Of [place]. [facility] Lawrance Presume, MD   1 year ago Essential hypertension   [place] [facility] - A Dept Of [place]. [facility] Lawrance Presume, MD   1 year ago Need for zoster vaccination   [facility] - A Dept Of [place]. [facility] Valente Gaskin, RPH-CPP   2 years ago Essential hypertension   [facility] - A Dept Of [place]. [facility] Concetta Dee B, MD              Failed - Lipid Panel in normal range within the last 12 months    Cholesterol, Total  Date Value Ref Range Status  11/14/2022 164 100 - 199 mg/dL Final   LDL Chol Calc (NIH)  Date Value Ref Range Status  11/14/2022 103 (H) 0 - 99 mg/dL Final   HDL  Date Value Ref Range Status  11/14/2022 40 >39 mg/dL Final   Triglycerides  Date Value Ref Range Status  11/14/2022 114 0 - 149 mg/dL Final         Passed - Patient is not pregnant

## 2023-12-01 ENCOUNTER — Other Ambulatory Visit: Payer: Self-pay

## 2023-12-22 ENCOUNTER — Other Ambulatory Visit: Payer: Self-pay

## 2024-01-09 ENCOUNTER — Telehealth: Payer: Self-pay | Admitting: Internal Medicine

## 2024-01-09 NOTE — Telephone Encounter (Signed)
 Called patient to confirm upcoming appointment 01/13/2024 at 9:30 am with Dr. Vicci. Patient appointment has been successfully confirmed

## 2024-01-13 ENCOUNTER — Telehealth: Payer: Self-pay | Admitting: Internal Medicine

## 2024-01-13 ENCOUNTER — Ambulatory Visit: Admitting: Internal Medicine

## 2024-01-13 NOTE — Telephone Encounter (Signed)
 Copied from CRM [id number]. Topic: Clinical - Medication Question >> Jan 13, 2024  8:13 AM Vena H wrote:  Reason for CRM: Pt forgot to call and get transportation set up so he could come to this appointment, so pt had to reschedule to the next available on 9/5 and was added to wait list. Pt is wanting to know if it is possible for him to still have his refills sent in for all his medications. Please reach out

## 2024-01-14 NOTE — Telephone Encounter (Signed)
 Refills were sent the end of May to our pharmacy that he has not picked up as yet.

## 2024-01-14 NOTE — Telephone Encounter (Signed)
 Called & spoke to the patient. Verified name & DOB. Informed that there are remaining refills at our pharmacy. Advised to call pharmacy to request refills. Rescheduled September appointment to 01/19/2024 to address all other concerns. Patient expressed verbal understanding.

## 2024-01-16 ENCOUNTER — Telehealth: Payer: Self-pay | Admitting: Internal Medicine

## 2024-01-16 NOTE — Telephone Encounter (Signed)
 Called pt to confirm appt for 7/28

## 2024-01-19 ENCOUNTER — Ambulatory Visit: Attending: Internal Medicine | Admitting: Internal Medicine

## 2024-01-19 ENCOUNTER — Encounter: Payer: Self-pay | Admitting: Internal Medicine

## 2024-01-19 ENCOUNTER — Other Ambulatory Visit: Payer: Self-pay

## 2024-01-19 VITALS — BP 137/74 | HR 74 | Temp 97.8°F | Ht 73.0 in | Wt 203.0 lb

## 2024-01-19 DIAGNOSIS — R972 Elevated prostate specific antigen [PSA]: Secondary | ICD-10-CM

## 2024-01-19 DIAGNOSIS — Z1211 Encounter for screening for malignant neoplasm of colon: Secondary | ICD-10-CM

## 2024-01-19 DIAGNOSIS — I10 Essential (primary) hypertension: Secondary | ICD-10-CM

## 2024-01-19 DIAGNOSIS — R634 Abnormal weight loss: Secondary | ICD-10-CM

## 2024-01-19 DIAGNOSIS — R350 Frequency of micturition: Secondary | ICD-10-CM

## 2024-01-19 DIAGNOSIS — N401 Enlarged prostate with lower urinary tract symptoms: Secondary | ICD-10-CM | POA: Diagnosis not present

## 2024-01-19 DIAGNOSIS — I82409 Acute embolism and thrombosis of unspecified deep veins of unspecified lower extremity: Secondary | ICD-10-CM | POA: Diagnosis not present

## 2024-01-19 DIAGNOSIS — F1290 Cannabis use, unspecified, uncomplicated: Secondary | ICD-10-CM

## 2024-01-19 DIAGNOSIS — E782 Mixed hyperlipidemia: Secondary | ICD-10-CM

## 2024-01-19 DIAGNOSIS — R7303 Prediabetes: Secondary | ICD-10-CM

## 2024-01-19 DIAGNOSIS — F1410 Cocaine abuse, uncomplicated: Secondary | ICD-10-CM

## 2024-01-19 DIAGNOSIS — Z139 Encounter for screening, unspecified: Secondary | ICD-10-CM

## 2024-01-19 DIAGNOSIS — Z595 Extreme poverty: Secondary | ICD-10-CM

## 2024-01-19 DIAGNOSIS — Z5941 Food insecurity: Secondary | ICD-10-CM

## 2024-01-19 LAB — POCT GLYCOSYLATED HEMOGLOBIN (HGB A1C): HbA1c, POC (prediabetic range): 5.8 % (ref 5.7–6.4)

## 2024-01-19 LAB — GLUCOSE, POCT (MANUAL RESULT ENTRY): POC Glucose: 119 mg/dL — AB (ref 70–99)

## 2024-01-19 MED ORDER — ATORVASTATIN CALCIUM 80 MG PO TABS
80.0000 mg | ORAL_TABLET | Freq: Every day | ORAL | 1 refills | Status: AC
  Filled 2024-01-19: qty 90, 90d supply, fill #0

## 2024-01-19 MED ORDER — RIVAROXABAN 20 MG PO TABS
20.0000 mg | ORAL_TABLET | Freq: Every day | ORAL | 0 refills | Status: AC
  Filled 2024-01-19 – 2024-04-02 (×2): qty 90, 90d supply, fill #0

## 2024-01-19 MED ORDER — AMLODIPINE BESYLATE 5 MG PO TABS
5.0000 mg | ORAL_TABLET | Freq: Every day | ORAL | 1 refills | Status: AC
  Filled 2024-01-19: qty 90, 90d supply, fill #0

## 2024-01-19 NOTE — Patient Instructions (Signed)
 VISIT SUMMARY:  Today, we discussed your current health status and addressed several important issues related to your medications and overall health. We reviewed your history of hypertension, recurrent blood clots, benign prostatic hyperplasia, hyperlipidemia, prediabetes, and substance use. We also talked about general health maintenance and the importance of regular screenings.  YOUR PLAN:  -RECURRENT VENOUS THROMBOEMBOLISM: This condition involves the formation of blood clots in the veins that can recur if not properly managed. You need to restart taking Xarelto  as prescribed to prevent new clots from forming.  -HYPERTENSION: Hypertension, or high blood pressure, can lead to serious health problems if not controlled. You should restart taking amlodipine  5 mg daily to help manage your blood pressure.  -BENIGN PROSTATIC HYPERPLASIA (BPH): BPH is an enlargement of the prostate gland that can cause urinary symptoms. We will recheck your PSA levels with blood tests, and you should follow up with your urologist for further evaluation.  -HYPERLIPIDEMIA: Hyperlipidemia means having high levels of fats (lipids) in your blood, which can increase the risk of heart disease. It is important to adhere to your cholesterol medication to manage this condition.  -PREDIABETES: Prediabetes is a condition where blood sugar levels are higher than normal but not yet high enough to be diagnosed as diabetes. You should avoid sugary drinks, increase your water intake, and incorporate more fresh fruits and vegetables into your diet to prevent progression to diabetes.  -SUBSTANCE USE: Using substances like marijuana and cocaine can have serious health risks, especially when taking blood thinners. It is advised to stop using these substances and consider a treatment program if needed.  -GENERAL HEALTH MAINTENANCE: We discussed the importance of regular health screenings. Since your colonoscopy was incomplete, we will use a  Cologuard kit for colorectal cancer screening.  INSTRUCTIONS:  Please have your blood tests done today. Schedule a follow-up appointment in 3-4 months or sooner if any problems arise.

## 2024-01-19 NOTE — Progress Notes (Signed)
 Patient ID: Keith Potter, male    DOB: 1965-11-22  MRN: [id number]  CC: Hypertension (HTN f/u. Med refills. /No questions / concerns/Yes to colonoscopy)   Subjective: Keith Potter is a 58 y.o. male who presents for chronic ds management. 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  History of Present Illness Keith Potter is a 59 year old male who presents for follow-up.  HTN: He has a history of hypertension and was previously prescribed amlodipine  5 mg daily, which he has not taken for the past six to seven months due to running out of it. He does not monitor his blood pressure at home but limits his salt intake significantly. No chest pain or shortness of breath.  Recurrent clots/HL: He has a history of recurrent blood clots and was on Xarelto , which he has not taken for the past six months. Additionally, he has not been taking his cholesterol medication, atorvastatin , during this period.  BPH/elev PSA: He was on Flomax , which he has not taken for six months. He experiences frequent urination, approximately every fifteen to twenty minutes at night, with a slow urine flow and difficulty initiating urination. He has a history of elevated PSA and has not seen his urologist since his last visit. Had neg bx in 2023.  PreDM: Results for orders placed or performed in visit on 01/19/24  POCT glucose (manual entry)   Collection Time: 01/19/24  9:13 AM  Result Value Ref Range   POC Glucose 119 (A) 70 - 99 mg/dl  POCT glycosylated hemoglobin (Hb A1C)   Collection Time: 01/19/24  9:21 AM  Result Value Ref Range   Hemoglobin A1C      HbA1c POC (<> result, manual entry)     HbA1c, POC (prediabetic range) 5.8 5.7 - 6.4 %   HbA1c, POC (controlled diabetic range)    His prediabetes is being monitored, with a recent A1c of 5.8 and a fasting blood sugar of 119 mg/dL. He has been focusing on weight loss by reducing intake of salt, sodas, and red meats, and consuming more fish, [place], and chicken. Loss 13 lbs since last visit with me May 2024. Denies any palpitations or feeling hot all the time.       Patient Active Problem List   Diagnosis Date Noted   Thrombophilia (HCC) 07/11/2022   Prediabetes 05/12/2021   Elevated PSA 05/12/2021   Former smoker 05/05/2021   Essential hypertension 05/05/2021   Tobacco dependence 04/23/2019   Marijuana user 04/23/2019   Rectal bleed 04/23/2019   History of pulmonary embolism 04/23/2019   DVT (deep venous thrombosis) (HCC) 03/31/2019   Hyperlipidemia 01/10/2019   Healthcare maintenance 01/10/2019   History of DVT (deep vein thrombosis) 12/28/2018   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   Chronic pain of toe, right 06/06/2016   Deformity of toe, right 06/06/2016   Increased frequency of urination 06/09/2013   Nocturia 06/09/2013     Current Outpatient Medications on File Prior to Visit  Medication Sig Dispense Refill   Ascorbic Acid (VITAMIN C) 1000 MG tablet Take 1,000 mg by mouth daily.     Multiple Vitamin (  MULTIVITAMIN WITH MINERALS) TABS tablet Take 1 tablet by mouth daily.     pregabalin  (LYRICA ) 100 MG capsule Take 1 capsule (100 mg total) by mouth 2 (two) times daily. 60 capsule 3   sildenafil  (VIAGRA ) 100 MG tablet Take 0.5-1 tablets (50-100 mg total) by mouth as needed on an empty stomach, 30 minutes to 1 hour prior to anticipated activities. 30 tablet 3   tamsulosin  (FLOMAX ) 0.4 MG CAPS capsule Take 2 capsules (0.8 mg total) by mouth daily. 60 capsule 6   No current facility-administered medications on file prior to visit.    No Known Allergies  Social  History   Socioeconomic History   Marital status: Single    Spouse name: Not on file   Number of children: Not on file   Years of education: Not on file   Highest education level: Not on file  Occupational History   Not on file  Tobacco Use   Smoking status: Former    Current packs/day: 0.00    Types: Cigarettes    Quit date: 01/31/2021    Years since quitting: 2.9   Smokeless tobacco: Never  Vaping Use   Vaping status: Never Used  Substance and Sexual Activity   Alcohol use: Not Currently   Drug use: Yes    Types: Marijuana   Sexual activity: Yes    Partners: Female    Birth control/protection: Condom  Other Topics Concern   Not on file  Social History Narrative   Not on file   Social Drivers of Health   Financial Resource Strain: High Risk (01/19/2024)   Overall Financial Resource Strain (CARDIA)    Difficulty of Paying Living Expenses: Hard  Food Insecurity: Food Insecurity Present (01/19/2024)   Hunger Vital Sign    Worried About Running Out of Food in the Last Year: Sometimes true    Ran Out of Food in the Last Year: Often true  Transportation Needs: No Transportation Needs (01/19/2024)   PRAPARE - Administrator, Civil Service (Medical): No    Lack of Transportation (Non-Medical): No  Physical Activity: Inactive (01/19/2024)   Exercise Vital Sign    Days of Exercise per Week: 0 days    Minutes of Exercise per Session: 0 min  Stress: Stress Concern Present (01/19/2024)   Keith Potter [employer] Occupational [employer] - Occupational Stress Questionnaire    Feeling of Stress: Rather much  Social Connections: Socially Isolated (01/19/2024)   Social Connection and Isolation Panel    Frequency of Communication with Friends and Family: Once a week    Frequency of Social Gatherings with Friends and Family: Never    Attends Religious Services: Never    Database administrator or Organizations: Yes    Attends Banker Meetings: Never    Marital Status:  Never married  Intimate Partner Violence: Not At Risk (01/19/2024)   Humiliation, Afraid, Rape, and Kick questionnaire    Fear of Current or Ex-Partner: No    Emotionally Abused: No    Physically Abused: No    Sexually Abused: No    Family History  Problem Relation Age of Onset   Diabetes Mother    Diabetes Father     Past Surgical History:  Procedure Laterality Date   AMPUTATION TOE     APPENDECTOMY     HERNIA REPAIR      ROS: Review of Systems Negative except as stated above  PHYSICAL EXAM: BP 137/74   Pulse 74   Temp 97.8  F (36.6 C) (Oral)   Ht 6' 1 (1.854 m)   Wt 203 lb (92.1 kg)   SpO2 99%   BMI 26.78 kg/m   Wt Readings from Last 3 Encounters:  01/19/24 203 lb (92.1 kg)  01/27/23 230 lb (104.3 kg)  11/14/22 216 lb (98 kg)    Physical Exam   General appearance - alert, older AAM and in no distress Mental status - pt appears mellow, with smell of weed Mouth - mucous membranes moist, pharynx normal without lesions Neck - supple, no significant adenopathy Eyes: conjunctival injection Chest - clear to auscultation, no wheezes, rales or rhonchi, symmetric air entry Heart - normal rate, regular rhythm, normal S1, S2, no murmurs, rubs, clicks or gallops Extremities - peripheral pulses normal, no pedal edema, no clubbing or cyanosis     Latest Ref Rng & Units 01/27/2023    7:58 PM 11/14/2022   10:14 AM 07/11/2022   10:22 AM  CMP  Glucose 70 - 99 mg/dL 851     BUN 6 - 20 mg/dL 12     Creatinine 9.38 - 1.24 mg/dL 8.95     Sodium 864 - 854 mmol/L 135     Potassium 3.5 - 5.1 mmol/L 3.3     Chloride 98 - 111 mmol/L 102     CO2 22 - 32 mmol/L 21     Calcium  8.9 - 10.3 mg/dL 9.2     Total Protein 6.0 - 8.5 g/dL  7.1  7.3   Total Bilirubin 0.0 - 1.2 mg/dL  0.5  0.3   Alkaline Phos 44 - 121 IU/L  118  110   AST 0 - 40 IU/L  28  34   ALT 0 - 44 IU/L  41  50    Lipid Panel     Component Value Date/Time   CHOL 164 11/14/2022 1014   TRIG 114 11/14/2022 1014    HDL 40 11/14/2022 1014   CHOLHDL 4.1 11/14/2022 1014   CHOLHDL 5.5 12/28/2018 2042   VLDL 15 12/28/2018 2042   LDLCALC 103 (H) 11/14/2022 1014    CBC    Component Value Date/Time   WBC 11.4 (H) 01/27/2023 1958   RBC 5.21 01/27/2023 1958   HGB 16.0 01/27/2023 1958   HGB 15.5 02/19/2022 0941   HCT 47.6 01/27/2023 1958   HCT 46.0 02/19/2022 0941   PLT 153 01/27/2023 1958   PLT 154 02/19/2022 0941   MCV 91.4 01/27/2023 1958   MCV 91 02/19/2022 0941   MCH 30.7 01/27/2023 1958   MCHC 33.6 01/27/2023 1958   RDW 12.4 01/27/2023 1958   RDW 12.4 02/19/2022 0941   LYMPHSABS 2.6 09/22/2021 1605   MONOABS 0.9 09/22/2021 1605   EOSABS 0.1 09/22/2021 1605   BASOSABS 0.1 09/22/2021 1605    ASSESSMENT AND PLAN: 1. Essential hypertension (Primary) Repeat blood pressure not at goal.  He will restart amlodipine  5 mg daily.  DASH diet encouraged - amLODipine  (NORVASC ) 5 MG tablet; Take 1 tablet (5 mg total) by mouth daily.  Dispense: 90 tablet; Refill: 1 - CBC - Comprehensive metabolic panel with GFR - Lipid panel  2. Recurrent acute deep vein thrombosis (DVT) of lower extremity, unspecified laterality (HCC) Patient has already picked up a refill on Xarelto .  I would have like to change him to Eliquis 2.5 mg BID instead.  Will do so in the future. Discussed importance of avoiding cocaine use - rivaroxaban  (XARELTO ) 20 MG TABS tablet; Take 1 tablet (20 mg total)  by mouth daily with supper.Must have office visit for refills.  Dispense: 90 tablet; Refill: 0  3. Prediabetes Commended him on dietary changes and encouraged to continue - POCT glycosylated hemoglobin (Hb A1C) - POCT glucose (manual entry)  4. Benign prostatic hyperplasia with urinary frequency RF Flomax   5. Elevated PSA - PSA  6. Weight loss Pt reports this is intentional but will still check thyroid level and get him up to date with age appropriate cancer screening - TSH+T4F+T3Free  7. Cocaine use disorder (HCC) 8.  Marijuana user Advise to quit Discussed referral to out-pt treatment program. Pt declines.  9. Mixed hyperlipidemia RF Atorvastatin   10. Encounter for screening involving social determinants of health (SDoH) - AMB Referral VBCI Care Management  11. Screening for colon cancer Tried to get him to GI for colonoscopy unsuccessfully. He is agreeable to doing cologuard instead. - Cologuard   Patient was given the opportunity to ask questions.  Patient verbalized understanding of the plan and was able to repeat key elements of the plan.   This documentation was completed using Paediatric nurse.  Any transcriptional errors are unintentional.  Orders Placed This Encounter  Procedures   Cologuard   CBC   Comprehensive metabolic panel with GFR   Lipid panel   PSA   TSH+T4F+T3Free   AMB Referral VBCI Care Management   POCT glycosylated hemoglobin (Hb A1C)   POCT glucose (manual entry)     Requested Prescriptions   Signed Prescriptions Disp Refills   amLODipine  (NORVASC ) 5 MG tablet 90 tablet 1    Sig: Take 1 tablet (5 mg total) by mouth daily.   atorvastatin  (LIPITOR) 80 MG tablet 90 tablet 1    Sig: Take 1 tablet (80 mg total) by mouth daily.   rivaroxaban  (XARELTO ) 20 MG TABS tablet 90 tablet 0    Sig: Take 1 tablet (20 mg total) by mouth daily with supper.Must have office visit for refills.    Return in about 4 months (around 05/21/2024).  Barnie Louder, MD, FACP

## 2024-01-20 LAB — CBC
Hematocrit: 50.4 % (ref 37.5–51.0)
Hemoglobin: 16.8 g/dL (ref 13.0–17.7)
MCH: 31.6 pg (ref 26.6–33.0)
MCHC: 33.3 g/dL (ref 31.5–35.7)
MCV: 95 fL (ref 79–97)
Platelets: 176 x10E3/uL (ref 150–450)
RBC: 5.31 x10E6/uL (ref 4.14–5.80)
RDW: 12.5 % (ref 11.6–15.4)
WBC: 9.8 x10E3/uL (ref 3.4–10.8)

## 2024-01-20 LAB — COMPREHENSIVE METABOLIC PANEL WITH GFR
ALT: 18 IU/L (ref 0–44)
AST: 22 IU/L (ref 0–40)
Albumin: 4.3 g/dL (ref 3.8–4.9)
Alkaline Phosphatase: 153 IU/L — ABNORMAL HIGH (ref 44–121)
BUN/Creatinine Ratio: 13 (ref 10–24)
BUN: 16 mg/dL (ref 8–27)
Bilirubin Total: 0.3 mg/dL (ref 0.0–1.2)
CO2: 21 mmol/L (ref 20–29)
Calcium: 9.6 mg/dL (ref 8.6–10.2)
Chloride: 103 mmol/L (ref 96–106)
Creatinine, Ser: 1.23 mg/dL (ref 0.76–1.27)
Globulin, Total: 3.1 g/dL (ref 1.5–4.5)
Glucose: 87 mg/dL (ref 70–99)
Potassium: 4.7 mmol/L (ref 3.5–5.2)
Sodium: 140 mmol/L (ref 134–144)
Total Protein: 7.4 g/dL (ref 6.0–8.5)
eGFR: 67 mL/min/1.73 (ref >59)

## 2024-01-20 LAB — TSH+T4F+T3FREE
Free T4: 1.05 ng/dL (ref 0.82–1.77)
T3, Free: 3 pg/mL (ref 2.0–4.4)
TSH: 0.409 u[IU]/mL — ABNORMAL LOW (ref 0.450–4.500)

## 2024-01-20 LAB — LIPID PANEL
Chol/HDL Ratio: 5.5 ratio — ABNORMAL HIGH (ref 0.0–5.0)
Cholesterol, Total: 240 mg/dL — ABNORMAL HIGH (ref 100–199)
HDL: 44 mg/dL (ref >39)
LDL Chol Calc (NIH): 184 mg/dL — ABNORMAL HIGH (ref 0–99)
Triglycerides: 71 mg/dL (ref 0–149)
VLDL Cholesterol Cal: 12 mg/dL (ref 5–40)

## 2024-01-20 LAB — PSA: Prostate Specific Ag, Serum: 29.9 ng/mL — ABNORMAL HIGH (ref 0.0–4.0)

## 2024-01-21 ENCOUNTER — Ambulatory Visit: Payer: Self-pay | Admitting: Internal Medicine

## 2024-01-21 DIAGNOSIS — R972 Elevated prostate specific antigen [PSA]: Secondary | ICD-10-CM

## 2024-01-21 NOTE — Telephone Encounter (Addendum)
 Phone call placed to patient this morning to go over lab results.  I left a voicemail message informing of who I am and requesting that he call us  back to discuss lab results.  Please refer to my lab note when pt calls back. Message sent to my CMA to call with lab results:  PSA level which is a screening test for prostate cancer is significantly elevated has increased compared to 1 year ago.  We need to get him back in with the urologist.  I have submitted a referral to [facility]. Kidney function is good. Liver function good except for mild elevation in one of his liver function tests.  We will plan to recheck on subsequent visit. Cholesterol level very elevated.  I refilled the atorvastatin  on his recent visit. Blood cell counts are normal.

## 2024-02-27 ENCOUNTER — Ambulatory Visit: Admitting: Internal Medicine

## 2024-03-03 ENCOUNTER — Other Ambulatory Visit: Payer: Self-pay

## 2024-03-03 MED ORDER — TAMSULOSIN HCL 0.4 MG PO CAPS
0.8000 mg | ORAL_CAPSULE | Freq: Every day | ORAL | 3 refills | Status: AC
  Filled 2024-03-03 – 2024-04-02 (×2): qty 180, 90d supply, fill #0

## 2024-03-03 MED ORDER — SILDENAFIL CITRATE 100 MG PO TABS
100.0000 mg | ORAL_TABLET | Freq: Every day | ORAL | 11 refills | Status: AC
  Filled 2024-03-03 – 2024-04-02 (×2): qty 30, 30d supply, fill #0

## 2024-03-04 ENCOUNTER — Encounter: Payer: Self-pay | Admitting: Urology

## 2024-03-04 ENCOUNTER — Other Ambulatory Visit: Payer: Self-pay | Admitting: Urology

## 2024-03-04 DIAGNOSIS — R972 Elevated prostate specific antigen [PSA]: Secondary | ICD-10-CM

## 2024-03-11 ENCOUNTER — Other Ambulatory Visit: Payer: Self-pay

## 2024-03-12 ENCOUNTER — Other Ambulatory Visit: Payer: Self-pay

## 2024-04-02 ENCOUNTER — Other Ambulatory Visit: Payer: Self-pay

## 2024-05-05 ENCOUNTER — Inpatient Hospital Stay: Admission: RE | Admit: 2024-05-05 | Source: Ambulatory Visit

## 2024-05-24 ENCOUNTER — Ambulatory Visit: Admitting: Internal Medicine

## 2024-05-30 ENCOUNTER — Other Ambulatory Visit

## 2024-07-16 ENCOUNTER — Ambulatory Visit: Admitting: Internal Medicine

## 2024-07-19 ENCOUNTER — Telehealth: Payer: Self-pay

## 2024-07-19 NOTE — Telephone Encounter (Signed)
 Can we check [employer] for the form.      Copied from CRM #[id number]. Topic: General - Other >> Jul 19, 2024 10:53 AM Avram MATSU wrote: Reason for CRM: Delon called to check if the office got the faxed for blood pressure monitor

## 2024-07-20 NOTE — Telephone Encounter (Signed)
 Noted.

## 2024-07-20 NOTE — Telephone Encounter (Signed)
 FYI

## 2024-07-21 NOTE — Telephone Encounter (Signed)
 Form will be left on your desk. Please make sure that pt requested this.

## 2024-07-22 NOTE — Telephone Encounter (Signed)
 Called but no answer. Unable to call back.

## 2024-07-23 NOTE — Telephone Encounter (Signed)
 Called but no answer. Unable to LVM.  Form faxed in in case patient is awaiting BP monitor. Successfully faxed on 07/23/2024 to [facility]. Fax #: [phone].

## 2024-08-16 ENCOUNTER — Ambulatory Visit: Admitting: Internal Medicine
